# Patient Record
Sex: Female | Born: 1955 | Hispanic: Yes | Marital: Married | Smoking: Never smoker
Health system: Southern US, Community
[De-identification: ages and names within clinical notes are randomized; demographics above are authoritative.]

## PROBLEM LIST (undated history)

## (undated) DIAGNOSIS — I10 Essential (primary) hypertension: Secondary | ICD-10-CM

## (undated) DIAGNOSIS — E119 Type 2 diabetes mellitus without complications: Secondary | ICD-10-CM

## (undated) DIAGNOSIS — M1990 Unspecified osteoarthritis, unspecified site: Secondary | ICD-10-CM

## (undated) DIAGNOSIS — J45909 Unspecified asthma, uncomplicated: Secondary | ICD-10-CM

---

## 2014-11-21 ENCOUNTER — Ambulatory Visit: Payer: Self-pay | Admitting: Family Medicine

## 2014-11-23 ENCOUNTER — Encounter: Payer: Self-pay | Admitting: Family Medicine

## 2014-11-23 ENCOUNTER — Ambulatory Visit (INDEPENDENT_AMBULATORY_CARE_PROVIDER_SITE_OTHER): Payer: BLUE CROSS/BLUE SHIELD | Admitting: Family Medicine

## 2014-11-23 VITALS — BP 124/70 | HR 72 | Ht 61.0 in | Wt 174.6 lb

## 2014-11-23 DIAGNOSIS — R1031 Right lower quadrant pain: Secondary | ICD-10-CM

## 2014-11-23 DIAGNOSIS — J4530 Mild persistent asthma, uncomplicated: Secondary | ICD-10-CM

## 2014-11-23 DIAGNOSIS — E1129 Type 2 diabetes mellitus with other diabetic kidney complication: Secondary | ICD-10-CM | POA: Insufficient documentation

## 2014-11-23 DIAGNOSIS — F32A Depression, unspecified: Secondary | ICD-10-CM | POA: Insufficient documentation

## 2014-11-23 DIAGNOSIS — E785 Hyperlipidemia, unspecified: Secondary | ICD-10-CM | POA: Diagnosis not present

## 2014-11-23 DIAGNOSIS — K219 Gastro-esophageal reflux disease without esophagitis: Secondary | ICD-10-CM | POA: Diagnosis not present

## 2014-11-23 DIAGNOSIS — E119 Type 2 diabetes mellitus without complications: Secondary | ICD-10-CM | POA: Diagnosis not present

## 2014-11-23 DIAGNOSIS — R05 Cough: Secondary | ICD-10-CM

## 2014-11-23 DIAGNOSIS — F329 Major depressive disorder, single episode, unspecified: Secondary | ICD-10-CM

## 2014-11-23 DIAGNOSIS — E669 Obesity, unspecified: Secondary | ICD-10-CM

## 2014-11-23 DIAGNOSIS — R809 Proteinuria, unspecified: Secondary | ICD-10-CM

## 2014-11-23 DIAGNOSIS — K5900 Constipation, unspecified: Secondary | ICD-10-CM | POA: Diagnosis not present

## 2014-11-23 DIAGNOSIS — I10 Essential (primary) hypertension: Secondary | ICD-10-CM | POA: Diagnosis not present

## 2014-11-23 DIAGNOSIS — R053 Chronic cough: Secondary | ICD-10-CM

## 2014-11-23 MED ORDER — ESCITALOPRAM OXALATE 10 MG PO TABS: 10.0000 mg | ORAL_TABLET | Freq: Every day | ORAL | Status: DC

## 2014-11-23 MED ORDER — ALBUTEROL SULFATE HFA 108 (90 BASE) MCG/ACT IN AERS: 2.0000 | INHALATION_SPRAY | RESPIRATORY_TRACT | Status: DC | PRN

## 2014-11-23 MED ORDER — FLUTICASONE-SALMETEROL 250-50 MCG/DOSE IN AEPB: 1.0000 | INHALATION_SPRAY | Freq: Two times a day (BID) | RESPIRATORY_TRACT | Status: DC

## 2014-11-23 NOTE — Patient Instructions (Signed)
Take Prevacid daily. Continue fiber supplement. Labs today.

## 2014-11-23 NOTE — Progress Notes (Signed)
Date:  11/23/2014   Name:  Olivia Ramos   DOB:  Mar 20, 1956   MRN:  [id number]  PCP:  Schuyler Amor, MD    Chief Complaint: Establish Care   History of Present Illness:  This is a 59 y.o. female recently moved here from [place] with asthma well controlled on Advair and prn albuterol (needs refill on both), DM with marginal control, depression improved on Lexapro, and GERD on prn Prevacid. Today c/o RLQ pain for past week, started after lifted box at work, bothers with lifting but also with walking up and down stairs, not progressing. Also has constipation, started a fiber laxative with glass of water a few days ago but not helping yet. Has chronic NP cough past 2 years (on ACEI since 2001) associated with tickle in throat, no fever or hemoptysis.  Review of Systems:  Review of Systems  Constitutional: Negative for unexpected weight change.  Respiratory: Negative for shortness of breath.   Cardiovascular: Negative for chest pain and leg swelling.    Patient Active Problem List   Diagnosis Date Noted  . Type 2 diabetes mellitus without complication 11/23/2014  . Essential hypertension 11/23/2014  . Depression 11/23/2014  . Obesity (BMI 30.0-34.9) 11/23/2014    Prior to Admission medications   Medication Sig Start Date End Date Taking? Authorizing Provider  albuterol (PROVENTIL HFA;VENTOLIN HFA) 108 (90 BASE) MCG/ACT inhaler Inhale 2 puffs into the lungs every 4 (four) hours as needed for wheezing or shortness of breath. 11/23/14  Yes Schuyler Amor, MD  escitalopram (LEXAPRO) 10 MG tablet Take 1 tablet (10 mg total) by mouth daily. 11/23/14  Yes Schuyler Amor, MD  Fluticasone-Salmeterol (ADVAIR) 250-50 MCG/DOSE AEPB Inhale 1 puff into the lungs 2 (two) times daily. 11/23/14  Yes Schuyler Amor, MD  glipiZIDE (GLUCOTROL) 10 MG tablet Take 10 mg by mouth 2 (two) times daily.   Yes Historical Provider, MD  lansoprazole (PREVACID) 30 MG capsule Take 30 mg by mouth daily at 12 noon.   Yes  Historical Provider, MD  lisinopril (PRINIVIL,ZESTRIL) 10 MG tablet Take 10 mg by mouth daily.   Yes Historical Provider, MD  metFORMIN (GLUCOPHAGE) 1000 MG tablet Take 1,000 mg by mouth 2 (two) times daily with a meal.   Yes Historical Provider, MD    No Known Allergies  No past surgical history on file.  Social History  Substance Use Topics  . Smoking status: Never Smoker   . Smokeless tobacco: Not on file  . Alcohol Use: No    No family history on file.  Medication list has been reviewed and updated.  Physical Examination: BP 124/70 mmHg  Pulse 72  Ht 5\' 1"  (1.549 m)  Wt 174 lb 9.6 oz (79.198 kg)  BMI 33.01 kg/m2  Physical Exam  Constitutional: She is oriented to person, place, and time. She appears well-developed and well-nourished.  Cardiovascular: Regular rhythm and normal heart sounds.   Pulmonary/Chest: Effort normal.  Scattered B wheezes  Abdominal: Soft. She exhibits no distension and no mass. There is no tenderness.  Mild tenderness and diffuse bulging with cough over R inguinal ligament  Musculoskeletal: She exhibits no edema.  Neurological: She is alert and oriented to person, place, and time. Coordination normal.  Skin: Skin is warm and dry.  Psychiatric: She has a normal mood and affect. Her behavior is normal.    Assessment and Plan:  1. Type 2 diabetes mellitus without complication Unclear control, continue current regimen - HgB A1c - Urine Microalbumin w/creat. ratio -  Lipid Profile  2. Essential hypertension Well controlled, continue current regimen - Comprehensive metabolic panel - CBC  3. Depression Well controlled, refill Lexapro - TSH - escitalopram (LEXAPRO) 10 MG tablet; Take 1 tablet (10 mg total) by mouth daily.  Dispense: 90 tablet; Refill: 3  4. Obesity (BMI 30.0-34.9) - Vitamin D (25 hydroxy)  5. Gastroesophageal reflux disease, esophagitis presence not specifie  6. Constipation, unspecified constipation type Continue fiber  supplement with glass of water daily  7. Asthma, mild persistent, uncomplicated Marginal control off Advair, refill Advair and albuterol - albuterol (PROVENTIL HFA;VENTOLIN HFA) 108 (90 BASE) MCG/ACT inhaler; Inhale 2 puffs into the lungs every 4 (four) hours as needed for wheezing or shortness of breath.  Dispense: 18 g; Refill: 3 - Fluticasone-Salmeterol (ADVAIR) 250-50 MCG/DOSE AEPB; Inhale 1 puff into the lungs 2 (two) times daily.  Dispense: 60 each; Refill: 5  8. Chronic cough Unclear etiology, may be due to GERD, increase Prevacid to daily, monitor  9. RLQ pain Suspect abdominal strain, doubt hernia, monitor, call if sxs worsen   Return in about 4 weeks (around 12/21/2014).  Dionne Ano. Kingsley Spittle MD [facility]  11/23/2014

## 2014-11-24 LAB — CBC
Hematocrit: 41.9 % (ref 34.0–46.6)
Hemoglobin: 14.5 g/dL (ref 11.1–15.9)
MCH: 30.7 pg (ref 26.6–33.0)
MCHC: 34.6 g/dL (ref 31.5–35.7)
MCV: 89 fL (ref 79–97)
Platelets: 374 10*3/uL (ref 150–379)
RBC: 4.73 x10E6/uL (ref 3.77–5.28)
RDW: 13.2 % (ref 12.3–15.4)
WBC: 9.3 10*3/uL (ref 3.4–10.8)

## 2014-11-24 LAB — HEMOGLOBIN A1C
Est. average glucose Bld gHb Est-mCnc: 235 mg/dL
Hgb A1c MFr Bld: 9.8 % — ABNORMAL HIGH (ref 4.8–5.6)

## 2014-11-24 LAB — COMPREHENSIVE METABOLIC PANEL
ALT: 16 IU/L (ref 0–32)
AST: 16 IU/L (ref 0–40)
Albumin/Globulin Ratio: 1.4 (ref 1.1–2.5)
Albumin: 4.6 g/dL (ref 3.5–5.5)
Alkaline Phosphatase: 94 IU/L (ref 39–117)
BUN/Creatinine Ratio: 29 — ABNORMAL HIGH (ref 9–23)
BUN: 25 mg/dL — ABNORMAL HIGH (ref 6–24)
Bilirubin Total: 0.3 mg/dL (ref 0.0–1.2)
CO2: 21 mmol/L (ref 18–29)
Calcium: 10.1 mg/dL (ref 8.7–10.2)
Chloride: 96 mmol/L — ABNORMAL LOW (ref 97–108)
Creatinine, Ser: 0.87 mg/dL (ref 0.57–1.00)
GFR calc Af Amer: 84 mL/min/{1.73_m2} (ref >59)
GFR calc non Af Amer: 73 mL/min/{1.73_m2} (ref >59)
Globulin, Total: 3.2 g/dL (ref 1.5–4.5)
Glucose: 152 mg/dL — ABNORMAL HIGH (ref 65–99)
Potassium: 4.7 mmol/L (ref 3.5–5.2)
Sodium: 139 mmol/L (ref 134–144)
Total Protein: 7.8 g/dL (ref 6.0–8.5)

## 2014-11-24 LAB — LIPID PANEL
Chol/HDL Ratio: 5.5 ratio units — ABNORMAL HIGH (ref 0.0–4.4)
Cholesterol, Total: 277 mg/dL — ABNORMAL HIGH (ref 100–199)
HDL: 50 mg/dL (ref >39)
Triglycerides: 439 mg/dL — ABNORMAL HIGH (ref 0–149)

## 2014-11-24 LAB — MICROALBUMIN / CREATININE URINE RATIO
Creatinine, Urine: 85.9 mg/dL
MICROALB/CREAT RATIO: 186.4 mg/g creat — ABNORMAL HIGH (ref 0.0–30.0)
Microalbumin, Urine: 160.1 ug/mL

## 2014-11-24 LAB — VITAMIN D 25 HYDROXY (VIT D DEFICIENCY, FRACTURES): Vit D, 25-Hydroxy: 35.4 ng/mL (ref 30.0–100.0)

## 2014-11-24 LAB — TSH: TSH: 2.18 u[IU]/mL (ref 0.450–4.500)

## 2014-11-26 DIAGNOSIS — E785 Hyperlipidemia, unspecified: Secondary | ICD-10-CM | POA: Insufficient documentation

## 2014-11-26 MED ORDER — ATORVASTATIN CALCIUM 20 MG PO TABS: 20.0000 mg | ORAL_TABLET | Freq: Every day | ORAL | Status: DC

## 2014-11-26 NOTE — Addendum Note (Signed)
Addended by: Schuyler Amor on: 11/26/2014 12:23 PM   Modules accepted: Orders

## 2014-12-21 ENCOUNTER — Ambulatory Visit: Payer: BLUE CROSS/BLUE SHIELD | Admitting: Family Medicine

## 2014-12-28 ENCOUNTER — Ambulatory Visit: Payer: BLUE CROSS/BLUE SHIELD | Admitting: Family Medicine

## 2015-01-14 ENCOUNTER — Other Ambulatory Visit: Payer: Self-pay

## 2015-01-14 ENCOUNTER — Telehealth: Payer: Self-pay

## 2015-01-14 MED ORDER — LISINOPRIL 10 MG PO TABS: 10.0000 mg | ORAL_TABLET | Freq: Every day | ORAL | Status: DC

## 2015-01-14 NOTE — Addendum Note (Signed)
Addended by: Schuyler Amor on: 01/14/2015 11:57 AM   Modules accepted: Orders

## 2015-01-14 NOTE — Telephone Encounter (Addendum)
Sent [facility] a message  Lisinopril refilled for one month only, needs office visit

## 2015-01-22 ENCOUNTER — Other Ambulatory Visit: Payer: Self-pay

## 2015-01-22 MED ORDER — LISINOPRIL 10 MG PO TABS: 10.0000 mg | ORAL_TABLET | Freq: Every day | ORAL | Status: DC

## 2015-01-30 ENCOUNTER — Other Ambulatory Visit: Payer: Self-pay | Admitting: Family Medicine

## 2015-01-30 MED ORDER — LANSOPRAZOLE 30 MG PO CPDR: 30.0000 mg | DELAYED_RELEASE_CAPSULE | Freq: Every day | ORAL | Status: DC

## 2015-02-04 ENCOUNTER — Other Ambulatory Visit: Payer: Self-pay

## 2015-02-04 MED ORDER — GLIPIZIDE 10 MG PO TABS: 10.0000 mg | ORAL_TABLET | Freq: Two times a day (BID) | ORAL | Status: DC

## 2015-02-18 ENCOUNTER — Ambulatory Visit (INDEPENDENT_AMBULATORY_CARE_PROVIDER_SITE_OTHER): Payer: BLUE CROSS/BLUE SHIELD | Admitting: Family Medicine

## 2015-02-18 ENCOUNTER — Encounter: Payer: Self-pay | Admitting: Family Medicine

## 2015-02-18 VITALS — BP 128/78 | HR 92 | Ht 61.0 in | Wt 170.8 lb

## 2015-02-18 DIAGNOSIS — R809 Proteinuria, unspecified: Secondary | ICD-10-CM

## 2015-02-18 DIAGNOSIS — F329 Major depressive disorder, single episode, unspecified: Secondary | ICD-10-CM | POA: Diagnosis not present

## 2015-02-18 DIAGNOSIS — Z23 Encounter for immunization: Secondary | ICD-10-CM | POA: Diagnosis not present

## 2015-02-18 DIAGNOSIS — Z79899 Other long term (current) drug therapy: Secondary | ICD-10-CM | POA: Insufficient documentation

## 2015-02-18 DIAGNOSIS — M19071 Primary osteoarthritis, right ankle and foot: Secondary | ICD-10-CM

## 2015-02-18 DIAGNOSIS — Z1239 Encounter for other screening for malignant neoplasm of breast: Secondary | ICD-10-CM

## 2015-02-18 DIAGNOSIS — E669 Obesity, unspecified: Secondary | ICD-10-CM | POA: Diagnosis not present

## 2015-02-18 DIAGNOSIS — K219 Gastro-esophageal reflux disease without esophagitis: Secondary | ICD-10-CM | POA: Diagnosis not present

## 2015-02-18 DIAGNOSIS — E1129 Type 2 diabetes mellitus with other diabetic kidney complication: Secondary | ICD-10-CM | POA: Diagnosis not present

## 2015-02-18 DIAGNOSIS — Z1211 Encounter for screening for malignant neoplasm of colon: Secondary | ICD-10-CM | POA: Diagnosis not present

## 2015-02-18 DIAGNOSIS — E785 Hyperlipidemia, unspecified: Secondary | ICD-10-CM | POA: Diagnosis not present

## 2015-02-18 DIAGNOSIS — I10 Essential (primary) hypertension: Secondary | ICD-10-CM

## 2015-02-18 DIAGNOSIS — Z86010 Personal history of colonic polyps: Secondary | ICD-10-CM | POA: Insufficient documentation

## 2015-02-18 DIAGNOSIS — F32A Depression, unspecified: Secondary | ICD-10-CM

## 2015-02-18 DIAGNOSIS — M19072 Primary osteoarthritis, left ankle and foot: Secondary | ICD-10-CM

## 2015-02-18 MED ORDER — LISINOPRIL 20 MG PO TABS: 20.0000 mg | ORAL_TABLET | Freq: Every day | ORAL | Status: DC

## 2015-02-18 NOTE — Patient Instructions (Signed)
Tylenol extra strength (500 mg) two tablets twice daily as needed for arthritis pain

## 2015-02-18 NOTE — Progress Notes (Signed)
Date:  02/18/2015   Name:  Olivia Ramos   DOB:  06-11-1955   MRN:  [id number]  PCP:  Schuyler AmorWilliam Taffany Heiser, MD    Chief Complaint: Hyperlipidemia; Hypertension; and Diabetes   History of Present Illness:  This is a 59 y.o. female for f/u T2DM, HTN, HLD, and depression. Needs refill lisinopril (dose increased after last visit due to elevated MCR). On long term PPI therapy due to GERD, recurs if stops, no hx EGD. Wants referral for mammogram, colonoscopy (hx colon polyps 10 yrs ago). No recent eye exam. Slight pedal edema with prolonged standing. B 4th/5th toes hurt with prolonged standing. Stop Lexapro but got emotional so restarted. Unsure she wants to take Lipitor due to Alz dementia risk.  Review of Systems:  Review of Systems  Constitutional: Negative for fever and unexpected weight change.  Respiratory: Negative for shortness of breath.   Cardiovascular: Negative for chest pain.  Gastrointestinal: Negative for abdominal pain.  Endocrine: Negative for polyuria.  Genitourinary: Negative for difficulty urinating.  Neurological: Negative for syncope and light-headedness.  Psychiatric/Behavioral: Negative for dysphoric mood.    Patient Active Problem List   Diagnosis Date Noted  . Current use of proton pump inhibitor 02/18/2015  . GERD (gastroesophageal reflux disease) 02/18/2015  . History of colon polyps 02/18/2015  . Osteoarthritis of both feet 02/18/2015  . Hyperlipidemia 11/26/2014  . Diabetes mellitus with microalbuminuria (HCC) 11/23/2014  . Essential hypertension 11/23/2014  . Depression 11/23/2014  . Obesity (BMI 30.0-34.9) 11/23/2014    Prior to Admission medications   Medication Sig Start Date End Date Taking? Authorizing Provider  albuterol (PROVENTIL HFA;VENTOLIN HFA) 108 (90 BASE) MCG/ACT inhaler Inhale 2 puffs into the lungs every 4 (four) hours as needed for wheezing or shortness of breath. 11/23/14  Yes Schuyler AmorWilliam Akil Hoos, MD  atorvastatin (LIPITOR) 20 MG tablet Take 1  tablet (20 mg total) by mouth daily. 11/26/14  Yes Schuyler AmorWilliam Edna Grover, MD  escitalopram (LEXAPRO) 10 MG tablet Take 1 tablet (10 mg total) by mouth daily. 11/23/14  Yes Schuyler AmorWilliam Javaris Wigington, MD  Fluticasone-Salmeterol (ADVAIR) 250-50 MCG/DOSE AEPB Inhale 1 puff into the lungs 2 (two) times daily. 11/23/14  Yes Schuyler AmorWilliam Uva Runkel, MD  glipiZIDE (GLUCOTROL) 10 MG tablet Take 1 tablet (10 mg total) by mouth 2 (two) times daily. 02/04/15  Yes Schuyler AmorWilliam Alhassan Everingham, MD  lansoprazole (PREVACID) 30 MG capsule Take 1 capsule (30 mg total) by mouth daily at 12 noon. 01/30/15  Yes Schuyler AmorWilliam Tywanda Rice, MD  lisinopril (PRINIVIL,ZESTRIL) 20 MG tablet Take 1 tablet (20 mg total) by mouth daily. 02/18/15  Yes Schuyler AmorWilliam Rubylee Zamarripa, MD  metFORMIN (GLUCOPHAGE) 1000 MG tablet Take 1,000 mg by mouth 2 (two) times daily with a meal.   Yes Historical Provider, MD    No Known Allergies  No past surgical history on file.  Social History  Substance Use Topics  . Smoking status: Never Smoker   . Smokeless tobacco: None  . Alcohol Use: No    No family history on file.  Medication list has been reviewed and updated.  Physical Examination: BP 128/78 mmHg  Pulse 92  Ht 5\' 1"  (1.549 m)  Wt 170 lb 12.8 oz (77.474 kg)  BMI 32.29 kg/m2  Physical Exam  Constitutional: She is oriented to person, place, and time. She appears well-developed and well-nourished.  Cardiovascular: Normal rate, regular rhythm and normal heart sounds.   Pulmonary/Chest: Effort normal and breath sounds normal.  Musculoskeletal:  Trace B pedal edema OA changes B feet  Neurological: She is alert and  oriented to person, place, and time.  Skin: Skin is warm and dry.  Psychiatric: She has a normal mood and affect. Her behavior is normal.  Nursing note and vitals reviewed.   Assessment and Plan:  1. Type 2 diabetes mellitus with microalbuminuria, without long-term current use of insulin (HCC) Unclear control, continue current regimen, refill increased dose lisinopril - HgB  A1c - Ambulatory referral to Ophthalmology  2. Essential hypertension Well controlled, refill lisinopril  3. Depression Well controlled, cont Lexapro  4. Obesity (BMI 30.0-34.9) Discussed exercise/weight loss  5. Hyperlipidemia Discussed risks/benefits of Lipitor, she will start  6. Gastroesophageal reflux disease, esophagitis presence not specified On long term PPI therapy, consider EGD - Ambulatory referral to Gastroenterology  7. Osteoarthritis of both feet Trial Tylenol 1000 mg bid prn pain  8. Current use of proton pump inhibitor - B12  9. Flu vaccine need - Flu Vaccine QUAD 36+ mos PF IM (Fluarix & Fluzone Quad PF)  10. Colon cancer screening - Ambulatory referral to Gastroenterology  11. Breast cancer screening - MM Digital Screening; Future  Return in about 3 months (around 05/21/2015).  Dionne Ano. Kingsley Spittle MD [facility]  02/18/2015

## 2015-02-19 LAB — HEMOGLOBIN A1C
Est. average glucose Bld gHb Est-mCnc: 252 mg/dL
Hgb A1c MFr Bld: 10.4 % — ABNORMAL HIGH (ref 4.8–5.6)

## 2015-02-19 LAB — VITAMIN B12: Vitamin B-12: 1050 pg/mL — ABNORMAL HIGH (ref 211–946)

## 2015-02-22 ENCOUNTER — Telehealth: Payer: Self-pay

## 2015-02-22 ENCOUNTER — Other Ambulatory Visit: Payer: Self-pay

## 2015-02-22 MED ORDER — INSULIN GLARGINE 100 UNIT/ML SOLOSTAR PEN: 20.0000 [IU] | PEN_INJECTOR | Freq: Every day | SUBCUTANEOUS | Status: DC

## 2015-02-22 NOTE — Telephone Encounter (Signed)
Sent message to [facility] 

## 2015-02-22 NOTE — Addendum Note (Signed)
Addended by: Schuyler AmorPLONK, Artemis Loyal on: 02/22/2015 04:24 PM   Modules accepted: Orders

## 2015-02-22 NOTE — Telephone Encounter (Signed)
Lantus pen rx sent but unsure which needles she needs, can we call pharmacy?

## 2015-03-18 ENCOUNTER — Other Ambulatory Visit: Payer: Self-pay | Admitting: Family Medicine

## 2015-03-18 MED ORDER — METFORMIN HCL 1000 MG PO TABS: 1000.0000 mg | ORAL_TABLET | Freq: Two times a day (BID) | ORAL | Status: DC

## 2015-05-27 ENCOUNTER — Other Ambulatory Visit: Payer: Self-pay | Admitting: Family Medicine

## 2015-05-27 MED ORDER — LANSOPRAZOLE 30 MG PO CPDR: 30.0000 mg | DELAYED_RELEASE_CAPSULE | Freq: Every day | ORAL | Status: DC

## 2015-06-10 ENCOUNTER — Other Ambulatory Visit: Payer: Self-pay | Admitting: Family Medicine

## 2015-06-10 MED ORDER — ATORVASTATIN CALCIUM 20 MG PO TABS: 20.0000 mg | ORAL_TABLET | Freq: Every day | ORAL | Status: DC

## 2015-07-22 ENCOUNTER — Other Ambulatory Visit: Payer: Self-pay | Admitting: Family Medicine

## 2015-07-22 DIAGNOSIS — J4530 Mild persistent asthma, uncomplicated: Secondary | ICD-10-CM

## 2015-07-22 MED ORDER — ALBUTEROL SULFATE HFA 108 (90 BASE) MCG/ACT IN AERS: 2.0000 | INHALATION_SPRAY | RESPIRATORY_TRACT | Status: DC | PRN

## 2015-08-20 ENCOUNTER — Other Ambulatory Visit: Payer: Self-pay

## 2015-08-20 DIAGNOSIS — J4530 Mild persistent asthma, uncomplicated: Secondary | ICD-10-CM

## 2015-08-20 MED ORDER — ALBUTEROL SULFATE HFA 108 (90 BASE) MCG/ACT IN AERS: 2.0000 | INHALATION_SPRAY | RESPIRATORY_TRACT | Status: DC | PRN

## 2015-08-20 NOTE — Telephone Encounter (Signed)
Daughter called patient in [place] need inhaler

## 2015-09-04 ENCOUNTER — Other Ambulatory Visit: Payer: Self-pay | Admitting: Family Medicine

## 2015-09-04 MED ORDER — LANSOPRAZOLE 30 MG PO CPDR: 30.0000 mg | DELAYED_RELEASE_CAPSULE | Freq: Every day | ORAL | Status: DC

## 2015-09-27 ENCOUNTER — Other Ambulatory Visit: Payer: Self-pay | Admitting: Family Medicine

## 2015-09-27 DIAGNOSIS — J4530 Mild persistent asthma, uncomplicated: Secondary | ICD-10-CM

## 2015-09-27 MED ORDER — FLUTICASONE-SALMETEROL 250-50 MCG/DOSE IN AEPB: 1.0000 | INHALATION_SPRAY | Freq: Two times a day (BID) | RESPIRATORY_TRACT | Status: DC

## 2015-10-09 ENCOUNTER — Other Ambulatory Visit: Payer: Self-pay

## 2015-10-11 ENCOUNTER — Other Ambulatory Visit: Payer: Self-pay | Admitting: Internal Medicine

## 2015-10-11 DIAGNOSIS — J4530 Mild persistent asthma, uncomplicated: Secondary | ICD-10-CM

## 2015-10-11 MED ORDER — ALBUTEROL SULFATE HFA 108 (90 BASE) MCG/ACT IN AERS: 2.0000 | INHALATION_SPRAY | RESPIRATORY_TRACT | Status: DC | PRN

## 2015-10-16 ENCOUNTER — Other Ambulatory Visit: Payer: Self-pay | Admitting: Internal Medicine

## 2015-10-16 MED ORDER — GLIPIZIDE 10 MG PO TABS: 10.0000 mg | ORAL_TABLET | Freq: Two times a day (BID) | ORAL | Status: DC

## 2015-11-30 ENCOUNTER — Other Ambulatory Visit: Payer: Self-pay | Admitting: Internal Medicine

## 2016-01-25 ENCOUNTER — Other Ambulatory Visit: Payer: Self-pay | Admitting: Internal Medicine

## 2016-01-25 DIAGNOSIS — J4530 Mild persistent asthma, uncomplicated: Secondary | ICD-10-CM

## 2016-02-22 ENCOUNTER — Other Ambulatory Visit: Payer: Self-pay | Admitting: Internal Medicine

## 2016-02-22 DIAGNOSIS — J4530 Mild persistent asthma, uncomplicated: Secondary | ICD-10-CM

## 2016-03-23 ENCOUNTER — Other Ambulatory Visit: Payer: Self-pay | Admitting: Family Medicine

## 2016-03-23 MED ORDER — LISINOPRIL 20 MG PO TABS: 20.0000 mg | ORAL_TABLET | Freq: Every day | ORAL | 3 refills | Status: DC

## 2016-03-23 MED ORDER — INSULIN GLARGINE 100 UNIT/ML SOLOSTAR PEN: 20.0000 [IU] | PEN_INJECTOR | Freq: Every day | SUBCUTANEOUS | 0 refills | Status: DC

## 2016-04-19 ENCOUNTER — Other Ambulatory Visit: Payer: Self-pay | Admitting: Family Medicine

## 2016-05-30 ENCOUNTER — Encounter: Payer: Self-pay | Admitting: *Deleted

## 2016-05-30 ENCOUNTER — Ambulatory Visit
Admission: EM | Admit: 2016-05-30 | Discharge: 2016-05-30 | Disposition: A | Discharge status: Home / Self Care | Payer: BLUE CROSS/BLUE SHIELD | Attending: Emergency Medicine | Admitting: Emergency Medicine

## 2016-05-30 ENCOUNTER — Ambulatory Visit (INDEPENDENT_AMBULATORY_CARE_PROVIDER_SITE_OTHER): Payer: BLUE CROSS/BLUE SHIELD

## 2016-05-30 DIAGNOSIS — B9789 Other viral agents as the cause of diseases classified elsewhere: Secondary | ICD-10-CM | POA: Diagnosis not present

## 2016-05-30 DIAGNOSIS — J4530 Mild persistent asthma, uncomplicated: Secondary | ICD-10-CM

## 2016-05-30 DIAGNOSIS — J45901 Unspecified asthma with (acute) exacerbation: Secondary | ICD-10-CM

## 2016-05-30 DIAGNOSIS — J069 Acute upper respiratory infection, unspecified: Secondary | ICD-10-CM

## 2016-05-30 DIAGNOSIS — E1165 Type 2 diabetes mellitus with hyperglycemia: Secondary | ICD-10-CM

## 2016-05-30 DIAGNOSIS — IMO0001 Reserved for inherently not codable concepts without codable children: Secondary | ICD-10-CM

## 2016-05-30 DIAGNOSIS — Z794 Long term (current) use of insulin: Secondary | ICD-10-CM

## 2016-05-30 HISTORY — DX: Essential (primary) hypertension: I10

## 2016-05-30 HISTORY — DX: Type 2 diabetes mellitus without complications: E11.9

## 2016-05-30 HISTORY — DX: Unspecified osteoarthritis, unspecified site: M19.90

## 2016-05-30 LAB — GLUCOSE, CAPILLARY: Glucose-Capillary: 334 mg/dL — ABNORMAL HIGH (ref 65–99)

## 2016-05-30 MED ORDER — FLUTICASONE-SALMETEROL 250-50 MCG/DOSE IN AEPB: 1.0000 | INHALATION_SPRAY | Freq: Two times a day (BID) | RESPIRATORY_TRACT | 0 refills | Status: DC

## 2016-05-30 MED ORDER — ALBUTEROL SULFATE (2.5 MG/3ML) 0.083% IN NEBU
2.5000 mg | INHALATION_SOLUTION | RESPIRATORY_TRACT | 0 refills | Status: DC | PRN
Start: 2016-05-30 — End: 2016-07-02

## 2016-05-30 MED ORDER — ALBUTEROL SULFATE HFA 108 (90 BASE) MCG/ACT IN AERS: 2.0000 | INHALATION_SPRAY | RESPIRATORY_TRACT | 0 refills | Status: DC | PRN

## 2016-05-30 MED ORDER — PREDNISONE 20 MG PO TABS: 40.0000 mg | ORAL_TABLET | Freq: Every day | ORAL | 0 refills | Status: AC

## 2016-05-30 MED ORDER — FREESTYLE SYSTEM KIT: 1.0000 | PACK | Freq: Every day | 0 refills | Status: DC

## 2016-05-30 MED ORDER — AEROCHAMBER PLUS MISC: 2 refills | Status: DC

## 2016-05-30 MED ORDER — GLUCOSE BLOOD VI STRP: ORAL_STRIP | 0 refills | Status: DC

## 2016-05-30 MED ORDER — IPRATROPIUM-ALBUTEROL 0.5-2.5 (3) MG/3ML IN SOLN
3.0000 mL | Freq: Once | RESPIRATORY_TRACT | Status: AC
  Administered 2016-05-30: 3 mL via RESPIRATORY_TRACT

## 2016-05-30 MED ORDER — AZITHROMYCIN 250 MG PO TABS: 250.0000 mg | ORAL_TABLET | Freq: Every day | ORAL | 0 refills | Status: DC

## 2016-05-30 NOTE — Discharge Instructions (Signed)
Keep a log of your blood sugars over the next several days. Go to the ER if your sugars get above 500. Call your doctor on Monday. You may need to have your medications adjusted. 2 puffs from your albuterol inhaler or nebulizer treatment every 4-6 hours as needed for coughing, wheezing, shortness of breath. Finish the steroids and the antibiotics unless your doctor tells you to stop. Go to the ER for the signs and symptoms we discussed.

## 2016-05-30 NOTE — ED Triage Notes (Signed)
Productive cough- green, fever, head and chest congestion, chills, body aches, sore throat, right ear pain, x 1 week.

## 2016-05-30 NOTE — ED Provider Notes (Signed)
HPI  SUBJECTIVE:  Olivia Ramos is a 61 y.o. female who presents with greenish, clear nasal congestion, rhinorrhea, postnasal drip, sinus pressure for the past 8 days. She reports right ear pain that is pressure-like, sore, intermittent lasting minutes with no aggravating or alleviating factors. She reports a cough productive of greenish clear phlegm sinus and nasal congestion. She reports chest tightness with deep inspiration, shortness of breath, wheezing, shortness of breath with exertion and chest soreness secondary to the cough. States that she is unable to sleep secondary to the cough. She denies dental pain, fevers. No ear popping, fullness, change in hearing, otorrhea. No recent swimming, foreign body insertion. No posttussive emesis. She tried Mucinex D, Advair, and her rescue albuterol inhaler. States that she is needing her pro-air 4 times a day normally uses it as needed. She states these have been helping. There are no aggravating factors. no antibiotic in the past month. No antipyretic in the past 6-8 hours. No double sickening, but she states that she is not getting any better. She also reports some polyuria and polydipsia but no unintentional weight loss. States that she hasn't checked her sugars and 6 months due to a nonfunctioning glucometer. She has a past medical history of asthma and has tolerated steroids before. Also history of hypertension. No history of emphysema, COPD, smoking. PMD: Dr. Vicente Masson    Past Medical History:  Diagnosis Date  . Arthritis   . Diabetes mellitus without complication ([place])   . Hypertension     History reviewed. No pertinent surgical history.  History reviewed. No pertinent family history.  Social History  Substance Use Topics  . Smoking status: Never Smoker  . Smokeless tobacco: Never Used  . Alcohol use No    No current facility-administered medications for this encounter.   Current Outpatient Prescriptions:  .  Insulin Glargine (LANTUS)  100 UNIT/ML Solostar Pen, Inject 20 Units into the skin daily at 10 pm., Disp: 15 mL, Rfl: 0 .  lisinopril (PRINIVIL,ZESTRIL) 20 MG tablet, Take 1 tablet (20 mg total) by mouth daily., Disp: 90 tablet, Rfl: 3 .  metFORMIN (GLUCOPHAGE) 1000 MG tablet, TAKE 1 TABLET (1,000 MG TOTAL) BY MOUTH 2 (TWO) TIMES DAILY WITH A MEAL., Disp: 180 tablet, Rfl: 0 .  albuterol (PROVENTIL HFA;VENTOLIN HFA) 108 (90 Base) MCG/ACT inhaler, Inhale 2 puffs into the lungs every 4 (four) hours as needed for wheezing or shortness of breath., Disp: 18 g, Rfl: 0 .  albuterol (PROVENTIL) (2.5 MG/3ML) 0.083% nebulizer solution, Take 3 mLs (2.5 mg total) by nebulization every 4 (four) hours as needed for wheezing or shortness of breath., Disp: 75 mL, Rfl: 0 .  atorvastatin (LIPITOR) 20 MG tablet, Take 1 tablet (20 mg total) by mouth daily., Disp: 90 tablet, Rfl: 3 .  azithromycin (ZITHROMAX) 250 MG tablet, Take 1 tablet (250 mg total) by mouth daily. 2 tabs po on day 1, 1 tab po on days 2-5, Disp: 6 tablet, Rfl: 0 .  escitalopram (LEXAPRO) 10 MG tablet, Take 1 tablet (10 mg total) by mouth daily., Disp: 90 tablet, Rfl: 3 .  Fluticasone-Salmeterol (ADVAIR) 250-50 MCG/DOSE AEPB, Inhale 1 puff into the lungs 2 (two) times daily., Disp: 60 each, Rfl: 0 .  glipiZIDE (GLUCOTROL) 10 MG tablet, Take 1 tablet (10 mg total) by mouth 2 (two) times daily., Disp: 60 tablet, Rfl: 0 .  glucose blood test strip, Check your sugar in the morning before you eat breakfast, and one hour after a meal., Disp: 100 each, Rfl:  0 .  glucose monitoring kit (FREESTYLE) monitoring kit, 1 each by Does not apply route daily. Check glucose once in the morning before breakfast and 1 hour after a meal, Disp: 1 each, Rfl: 0 .  lansoprazole (PREVACID) 30 MG capsule, Take 1 capsule (30 mg total) by mouth daily at 12 noon., Disp: 30 capsule, Rfl: 2 .  predniSONE (DELTASONE) 20 MG tablet, Take 2 tablets (40 mg total) by mouth daily with breakfast., Disp: 10 tablet, Rfl:  0 .  Spacer/Aero-Holding Chambers (AEROCHAMBER PLUS) inhaler, Use as instructed, Disp: 1 each, Rfl: 2  No Known Allergies   ROS  As noted in HPI.   Physical Exam  BP 127/76 (BP Location: Left Arm)   Pulse 94   Temp 98.3 F (36.8 C) (Oral)   Resp 16   Ht 5' (1.524 m)   Wt 177 lb (80.3 kg)   SpO2 96%   BMI 34.57 kg/m   Constitutional: Well developed, well nourished, no acute distress Eyes:  EOMI, conjunctiva normal bilaterally HENT: Normocephalic, atraumatic,mucus membranes moist TMs normal bilaterally.. Clear rhinorrhea. Normal turbinates. No sinus tenderness. Normal oropharynx. Positive postnasal drip. Respiratory: Normal inspiratory effort. diffuse expiratory wheezing throughout, loudest in the left lower lobe. No rales or rhonchi. Cardiovascular: Normal rate regular rhythm no murmurs rubs gallops GI: nondistended skin: No rash, skin intact Musculoskeletal: no deformities Neurologic: Alert & oriented x 3, no focal neuro deficits Psychiatric: Speech and behavior appropriate   ED Course   Medications  ipratropium-albuterol (DUONEB) 0.5-2.5 (3) MG/3ML nebulizer solution 3 mL (3 mLs Nebulization Given 05/30/16 1459)    Orders Placed This Encounter  Procedures  . DG Chest 2 View    Standing Status:   Standing    Number of Occurrences:   1    Order Specific Question:   Reason for Exam (SYMPTOM  OR DIAGNOSIS REQUIRED)    Answer:   cough x 8 days r/o PNA  . Glucose, capillary    Standing Status:   Standing    Number of Occurrences:   1  . CBG monitoring, ED    Standing Status:   Standing    Number of Occurrences:   1    Results for orders placed or performed during the hospital encounter of 05/30/16 (from the past 24 hour(s))  Glucose, capillary     Status: Abnormal   Collection Time: 05/30/16  2:57 PM  Result Value Ref Range   Glucose-Capillary 334 (H) 65 - 99 mg/dL   Dg Chest 2 View  Result Date: 05/30/2016 CLINICAL DATA:  Chest congestion and cough for 8  days. EXAM: CHEST  2 VIEW COMPARISON:  None. FINDINGS: The cardiomediastinal silhouette is unremarkable. There is no evidence of focal airspace disease, pulmonary edema, suspicious pulmonary nodule/mass, pleural effusion, or pneumothorax. No acute bony abnormalities are identified. IMPRESSION: No active cardiopulmonary disease. Electronically Signed   By: Margarette Canada M.D.   On: 05/30/2016 16:03    ED Clinical Impression  Viral upper respiratory tract infection  Mild asthma with exacerbation, unspecified whether persistent  Uncontrolled type 2 diabetes mellitus without complication, with long-term current use of insulin (HCC)  Mild persistent asthma without complication - Plan: albuterol (PROVENTIL HFA;VENTOLIN HFA) 108 (90 Base) MCG/ACT inhaler, Fluticasone-Salmeterol (ADVAIR) 250-50 MCG/DOSE AEPB  ED Assessment/Plan  Giving DuoNeb. Checking chest x-ray given that she has some focal lung findings. Also checking glucose she states that she has not done this in over 6 months. Discussed with her the importance of getting a working  glucometer and checking her sugar on a regular basis particularly since she takes insulin. Plan to send home with pro-air with a spacer, refill her Advair and her albuterol solution. Also sending home with azithromycin, prednisone 40 mg for 5 days because  I think that this is an asthma exacerbation as well, and advised her that this will increase her sugars, She will keep a log of her blood sugar, she needs to follow-up with Dr. Vicente Masson on Monday for medication adjustment., she will go to the ER she gets worse.  glucose noted. This is most likely chronic. Doubt DKA. Previous records reviewed.  Most recent glucose was done in August 2016. Patient has a history of poorly controlled diabetes her last A1c was 10.4 in November 2016.   On repeat Evaluation, patient states that she feels significantly better. She has persistent wheezing throughout all lung fields, but improved air  movement. Still no rales or rhonchi. Plan as above.   Discussed labs, imaging, MDM, plan and followup with patient. Discussed sn/sx that should prompt return to the ED. Patient agrees with plan.   Meds ordered this encounter  Medications  . ipratropium-albuterol (DUONEB) 0.5-2.5 (3) MG/3ML nebulizer solution 3 mL  . albuterol (PROVENTIL HFA;VENTOLIN HFA) 108 (90 Base) MCG/ACT inhaler    Sig: Inhale 2 puffs into the lungs every 4 (four) hours as needed for wheezing or shortness of breath.    Dispense:  18 g    Refill:  0    Needs office visit before further refills.  . Fluticasone-Salmeterol (ADVAIR) 250-50 MCG/DOSE AEPB    Sig: Inhale 1 puff into the lungs 2 (two) times daily.    Dispense:  60 each    Refill:  0  . azithromycin (ZITHROMAX) 250 MG tablet    Sig: Take 1 tablet (250 mg total) by mouth daily. 2 tabs po on day 1, 1 tab po on days 2-5    Dispense:  6 tablet    Refill:  0  . Spacer/Aero-Holding Chambers (AEROCHAMBER PLUS) inhaler    Sig: Use as instructed    Dispense:  1 each    Refill:  2  . albuterol (PROVENTIL) (2.5 MG/3ML) 0.083% nebulizer solution    Sig: Take 3 mLs (2.5 mg total) by nebulization every 4 (four) hours as needed for wheezing or shortness of breath.    Dispense:  75 mL    Refill:  0  . predniSONE (DELTASONE) 20 MG tablet    Sig: Take 2 tablets (40 mg total) by mouth daily with breakfast.    Dispense:  10 tablet    Refill:  0  . glucose blood test strip    Sig: Check your sugar in the morning before you eat breakfast, and one hour after a meal.    Dispense:  100 each    Refill:  0  . glucose monitoring kit (FREESTYLE) monitoring kit    Sig: 1 each by Does not apply route daily. Check glucose once in the morning before breakfast and 1 hour after a meal    Dispense:  1 each    Refill:  0    *This clinic note was created using [employer]. Therefore, there may be occasional mistakes despite careful proofreading.  ?   Melynda Ripple, MD 05/30/16 [id number]

## 2016-06-03 ENCOUNTER — Other Ambulatory Visit: Payer: Self-pay | Admitting: Family Medicine

## 2016-07-02 ENCOUNTER — Encounter: Payer: Self-pay | Admitting: Family Medicine

## 2016-07-02 ENCOUNTER — Ambulatory Visit (INDEPENDENT_AMBULATORY_CARE_PROVIDER_SITE_OTHER): Payer: BLUE CROSS/BLUE SHIELD | Admitting: Family Medicine

## 2016-07-02 VITALS — BP 162/98 | HR 82 | Resp 16 | Ht 60.0 in | Wt 176.0 lb

## 2016-07-02 DIAGNOSIS — E669 Obesity, unspecified: Secondary | ICD-10-CM

## 2016-07-02 DIAGNOSIS — E785 Hyperlipidemia, unspecified: Secondary | ICD-10-CM | POA: Diagnosis not present

## 2016-07-02 DIAGNOSIS — E1165 Type 2 diabetes mellitus with hyperglycemia: Secondary | ICD-10-CM

## 2016-07-02 DIAGNOSIS — Z86010 Personal history of colonic polyps: Secondary | ICD-10-CM

## 2016-07-02 DIAGNOSIS — E0842 Diabetes mellitus due to underlying condition with diabetic polyneuropathy: Secondary | ICD-10-CM

## 2016-07-02 DIAGNOSIS — IMO0001 Reserved for inherently not codable concepts without codable children: Secondary | ICD-10-CM

## 2016-07-02 DIAGNOSIS — I10 Essential (primary) hypertension: Secondary | ICD-10-CM | POA: Diagnosis not present

## 2016-07-02 DIAGNOSIS — J4540 Moderate persistent asthma, uncomplicated: Secondary | ICD-10-CM | POA: Diagnosis not present

## 2016-07-02 DIAGNOSIS — J45909 Unspecified asthma, uncomplicated: Secondary | ICD-10-CM | POA: Insufficient documentation

## 2016-07-02 DIAGNOSIS — K5900 Constipation, unspecified: Secondary | ICD-10-CM | POA: Diagnosis not present

## 2016-07-02 LAB — GLUCOSE, POCT (MANUAL RESULT ENTRY): POC Glucose: 156 mg/dl — AB (ref 70–99)

## 2016-07-02 MED ORDER — GABAPENTIN 100 MG PO CAPS: 100.0000 mg | ORAL_CAPSULE | Freq: Every day | ORAL | 2 refills | Status: DC

## 2016-07-02 MED ORDER — SENNOSIDES-DOCUSATE SODIUM 8.6-50 MG PO TABS: 1.0000 | ORAL_TABLET | Freq: Two times a day (BID) | ORAL | Status: DC | PRN

## 2016-07-02 NOTE — Patient Instructions (Signed)
Take gabapentin for your foot pain and OTC Senokot-S for your constipation.

## 2016-07-02 NOTE — Progress Notes (Signed)
Date:  07/02/2016   Name:  Olivia Ramos   DOB:  15-Nov-1955   MRN:  [id number]  PCP:  Adline Potter, MD    Chief Complaint: Diabetes (Increased thirst. Sugar elevated went to [facility] BS read HIGH and now stays in 200. Wants to change meds. ); Hypertension (refills); and Asthma (refills)   History of Present Illness:  This is a 61 y.o. female seen in 36 month f/u. T2DM started back on Lantus after last visit, still on metformin, BG's remaining over 200. C/o numbness both feet, pain with prolonged standing. Seen MUC last month with asthma excerbation, placed on azith and prednisone, residual cough but otherwise much better, still using Advair bid and Proventil MDI prn. HTN on lisinopril, off Lipitor, Lexapro, glipizide, and Prevacid as rxs ran out, uses OTC Pepcid prn indigestion. C/o constipation, prune juice no help, never saw GI. Also c/o decreased VA, last saw optho 1 year ago.   Review of Systems:  Review of Systems  Constitutional: Negative for chills and fever.  Respiratory: Negative for shortness of breath.   Cardiovascular: Negative for chest pain, palpitations and leg swelling.  Gastrointestinal: Negative for abdominal pain and blood in stool.  Endocrine: Negative for polydipsia and polyuria.  Genitourinary: Negative for dysuria.  Neurological: Negative for tremors, syncope and light-headedness.    Patient Active Problem List   Diagnosis Date Noted  . Constipation 07/02/2016  . Current use of proton pump inhibitor 02/18/2015  . GERD (gastroesophageal reflux disease) 02/18/2015  . History of colon polyps 02/18/2015  . Osteoarthritis of both feet 02/18/2015  . Hyperlipidemia 11/26/2014  . Diabetes mellitus with microalbuminuria ([place]) 11/23/2014  . Essential hypertension 11/23/2014  . Depression 11/23/2014  . Obesity (BMI 30.0-34.9) 11/23/2014    Prior to Admission medications   Medication Sig Start Date End Date Taking? Authorizing Provider  albuterol (PROVENTIL  HFA;VENTOLIN HFA) 108 (90 Base) MCG/ACT inhaler Inhale 2 puffs into the lungs every 4 (four) hours as needed for wheezing or shortness of breath. 05/30/16  Yes Melynda Ripple, MD  famotidine-calcium carbonate-magnesium hydroxide (PEPCID COMPLETE) 10-800-165 MG chewable tablet Chew 1 tablet by mouth daily as needed.   Yes Historical Provider, MD  Fluticasone-Salmeterol (ADVAIR) 250-50 MCG/DOSE AEPB Inhale 1 puff into the lungs 2 (two) times daily. 05/30/16  Yes Melynda Ripple, MD  glucose blood test strip Check your sugar in the morning before you eat breakfast, and one hour after a meal. 05/30/16  Yes Melynda Ripple, MD  glucose monitoring kit (FREESTYLE) monitoring kit 1 each by Does not apply route daily. Check glucose once in the morning before breakfast and 1 hour after a meal 05/30/16  Yes Melynda Ripple, MD  LANTUS SOLOSTAR 100 UNIT/ML Solostar Pen INJECT 20 UNITS INTO THE SKIN DAILY AT 10 PM. 06/03/16  Yes Adline Potter, MD  lisinopril (PRINIVIL,ZESTRIL) 20 MG tablet Take 1 tablet (20 mg total) by mouth daily. 03/23/16  Yes Adline Potter, MD  metFORMIN (GLUCOPHAGE) 1000 MG tablet TAKE 1 TABLET (1,000 MG TOTAL) BY MOUTH 2 (TWO) TIMES DAILY WITH A MEAL. 04/20/16  Yes Adline Potter, MD  Spacer/Aero-Holding Chambers (AEROCHAMBER PLUS) inhaler Use as instructed 05/30/16  Yes Melynda Ripple, MD  gabapentin (NEURONTIN) 100 MG capsule Take 1 capsule (100 mg total) by mouth at bedtime. 07/02/16   Adline Potter, MD  senna-docusate (SENOKOT S) 8.6-50 MG tablet Take 1 tablet by mouth 2 (two) times daily as needed for mild constipation. 07/02/16   Adline Potter, MD    No Known Allergies  History reviewed. No pertinent surgical history.  Social History  Substance Use Topics  . Smoking status: Never Smoker  . Smokeless tobacco: Never Used  . Alcohol use No    History reviewed. No pertinent family history.  Medication list has been reviewed and updated.  Physical Examination: BP (!) 162/98    Pulse 82   Resp 16   Ht 5' (1.524 m)   Wt 176 lb (79.8 kg)   SpO2 96%   BMI 34.37 kg/m   Physical Exam  Constitutional: She is oriented to person, place, and time. She appears well-developed and well-nourished.  Cardiovascular: Normal rate, regular rhythm, normal heart sounds and intact distal pulses.   Pulmonary/Chest: Effort normal and breath sounds normal.  Abdominal: Soft. She exhibits no distension and no mass. There is no tenderness.  Musculoskeletal: She exhibits no edema.  Neurological: She is alert and oriented to person, place, and time.  Skin: Skin is warm and dry.  Psychiatric: She has a normal mood and affect. Her behavior is normal.  Nursing note and vitals reviewed.   Assessment and Plan:  1. Uncontrolled diabetes mellitus type 2 without complications, unspecified long term insulin use status (HCC) Unclear control (FSBG 156 today) - POCT Glucose (CBG) - HgB A1c - Urine Microalbumin w/creat. ratio - Ambulatory referral to Ophthalmology  2. Diabetic polyneuropathy associated with diabetes mellitus due to underlying condition ([place]) New onset, trial gabapentin 100 mg qhs  3. Essential hypertension Poor control today, consider increased lisinopril dose or add HCTZ/amlodipine next visit - Comprehensive Metabolic Panel (CMET) - CBC  4. Hyperlipidemia, unspecified hyperlipidemia type Off Lipitor - Lipid Profile  5. Constipation, unspecified constipation type Trial OTC Senokot, needs colonoscopy - Ambulatory referral to Gastroenterology  6. Obesity (BMI 30.0-34.9) Exercise/weight loss discussed  7. History of colon polyps  8. Asthma Improved s/p recent exacerbation, cont Advair/albuterol prn  9. Med review Stay off Lipitor/Lexapro/glipizide/Prevacid for now  Return in about 1 week (around 07/09/2016).  Satira Anis. [facility]  07/02/2016

## 2016-07-03 LAB — MICROALBUMIN / CREATININE URINE RATIO
Creatinine, Urine: 74.6 mg/dL
Microalb/Creat Ratio: 131.9 mg/g creat — ABNORMAL HIGH (ref 0.0–30.0)
Microalbumin, Urine: 98.4 ug/mL

## 2016-07-03 LAB — COMPREHENSIVE METABOLIC PANEL
ALT: 13 IU/L (ref 0–32)
AST: 16 IU/L (ref 0–40)
Albumin/Globulin Ratio: 1.4 (ref 1.2–2.2)
Albumin: 4.2 g/dL (ref 3.6–4.8)
Alkaline Phosphatase: 81 IU/L (ref 39–117)
BUN/Creatinine Ratio: 24 (ref 12–28)
BUN: 20 mg/dL (ref 8–27)
Bilirubin Total: 0.3 mg/dL (ref 0.0–1.2)
CO2: 23 mmol/L (ref 18–29)
Calcium: 9.7 mg/dL (ref 8.7–10.3)
Chloride: 98 mmol/L (ref 96–106)
Creatinine, Ser: 0.84 mg/dL (ref 0.57–1.00)
GFR calc Af Amer: 87 mL/min/{1.73_m2} (ref >59)
GFR calc non Af Amer: 75 mL/min/{1.73_m2} (ref >59)
Globulin, Total: 3 g/dL (ref 1.5–4.5)
Glucose: 185 mg/dL — ABNORMAL HIGH (ref 65–99)
Potassium: 4.7 mmol/L (ref 3.5–5.2)
Sodium: 138 mmol/L (ref 134–144)
Total Protein: 7.2 g/dL (ref 6.0–8.5)

## 2016-07-03 LAB — LIPID PANEL
Chol/HDL Ratio: 4.3 ratio units (ref 0.0–4.4)
Cholesterol, Total: 218 mg/dL — ABNORMAL HIGH (ref 100–199)
HDL: 51 mg/dL (ref >39)
LDL Calculated: 113 mg/dL — ABNORMAL HIGH (ref 0–99)
Triglycerides: 269 mg/dL — ABNORMAL HIGH (ref 0–149)
VLDL Cholesterol Cal: 54 mg/dL — ABNORMAL HIGH (ref 5–40)

## 2016-07-03 LAB — CBC
Hematocrit: 40.5 % (ref 34.0–46.6)
Hemoglobin: 13.9 g/dL (ref 11.1–15.9)
MCH: 30.2 pg (ref 26.6–33.0)
MCHC: 34.3 g/dL (ref 31.5–35.7)
MCV: 88 fL (ref 79–97)
Platelets: 347 10*3/uL (ref 150–379)
RBC: 4.6 x10E6/uL (ref 3.77–5.28)
RDW: 13.4 % (ref 12.3–15.4)
WBC: 6.6 10*3/uL (ref 3.4–10.8)

## 2016-07-03 LAB — HEMOGLOBIN A1C
Est. average glucose Bld gHb Est-mCnc: 246 mg/dL
Hgb A1c MFr Bld: 10.2 % — ABNORMAL HIGH (ref 4.8–5.6)

## 2016-07-06 ENCOUNTER — Other Ambulatory Visit: Payer: Self-pay | Admitting: Family Medicine

## 2016-07-06 MED ORDER — LISINOPRIL 40 MG PO TABS: 40.0000 mg | ORAL_TABLET | Freq: Every day | ORAL | 3 refills | Status: DC

## 2016-07-06 MED ORDER — INSULIN GLARGINE 100 UNIT/ML SOLOSTAR PEN: 30.0000 [IU] | PEN_INJECTOR | Freq: Every day | SUBCUTANEOUS | 3 refills | Status: DC

## 2016-07-09 ENCOUNTER — Ambulatory Visit: Payer: BLUE CROSS/BLUE SHIELD | Admitting: Family Medicine

## 2016-07-16 ENCOUNTER — Ambulatory Visit (INDEPENDENT_AMBULATORY_CARE_PROVIDER_SITE_OTHER): Payer: BLUE CROSS/BLUE SHIELD | Admitting: Family Medicine

## 2016-07-16 ENCOUNTER — Encounter: Payer: Self-pay | Admitting: Family Medicine

## 2016-07-16 VITALS — BP 128/86 | HR 81 | Resp 16 | Ht 61.0 in | Wt 174.6 lb

## 2016-07-16 DIAGNOSIS — K5901 Slow transit constipation: Secondary | ICD-10-CM

## 2016-07-16 DIAGNOSIS — I10 Essential (primary) hypertension: Secondary | ICD-10-CM

## 2016-07-16 DIAGNOSIS — K219 Gastro-esophageal reflux disease without esophagitis: Secondary | ICD-10-CM | POA: Diagnosis not present

## 2016-07-16 DIAGNOSIS — F40243 Fear of flying: Secondary | ICD-10-CM

## 2016-07-16 DIAGNOSIS — R809 Proteinuria, unspecified: Secondary | ICD-10-CM

## 2016-07-16 DIAGNOSIS — E1129 Type 2 diabetes mellitus with other diabetic kidney complication: Secondary | ICD-10-CM

## 2016-07-16 DIAGNOSIS — M7750 Other enthesopathy of unspecified foot: Secondary | ICD-10-CM

## 2016-07-16 MED ORDER — LORAZEPAM 0.5 MG PO TABS: 0.5000 mg | ORAL_TABLET | Freq: Three times a day (TID) | ORAL | 0 refills | Status: DC | PRN

## 2016-07-16 MED ORDER — NAPROXEN 500 MG PO TABS: 500.0000 mg | ORAL_TABLET | Freq: Two times a day (BID) | ORAL | 0 refills | Status: DC

## 2016-07-16 NOTE — Patient Instructions (Signed)
Use OTC Senokot as needed for constipation.   Constipacin en los adultos (Constipation, Adult) Constipacin significa que una persona defeca en una semana menos que lo normal, hay dificultad para defecar, o las heces son secas, duras, o ms grandes que lo normal. La causa puede ser una afeccin subyacente. Puede empeorar con la edad si una persona toma ciertos medicamentos y no toma suficiente lquido. INSTRUCCIONES PARA EL CUIDADO EN EL HOGAR Comida y bebida  Consuma alimentos con alto contenido de [place], como frutas y verduras frescas, cereales integrales y frijoles.  Limite los alimentos ricos en grasas y con bajo contenido de [place], o muy procesados, como las papas fritas, [place], [place], dulces y refrescos.  Beba suficiente lquido para Photographer orina clara o de color amarillo plido. Instrucciones generales  Haga actividad fsica habitualmente o como se lo haya indicado el mdico.  Vaya al bao cuando sienta la necesidad de ir. No se aguante las ganas.  Tome los medicamentos de venta libre y los recetados solamente como se lo haya indicado el mdico. Estos incluyen los suplementos de [place].  Practique ejercicios de rehabilitacin del suelo plvico, como la respiracin profunda mientras relaja la parte inferior del abdomen y relajacin del suelo plvico mientras defeca.  Controle su afeccin para ver si hay cambios.  Concurra a todas las visitas de control como se lo haya indicado el mdico. Esto es importante. SOLICITE ATENCIN MDICA SI:  Siente un dolor que empeora.  Tiene fiebre.  No defeca despus de 4das.  Vomita.  No tiene hambre.  Pierde peso.  Tiene una hemorragia en el ano.  Las heces son delgadas como un lpiz. SOLICITE ATENCIN MDICA DE INMEDIATO SI:  Tiene fiebre y los sntomas empeoran repentinamente.  Observa que se filtran heces o hay sangre en las heces.  Tiene el abdomen hinchado.  Siente un dolor intenso en el abdomen.  Se  siente mareado o se desmaya. Esta informacin no tiene Theme park manager el consejo del mdico. Asegrese de hacerle al mdico cualquier pregunta que tenga. Document Released: 04/12/2007 Document Revised: 04/13/2014 Document Reviewed: 09/11/2015 Elsevier Interactive Patient Education  2017 [employer].

## 2016-07-16 NOTE — Progress Notes (Signed)
Date:  07/16/2016   Name:  Olivia Ramos   DOB:  11-08-1955   MRN:  [id number]  PCP:  Adline Potter, MD    Chief Complaint: Diabetes (BS 136 avg at home )   History of Present Illness:  This is a 61 y.o. female seen in two week f/u. Lantus and lisinopril doses increased after last visit due to elevated a1c and MCR. Pt states she feels better, BG 136 this AM. Still c/o constipation, has not tried Senokot. C/o R lateral heel pain past 4 months since using shoes that were too loose, not improving, asa helps. Also flying to [place] tomorrow, requests anxiety med for flight.    Review of Systems:  Review of Systems  Constitutional: Negative for chills and fever.  Respiratory: Negative for cough and shortness of breath.   Cardiovascular: Negative for chest pain and leg swelling.  Endocrine: Negative for polydipsia and polyuria.  Genitourinary: Negative for dysuria.  Neurological: Negative for syncope and light-headedness.    Patient Active Problem List   Diagnosis Date Noted  . Constipation 07/02/2016  . Asthma 07/02/2016  . GERD (gastroesophageal reflux disease) 02/18/2015  . History of colon polyps 02/18/2015  . Osteoarthritis of both feet 02/18/2015  . Hyperlipidemia 11/26/2014  . 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 Essential hypertension 11/23/2014  . Depression 11/23/2014  . Obesity (BMI 30.0-34.9) 11/23/2014    Prior to Admission medications   Medication Sig Start Date End Date Taking? Authorizing Provider  albuterol (PROVENTIL HFA;VENTOLIN HFA) 108 (90 Base) MCG/ACT inhaler Inhale 2 puffs into the lungs every 4 (four) hours as needed for wheezing or shortness of breath. 05/30/16  Yes Melynda Ripple, MD  famotidine-calcium carbonate-magnesium hydroxide (PEPCID COMPLETE) 10-800-165 MG chewable tablet Chew 1 tablet by mouth daily as needed.   Yes Historical Provider, MD  Fluticasone-Salmeterol (ADVAIR) 250-50 MCG/DOSE AEPB Inhale 1 puff into the lungs 2  (two) times daily. 05/30/16  Yes Melynda Ripple, MD  gabapentin (NEURONTIN) 100 MG capsule Take 1 capsule (100 mg total) by mouth at bedtime. 07/02/16  Yes Adline Potter, MD  glucose blood test strip Check your sugar in the morning before you eat breakfast, and one hour after a meal. 05/30/16  Yes Melynda Ripple, MD  glucose monitoring kit (FREESTYLE) monitoring kit 1 each by Does not apply route daily. Check glucose once in the morning before breakfast and 1 hour after a meal 05/30/16  Yes Melynda Ripple, MD  Insulin Glargine (LANTUS SOLOSTAR) 100 UNIT/ML Solostar Pen Inject 30 Units into the skin daily at 10 pm. 07/06/16  Yes Adline Potter, MD  lisinopril (PRINIVIL,ZESTRIL) 40 MG tablet Take 1 tablet (40 mg total) by mouth daily. 07/06/16  Yes Adline Potter, MD  metFORMIN (GLUCOPHAGE) 1000 MG tablet TAKE 1 TABLET (1,000 MG TOTAL) BY MOUTH 2 (TWO) TIMES DAILY WITH A MEAL. 04/20/16  Yes Adline Potter, MD  senna-docusate (SENOKOT S) 8.6-50 MG tablet Take 1 tablet by mouth 2 (two) times daily as needed for mild constipation. 07/02/16  Yes Adline Potter, MD  Spacer/Aero-Holding Chambers (AEROCHAMBER PLUS) inhaler Use as instructed 05/30/16  Yes Melynda Ripple, MD  LORazepam (ATIVAN) 0.5 MG tablet Take 1 tablet (0.5 mg total) by mouth every 8 (eight) hours as needed for anxiety (airplane flight). 07/16/16   Adline Potter, MD  naproxen (NAPROSYN) 500 MG tablet Take 1 tablet (500 mg total) by mouth 2 (two) times daily with a meal. 07/16/16   Adline Potter, MD    No Known Allergies  History  reviewed. No pertinent surgical history.  Social History  Substance Use Topics  . Smoking status: Never Smoker  . Smokeless tobacco: Never Used  . Alcohol use No    History reviewed. No pertinent family history.  Medication list has been reviewed and updated.  Physical Examination: BP 128/86   Pulse 81   Resp 16   Ht _0  (1.549 m)   Wt 174 lb 9.6 oz (79.2 kg)   SpO2 95%   BMI 32.99 kg/m   Physical Exam   Constitutional: She appears well-developed and well-nourished.  Cardiovascular: Normal rate, regular rhythm and normal heart sounds.   Pulmonary/Chest: Effort normal and breath sounds normal.  Musculoskeletal: She exhibits no edema.  Mildly tender over R distal fibula, no deformity  Neurological: She is alert.  Skin: Skin is warm and dry.  Psychiatric: She has a normal mood and affect. Her behavior is normal.  Nursing note and vitals reviewed.   Assessment and Plan:  1. Diabetes mellitus with microalbuminuria (HCC) BG's improved on increased Lantus  2. Essential hypertension Improved with increased lisinopril  3. Slow transit constipation Use OTC Senokot prn  4. Gastroesophageal reflux disease, esophagitis presence not specified Cont Pepcid Complete prn  5. Tendonitis of ankle or foot Naprosyn 500 mg bid x 2 wks only  6. Flying phobia Ativan prn flight only  Return in about 3 months (around 10/15/2016).  Satira Anis. [facility]  07/16/2016

## 2016-07-24 ENCOUNTER — Other Ambulatory Visit: Payer: Self-pay | Admitting: Family Medicine

## 2016-07-24 MED ORDER — METFORMIN HCL 1000 MG PO TABS: 1000.0000 mg | ORAL_TABLET | Freq: Two times a day (BID) | ORAL | 3 refills | Status: DC

## 2016-07-31 ENCOUNTER — Other Ambulatory Visit: Payer: Self-pay | Admitting: Family Medicine

## 2016-07-31 MED ORDER — GLUCOSE BLOOD VI STRP: ORAL_STRIP | 12 refills | Status: DC

## 2016-07-31 NOTE — Progress Notes (Signed)
Test strips refilled

## 2016-08-03 ENCOUNTER — Other Ambulatory Visit: Payer: Self-pay

## 2016-08-03 ENCOUNTER — Ambulatory Visit: Payer: BLUE CROSS/BLUE SHIELD | Admitting: Gastroenterology

## 2016-08-21 ENCOUNTER — Ambulatory Visit (INDEPENDENT_AMBULATORY_CARE_PROVIDER_SITE_OTHER): Payer: BLUE CROSS/BLUE SHIELD | Admitting: Family Medicine

## 2016-08-21 ENCOUNTER — Encounter: Payer: Self-pay | Admitting: Family Medicine

## 2016-08-21 VITALS — BP 124/84 | HR 90 | Resp 16 | Ht 61.0 in | Wt 178.0 lb

## 2016-08-21 DIAGNOSIS — R300 Dysuria: Secondary | ICD-10-CM | POA: Diagnosis not present

## 2016-08-21 DIAGNOSIS — E1129 Type 2 diabetes mellitus with other diabetic kidney complication: Secondary | ICD-10-CM

## 2016-08-21 DIAGNOSIS — E1142 Type 2 diabetes mellitus with diabetic polyneuropathy: Secondary | ICD-10-CM

## 2016-08-21 DIAGNOSIS — R809 Proteinuria, unspecified: Secondary | ICD-10-CM

## 2016-08-21 DIAGNOSIS — I10 Essential (primary) hypertension: Secondary | ICD-10-CM | POA: Diagnosis not present

## 2016-08-21 DIAGNOSIS — E1140 Type 2 diabetes mellitus with diabetic neuropathy, unspecified: Secondary | ICD-10-CM | POA: Insufficient documentation

## 2016-08-21 LAB — POCT URINALYSIS DIPSTICK
Bilirubin, UA: NEGATIVE
Blood, UA: NEGATIVE
Glucose, UA: NEGATIVE
Ketones, UA: NEGATIVE
Leukocytes, UA: NEGATIVE
Nitrite, UA: NEGATIVE
Protein, UA: 100
Spec Grav, UA: 1.01 (ref 1.010–1.025)
Urobilinogen, UA: 0.2 E.U./dL
pH, UA: 7.5 (ref 5.0–8.0)

## 2016-08-21 NOTE — Progress Notes (Signed)
Date:  08/21/2016   Name:  Olivia Ramos   DOB:  07-Jul-1955   MRN:  [id number]  PCP:  Adline Potter, MD    Chief Complaint: Urinary Urgency (started a week ago. Feels she has to go and then can not produce urine. ); Dysuria (Feeling like it may be from Gabapentin so stopped the med. ); and Flank Pain (pain in lower right side and feels like bruise when touching. )   History of Present Illness:  This is a 61 y.o. female seen for dysuria and urgency for several weeks, associates with starting gabapentin for presumed neuropathy, sxs improved since stopped two days ago. Also c/o B flank and RLQ pain, only hurts when she pushes. Has increased Lantus to 30 units daily and home BGs improved. BLE pain was improved on gabapentin/Naprosyn, off both now.   Review of Systems:  Review of Systems  Constitutional: Negative for chills and fever.  Respiratory: Negative for shortness of breath.   Cardiovascular: Negative for chest pain and leg swelling.  Genitourinary: Negative for hematuria.  Neurological: Negative for syncope, weakness and light-headedness.    Patient Active Problem List   Diagnosis Date Noted  . Diabetic neuropathy ([place]) 08/21/2016  . Constipation 07/02/2016  . Asthma 07/02/2016  . GERD (gastroesophageal reflux disease) 02/18/2015  . History of colon polyps 02/18/2015  . Osteoarthritis of both feet 02/18/2015  . Hyperlipidemia 11/26/2014  . 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 Essential hypertension 11/23/2014  . Depression 11/23/2014  . Obesity (BMI 30.0-34.9) 11/23/2014    Prior to Admission medications   Medication Sig Start Date End Date Taking? Authorizing Provider  albuterol (PROVENTIL HFA;VENTOLIN HFA) 108 (90 Base) MCG/ACT inhaler Inhale 2 puffs into the lungs every 4 (four) hours as needed for wheezing or shortness of breath. 05/30/16  Yes Melynda Ripple, MD  famotidine-calcium carbonate-magnesium hydroxide (PEPCID COMPLETE) 10-800-165 MG  chewable tablet Chew 1 tablet by mouth daily as needed.   Yes [provider]  Fluticasone-Salmeterol (ADVAIR) 250-50 MCG/DOSE AEPB Inhale 1 puff into the lungs 2 (two) times daily. 05/30/16  Yes Melynda Ripple, MD  glucose blood (FREESTYLE LITE) test strip Use as instructed 07/31/16  Yes Rue Tinnel, Gwyndolyn Saxon, MD  glucose monitoring kit (FREESTYLE) monitoring kit 1 each by Does not apply route daily. Check glucose once in the morning before breakfast and 1 hour after a meal 05/30/16  Yes Melynda Ripple, MD  Insulin Glargine (LANTUS SOLOSTAR) 100 UNIT/ML Solostar Pen Inject 30 Units into the skin daily at 10 pm. 07/06/16  Yes Carlisa Eble, Gwyndolyn Saxon, MD  lisinopril (PRINIVIL,ZESTRIL) 40 MG tablet Take 1 tablet (40 mg total) by mouth daily. 07/06/16  Yes Remas Sobel, Gwyndolyn Saxon, MD  LORazepam (ATIVAN) 0.5 MG tablet Take 1 tablet (0.5 mg total) by mouth every 8 (eight) hours as needed for anxiety (airplane flight). 07/16/16  Yes Montgomery Favor, Gwyndolyn Saxon, MD  metFORMIN (GLUCOPHAGE) 1000 MG tablet Take 1 tablet (1,000 mg total) by mouth 2 (two) times daily with a meal. 07/24/16  Yes Nick Armel, Gwyndolyn Saxon, MD  senna-docusate (SENOKOT S) 8.6-50 MG tablet Take 1 tablet by mouth 2 (two) times daily as needed for mild constipation. 07/02/16  Yes Gracen Southwell, Gwyndolyn Saxon, MD  Spacer/Aero-Holding Chambers (AEROCHAMBER PLUS) inhaler Use as instructed 05/30/16  Yes Melynda Ripple, MD    No Known Allergies  History reviewed. No pertinent surgical history.  Social History  Substance Use Topics  . Smoking status: Never Smoker  . Smokeless tobacco: Never Used  . Alcohol use No    History reviewed.  No pertinent family history.  Medication list has been reviewed and updated.  Physical Examination: BP 124/84   Pulse 90   Resp 16   Ht 5' 1"  (1.549 m)   Wt 178 lb (80.7 kg)   SpO2 97%   BMI 33.63 kg/m   Physical Exam  Constitutional: She appears well-developed and well-nourished.  Cardiovascular: Normal rate, regular rhythm and normal heart  sounds.   Pulmonary/Chest: Effort normal and breath sounds normal.  Abdominal: Soft. She exhibits no distension. There is no tenderness.  Genitourinary:  Genitourinary Comments: Mild R CVA tenderness  Musculoskeletal: She exhibits no edema.  Neurological: She is alert.  Skin: Skin is warm and dry.  Psychiatric: She has a normal mood and affect. Her behavior is normal.  Nursing note and vitals reviewed.   Assessment and Plan:  1. Dysuria UA neg except 2+ protein, sxs improved off gabapentin, recommend stay off, call Monday if sxs unimproved, consider ditropan then - POCT Urinalysis Dipstick  2. Diabetes mellitus with microalbuminuria (HCC) Improved control per pt on increased Lantus, consider asa/statin next visit  3. Essential hypertension Well controlled on increased lisinopril  4. Diabetic polyneuropathy associated with type 2 diabetes mellitus (HCC) Improved on gabapentin, consider Lyrica or Cymbalta if sxs recur  Return if symptoms worsen or fail to improve.  Satira Anis. [facility]  08/21/2016

## 2016-08-24 ENCOUNTER — Other Ambulatory Visit: Payer: Self-pay | Admitting: Family Medicine

## 2016-08-24 ENCOUNTER — Telehealth: Payer: Self-pay

## 2016-08-24 MED ORDER — SULFAMETHOXAZOLE-TRIMETHOPRIM 800-160 MG PO TABS: 1.0000 | ORAL_TABLET | Freq: Two times a day (BID) | ORAL | 0 refills | Status: DC

## 2016-08-24 NOTE — Telephone Encounter (Signed)
Son calling states Dr.Plonk said he may be able to call in Abx if no better today. Patient still in pain and having to run to bathroom but not able to urinate. Uses [facility] if we can call in Abx.

## 2016-08-24 NOTE — Telephone Encounter (Signed)
Bactrim rx sent, call if sxs worsen/persist

## 2016-08-24 NOTE — Telephone Encounter (Signed)
Left message to advise on both phones.

## 2016-09-03 ENCOUNTER — Ambulatory Visit: Payer: BLUE CROSS/BLUE SHIELD | Admitting: Gastroenterology

## 2016-09-03 ENCOUNTER — Encounter: Payer: Self-pay | Admitting: Gastroenterology

## 2016-10-15 ENCOUNTER — Ambulatory Visit: Payer: BLUE CROSS/BLUE SHIELD | Admitting: Family Medicine

## 2016-10-30 ENCOUNTER — Other Ambulatory Visit: Payer: Self-pay | Admitting: Family Medicine

## 2016-10-30 DIAGNOSIS — J4530 Mild persistent asthma, uncomplicated: Secondary | ICD-10-CM

## 2017-01-11 ENCOUNTER — Other Ambulatory Visit: Payer: Self-pay | Admitting: Family Medicine

## 2017-01-11 DIAGNOSIS — J4530 Mild persistent asthma, uncomplicated: Secondary | ICD-10-CM

## 2017-01-11 MED ORDER — FLUTICASONE-SALMETEROL 250-50 MCG/DOSE IN AEPB: 1.0000 | INHALATION_SPRAY | Freq: Two times a day (BID) | RESPIRATORY_TRACT | 5 refills | Status: DC

## 2017-01-21 ENCOUNTER — Encounter: Payer: Self-pay | Admitting: Family Medicine

## 2017-01-21 ENCOUNTER — Ambulatory Visit (INDEPENDENT_AMBULATORY_CARE_PROVIDER_SITE_OTHER): Payer: BLUE CROSS/BLUE SHIELD | Admitting: Family Medicine

## 2017-01-21 ENCOUNTER — Ambulatory Visit
Admission: RE | Admit: 2017-01-21 | Discharge: 2017-01-21 | Disposition: A | Discharge status: Home / Self Care | Payer: BLUE CROSS/BLUE SHIELD | Source: Ambulatory Visit | Attending: Family Medicine | Admitting: Family Medicine

## 2017-01-21 VITALS — BP 124/84 | HR 89 | Resp 16 | Ht 61.0 in | Wt 176.0 lb

## 2017-01-21 DIAGNOSIS — K219 Gastro-esophageal reflux disease without esophagitis: Secondary | ICD-10-CM

## 2017-01-21 DIAGNOSIS — J4540 Moderate persistent asthma, uncomplicated: Secondary | ICD-10-CM

## 2017-01-21 DIAGNOSIS — M79645 Pain in left finger(s): Secondary | ICD-10-CM | POA: Diagnosis not present

## 2017-01-21 DIAGNOSIS — Z0000 Encounter for general adult medical examination without abnormal findings: Secondary | ICD-10-CM

## 2017-01-21 DIAGNOSIS — M19042 Primary osteoarthritis, left hand: Secondary | ICD-10-CM | POA: Insufficient documentation

## 2017-01-21 DIAGNOSIS — R809 Proteinuria, unspecified: Secondary | ICD-10-CM

## 2017-01-21 DIAGNOSIS — E1142 Type 2 diabetes mellitus with diabetic polyneuropathy: Secondary | ICD-10-CM

## 2017-01-21 DIAGNOSIS — K5901 Slow transit constipation: Secondary | ICD-10-CM

## 2017-01-21 DIAGNOSIS — I10 Essential (primary) hypertension: Secondary | ICD-10-CM | POA: Diagnosis not present

## 2017-01-21 DIAGNOSIS — E1129 Type 2 diabetes mellitus with other diabetic kidney complication: Secondary | ICD-10-CM

## 2017-01-21 DIAGNOSIS — E669 Obesity, unspecified: Secondary | ICD-10-CM

## 2017-01-21 DIAGNOSIS — Z23 Encounter for immunization: Secondary | ICD-10-CM | POA: Diagnosis not present

## 2017-01-21 DIAGNOSIS — E785 Hyperlipidemia, unspecified: Secondary | ICD-10-CM | POA: Diagnosis not present

## 2017-01-21 MED ORDER — PREGABALIN 75 MG PO CAPS: 75.0000 mg | ORAL_CAPSULE | Freq: Two times a day (BID) | ORAL | 2 refills | Status: DC

## 2017-01-21 MED ORDER — NAPROXEN 500 MG PO TABS: 500.0000 mg | ORAL_TABLET | Freq: Two times a day (BID) | ORAL | 0 refills | Status: DC

## 2017-01-22 ENCOUNTER — Other Ambulatory Visit: Payer: Self-pay | Admitting: Family Medicine

## 2017-01-22 LAB — MICROALBUMIN / CREATININE URINE RATIO
Creatinine, Urine: 46.4 mg/dL
Microalb/Creat Ratio: 378.9 mg/g creat — ABNORMAL HIGH (ref 0.0–30.0)
Microalbumin, Urine: 175.8 ug/mL

## 2017-01-22 LAB — HIV ANTIBODY (ROUTINE TESTING W REFLEX): HIV Screen 4th Generation wRfx: NONREACTIVE

## 2017-01-22 LAB — LIPID PANEL
Chol/HDL Ratio: 5.2 ratio — ABNORMAL HIGH (ref 0.0–4.4)
Cholesterol, Total: 243 mg/dL — ABNORMAL HIGH (ref 100–199)
HDL: 47 mg/dL (ref >39)
LDL Calculated: 123 mg/dL — ABNORMAL HIGH (ref 0–99)
Triglycerides: 366 mg/dL — ABNORMAL HIGH (ref 0–149)
VLDL Cholesterol Cal: 73 mg/dL — ABNORMAL HIGH (ref 5–40)

## 2017-01-22 LAB — HEMOGLOBIN A1C
Est. average glucose Bld gHb Est-mCnc: 209 mg/dL
Hgb A1c MFr Bld: 8.9 % — ABNORMAL HIGH (ref 4.8–5.6)

## 2017-01-22 LAB — VITAMIN B12: Vitamin B-12: 1410 pg/mL — ABNORMAL HIGH (ref 232–1245)

## 2017-01-22 LAB — HEPATITIS C ANTIBODY: Hep C Virus Ab: <0.1 s/co ratio (ref 0.0–0.9)

## 2017-01-22 MED ORDER — ATORVASTATIN CALCIUM 40 MG PO TABS: 40.0000 mg | ORAL_TABLET | Freq: Every day | ORAL | 3 refills | Status: DC

## 2017-01-22 MED ORDER — ASPIRIN 81 MG PO TABS: 81.0000 mg | ORAL_TABLET | Freq: Every day | ORAL | Status: DC

## 2017-01-22 NOTE — Progress Notes (Signed)
Date:  01/21/2017   Name:  Olivia Ramos   DOB:  May 20, 1955   MRN:  [id number]  PCP:  Adline Potter, MD    Chief Complaint: Annual Exam and Hand Pain (Left hand 3rd digit pain x 1 month)   History of Present Illness:  This is a 61 y.o. female seen for five month f/u. T2DM on Lantus/metformin, home BGs under 250. HTN on increased lisinopril for elevated MCR. C/o BLE burning, intolerant gabapentin, caused dysuria/urgency. GERD ok on prn Pepcid. Asthma stable on Advair/albuterol prn. Constipation ok on prn Senokot. C/o L middle finger DIP pain x 3 months, Tylenol helps some.  Review of Systems:  Review of Systems  Constitutional: Negative for chills and fever.  Respiratory: Negative for cough and shortness of breath.   Cardiovascular: Negative for chest pain.  Gastrointestinal: Negative for abdominal pain.  Endocrine: Negative for polydipsia and polyuria.  Genitourinary: Negative for difficulty urinating and dysuria.  Neurological: Negative for syncope and light-headedness.    Patient Active Problem List   Diagnosis Date Noted  . Diabetic neuropathy ([place]) 08/21/2016  . Constipation 07/02/2016  . Asthma 07/02/2016  . GERD (gastroesophageal reflux disease) 02/18/2015  . History of colon polyps 02/18/2015  . Osteoarthritis of both feet 02/18/2015  . Hyperlipidemia 11/26/2014  . Diabetes mellitus with microalbuminuria ([place]) 11/23/2014  . Essential hypertension 11/23/2014  . Depression 11/23/2014  . Obesity (BMI 30.0-34.9) 11/23/2014    Prior to Admission medications   Medication Sig Start Date End Date Taking? Authorizing Provider  famotidine-calcium carbonate-magnesium hydroxide (PEPCID COMPLETE) 10-800-165 MG chewable tablet Chew 1 tablet by mouth daily as needed.   Yes [provider]  Fluticasone-Salmeterol (ADVAIR) 250-50 MCG/DOSE AEPB Inhale 1 puff into the lungs 2 (two) times daily. 01/11/17  Yes Kylia Grajales, Gwyndolyn Saxon, MD  glucose blood (FREESTYLE LITE) test strip Use  as instructed 07/31/16  Yes Jonnae Fonseca, Gwyndolyn Saxon, MD  glucose monitoring kit (FREESTYLE) monitoring kit 1 each by Does not apply route daily. Check glucose once in the morning before breakfast and 1 hour after a meal 05/30/16  Yes Melynda Ripple, MD  Insulin Glargine (LANTUS SOLOSTAR) 100 UNIT/ML Solostar Pen Inject 30 Units into the skin daily at 10 pm. 07/06/16  Yes Henley Blyth, Gwyndolyn Saxon, MD  lisinopril (PRINIVIL,ZESTRIL) 40 MG tablet Take 1 tablet (40 mg total) by mouth daily. 07/06/16  Yes Rmani Kapusta, Gwyndolyn Saxon, MD  metFORMIN (GLUCOPHAGE) 1000 MG tablet Take 1 tablet (1,000 mg total) by mouth 2 (two) times daily with a meal. 07/24/16  Yes Alnisa Hasley, Gwyndolyn Saxon, MD  senna-docusate (SENOKOT S) 8.6-50 MG tablet Take 1 tablet by mouth 2 (two) times daily as needed for mild constipation. 07/02/16  Yes Harvy Riera, Gwyndolyn Saxon, MD  Spacer/Aero-Holding Chambers (AEROCHAMBER PLUS) inhaler Use as instructed 05/30/16  Yes Melynda Ripple, MD  VENTOLIN HFA 108 (90 Base) MCG/ACT inhaler INHALE 2 PUFFS INTO THE LUNGS EVERY 4 (FOUR) HOURS AS NEEDED FOR WHEEZING OR SHORTNESS OF BREATH. 10/30/16  Yes Daveda Larock, Gwyndolyn Saxon, MD  naproxen (NAPROSYN) 500 MG tablet Take 1 tablet (500 mg total) by mouth 2 (two) times daily with a meal. 01/21/17   Giannina Bartolome, Gwyndolyn Saxon, MD  pregabalin (LYRICA) 75 MG capsule Take 1 capsule (75 mg total) by mouth 2 (two) times daily. 01/21/17   Adline Potter, MD    No Known Allergies  History reviewed. No pertinent surgical history.  Social History  Substance Use Topics  . Smoking status: Never Smoker  . Smokeless tobacco: Never Used  . Alcohol use No    History reviewed. No pertinent  family history.  Medication list has been reviewed and updated.  Physical Examination: BP 124/84   Pulse 89   Resp 16   Ht _0  (1.549 m)   Wt 176 lb (79.8 kg)   SpO2 98%   BMI 33.25 kg/m   Physical Exam  Constitutional: She appears well-developed and well-nourished.  Cardiovascular: Normal rate, regular rhythm and normal heart sounds.    Pulmonary/Chest: Effort normal and breath sounds normal.  Musculoskeletal: She exhibits no edema.  Mod hypertrophy L middle DIP, mildly tender, no erythema  Neurological: She is alert.  Skin: Skin is warm and dry.  Psychiatric: She has a normal mood and affect. Her behavior is normal.  Nursing note and vitals reviewed.   Assessment and Plan:  1. Diabetes mellitus with microalbuminuria ([place]) Marginal control in March, on metformin, increased Lantus - HgB A1c - Urine Microalbumin w/creat. ratio  2. Diabetic polyneuropathy associated with type 2 diabetes mellitus (HCC) Intolerant gabapentin, trial Lyrica - B12  3. Essential hypertension Well controlled on increased lisinopril  4. Moderate persistent asthma, unspecified whether complicated Stable on Advair/albuterol  5. Gastroesophageal reflux disease, esophagitis presence not specified Stable on prn Pepcid  6. Obesity (BMI 30.0-34.9) Weight down 2#, reports unable to exercise  7. Hyperlipidemia, unspecified hyperlipidemia type LDL 113 in March, consider asa/statin - Lipid Profile  8. Slow transit constipation OTC fiber supplements discussed  9. Pain of left middle finger Doubt gout, check XR, consider short course NSAID - DG Hand Complete Left; Future  10. Need for pneumococcal vaccination - Pneumococcal polysaccharide vaccine 23-valent greater than or equal to 2yo subcutaneous/IM  11. Need for influenza vaccination - Flu Vaccine QUAD 6+ mos PF IM (Fluarix Quad PF)  12. Healthcare maintenance - Hepatitis C Antibody - HIV antibody (with reflex)  Return in about 3 months (around 04/23/2017).  Satira Anis. [facility]  01/22/2017

## 2017-02-10 ENCOUNTER — Other Ambulatory Visit: Payer: Self-pay | Admitting: Family Medicine

## 2017-02-10 MED ORDER — GLUCOSE BLOOD VI STRP: ORAL_STRIP | 3 refills | Status: DC

## 2017-02-15 ENCOUNTER — Other Ambulatory Visit: Payer: Self-pay | Admitting: Family Medicine

## 2017-02-15 MED ORDER — PEN NEEDLES 31G X 8 MM MISC: 5 refills | Status: DC

## 2017-03-18 ENCOUNTER — Other Ambulatory Visit: Payer: Self-pay | Admitting: Family Medicine

## 2017-03-18 MED ORDER — LISINOPRIL 40 MG PO TABS: 40.0000 mg | ORAL_TABLET | Freq: Every day | ORAL | 3 refills | Status: DC

## 2017-04-14 ENCOUNTER — Other Ambulatory Visit: Payer: Self-pay | Admitting: Family Medicine

## 2017-04-14 DIAGNOSIS — J4530 Mild persistent asthma, uncomplicated: Secondary | ICD-10-CM

## 2017-04-14 MED ORDER — ALBUTEROL SULFATE HFA 108 (90 BASE) MCG/ACT IN AERS: 2.0000 | INHALATION_SPRAY | RESPIRATORY_TRACT | 5 refills | Status: DC | PRN

## 2017-04-16 ENCOUNTER — Ambulatory Visit: Payer: BLUE CROSS/BLUE SHIELD | Admitting: Family Medicine

## 2017-04-16 ENCOUNTER — Encounter: Payer: Self-pay | Admitting: Family Medicine

## 2017-04-16 VITALS — BP 120/80 | HR 90 | Ht 61.0 in | Wt 176.0 lb

## 2017-04-16 DIAGNOSIS — E1129 Type 2 diabetes mellitus with other diabetic kidney complication: Secondary | ICD-10-CM | POA: Diagnosis not present

## 2017-04-16 DIAGNOSIS — M19042 Primary osteoarthritis, left hand: Secondary | ICD-10-CM

## 2017-04-16 DIAGNOSIS — E785 Hyperlipidemia, unspecified: Secondary | ICD-10-CM | POA: Diagnosis not present

## 2017-04-16 DIAGNOSIS — E1142 Type 2 diabetes mellitus with diabetic polyneuropathy: Secondary | ICD-10-CM | POA: Diagnosis not present

## 2017-04-16 DIAGNOSIS — R809 Proteinuria, unspecified: Secondary | ICD-10-CM

## 2017-04-16 DIAGNOSIS — J4540 Moderate persistent asthma, uncomplicated: Secondary | ICD-10-CM | POA: Diagnosis not present

## 2017-04-16 DIAGNOSIS — Z1211 Encounter for screening for malignant neoplasm of colon: Secondary | ICD-10-CM | POA: Diagnosis not present

## 2017-04-16 DIAGNOSIS — I10 Essential (primary) hypertension: Secondary | ICD-10-CM

## 2017-04-16 DIAGNOSIS — M19049 Primary osteoarthritis, unspecified hand: Secondary | ICD-10-CM | POA: Insufficient documentation

## 2017-04-16 DIAGNOSIS — E669 Obesity, unspecified: Secondary | ICD-10-CM

## 2017-04-16 DIAGNOSIS — Z1231 Encounter for screening mammogram for malignant neoplasm of breast: Secondary | ICD-10-CM | POA: Diagnosis not present

## 2017-04-16 DIAGNOSIS — Z1239 Encounter for other screening for malignant neoplasm of breast: Secondary | ICD-10-CM

## 2017-04-16 MED ORDER — NAPROXEN 500 MG PO TABS: 500.0000 mg | ORAL_TABLET | Freq: Two times a day (BID) | ORAL | 0 refills | Status: DC

## 2017-04-16 MED ORDER — LISINOPRIL 40 MG PO TABS: 40.0000 mg | ORAL_TABLET | Freq: Every day | ORAL | 3 refills | Status: DC

## 2017-04-16 MED ORDER — PEN NEEDLES 31G X 8 MM MISC: 5 refills | Status: DC

## 2017-04-16 MED ORDER — INSULIN GLARGINE 100 UNIT/ML SOLOSTAR PEN: 30.0000 [IU] | PEN_INJECTOR | Freq: Every day | SUBCUTANEOUS | 3 refills | Status: DC

## 2017-04-16 NOTE — Progress Notes (Signed)
Date:  04/16/2017   Name:  Olivia Ramos   DOB:  [birth year]   MRN:  [id number]  PCP:  Adline Potter, MD    Chief Complaint: Hand Pain (Left middle finger hurting for 8 month now. Getting worse. ); Hypertension (Needs refill on Lisinopril.); and Diabetes (Needs refill on Lantus. Needs pen needles to go with it as well. )   History of Present Illness:  This is a 62 y.o. female seen for three month f/u. C/o L hand DIP middle finger pain, XR last visit showed degenerative changes only. T2DM on metformin/Lantus, home BGs ok. DPN improved on Lyrica. Using Advair bid, started on Lipitor for HLD last visit. Needs optho eval, mammo, colonoscopy.  Review of Systems:  Review of Systems  Constitutional: Negative for chills and fever.  Respiratory: Negative for cough and shortness of breath.   Cardiovascular: Negative for chest pain and leg swelling.  Gastrointestinal: Negative for abdominal pain.  Genitourinary: Negative for difficulty urinating.  Neurological: Negative for syncope and light-headedness.    Patient Active Problem List   Diagnosis Date Noted  . 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 Diabetic neuropathy ([place]) 08/21/2016  . Constipation 07/02/2016  . Asthma 07/02/2016  . GERD (gastroesophageal reflux disease) 02/18/2015  . History of colon polyps 02/18/2015  . Osteoarthritis of both feet 02/18/2015  . Hyperlipidemia 11/26/2014  . Diabetes mellitus with microalbuminuria ([place]) 11/23/2014  . Essential hypertension 11/23/2014  . Depression 11/23/2014  . Obesity (BMI 30.0-34.9) 11/23/2014    Prior to Admission medications   Medication Sig Start Date End Date Taking? Authorizing Provider  albuterol (VENTOLIN HFA) 108 (90 Base) MCG/ACT inhaler Inhale 2 puffs into the lungs every 4 (four) hours as needed for wheezing or shortness of breath. 04/14/17  Yes Mechille Varghese, Gwyndolyn Saxon, MD  aspirin 81 MG tablet Take 1 tablet (81 mg total) by mouth daily. 01/22/17  Yes Edem Tiegs, Gwyndolyn Saxon, MD  atorvastatin  (LIPITOR) 40 MG tablet Take 1 tablet (40 mg total) by mouth daily. 01/22/17  Yes Rayvon Brandvold, Gwyndolyn Saxon, MD  famotidine-calcium carbonate-magnesium hydroxide (PEPCID COMPLETE) 10-800-165 MG chewable tablet Chew 1 tablet by mouth daily as needed.   Yes [provider]  Fluticasone-Salmeterol (ADVAIR) 250-50 MCG/DOSE AEPB Inhale 1 puff into the lungs 2 (two) times daily. 01/11/17  Yes Tiegan Jambor, Gwyndolyn Saxon, MD  glucose blood (FREESTYLE LITE) test strip Use as instructed 02/10/17  Yes Tyshaun Vinzant, Gwyndolyn Saxon, MD  glucose monitoring kit (FREESTYLE) monitoring kit 1 each by Does not apply route daily. Check glucose once in the morning before breakfast and 1 hour after a meal 05/30/16  Yes Melynda Ripple, MD  Insulin Glargine (LANTUS SOLOSTAR) 100 UNIT/ML Solostar Pen Inject 30 Units into the skin daily at 10 pm. 04/16/17  Yes Rabab Currington, Gwyndolyn Saxon, MD  Insulin Pen Needle (PEN NEEDLES) 31G X 8 MM MISC Use as directed 04/16/17  Yes Dionisia Pacholski, Gwyndolyn Saxon, MD  lisinopril (PRINIVIL,ZESTRIL) 40 MG tablet Take 1 tablet (40 mg total) by mouth daily. 04/16/17  Yes Aneliz Carbary, Gwyndolyn Saxon, MD  metFORMIN (GLUCOPHAGE) 1000 MG tablet Take 1 tablet (1,000 mg total) by mouth 2 (two) times daily with a meal. 07/24/16  Yes Camile Esters, Gwyndolyn Saxon, MD  pregabalin (LYRICA) 75 MG capsule Take 1 capsule (75 mg total) by mouth 2 (two) times daily. 01/21/17  Yes Jerrol Helmers, Gwyndolyn Saxon, MD  senna-docusate (SENOKOT S) 8.6-50 MG tablet Take 1 tablet by mouth 2 (two) times daily as needed for mild constipation. 07/02/16  Yes Gedalya Jim, Gwyndolyn Saxon, MD  Spacer/Aero-Holding Chambers (AEROCHAMBER PLUS) inhaler Use as instructed 05/30/16  Yes Mortenson,  Caryl Pina, MD  naproxen (NAPROSYN) 500 MG tablet Take 1 tablet (500 mg total) by mouth 2 (two) times daily with a meal. 04/16/17   Persais Ethridge, Gwyndolyn Saxon, MD    No Known Allergies  No past surgical history on file.  Social History   Tobacco Use  . Smoking status: Never Smoker  . Smokeless tobacco: Never Used  Substance Use Topics  . Alcohol use: No     Alcohol/week: 0.0 oz  . Drug use: No    No family history on file.  Medication list has been reviewed and updated.  Physical Examination: BP 120/80   Pulse 90   Ht 5' 1"  (1.549 m)   Wt 176 lb (79.8 kg)   SpO2 98%   BMI 33.25 kg/m   Physical Exam  Constitutional: She appears well-developed and well-nourished.  Cardiovascular: Normal rate, regular rhythm and normal heart sounds.  Pulmonary/Chest: Effort normal.  Exp wheeze at bases  Musculoskeletal: She exhibits no edema.  Edema/tenderness L middle DIP jt  Neurological: She is alert.  Skin: Skin is warm and dry.  Psychiatric: She has a normal mood and affect. Her behavior is normal.  Nursing note and vitals reviewed.   Assessment and Plan:  1. Diabetes mellitus with microalbuminuria (HCC) Cont metformin, refill Lantus/needles - HgB A1c - Urine Microalbumin w/creat. ratio - Ambulatory referral to Ophthalmology  2. Diabetic polyneuropathy associated with type 2 diabetes mellitus (HCC) Improved on Lyrica  3. Primary osteoarthritis of left hand Doubt gout, Naprosyn x 2 weeks only  4. Essential hypertension Well controlled on lisinopril, refill - Comprehensive Metabolic Panel (CMET)  5. Moderate persistent asthma, unspecified whether complicated Cont Advair/albuterol prn  6. Hyperlipidemia, unspecified hyperlipidemia type On Lipitor - Lipid Profile  7. Obesity (BMI 30.0-34.9) Weight stable  8. Breast cancer screening - MM Digital Screening; Future  9. Colon cancer screening - Ambulatory referral to Gastroenterology  Return in about 3 months (around 07/15/2017).  Satira Anis. [facility]  04/16/2017

## 2017-04-17 LAB — COMPREHENSIVE METABOLIC PANEL
ALT: 13 IU/L (ref 0–32)
AST: 16 IU/L (ref 0–40)
Albumin/Globulin Ratio: 1.4 (ref 1.2–2.2)
Albumin: 4.5 g/dL (ref 3.6–4.8)
Alkaline Phosphatase: 77 IU/L (ref 39–117)
BUN/Creatinine Ratio: 28 (ref 12–28)
BUN: 24 mg/dL (ref 8–27)
Bilirubin Total: <0.2 mg/dL (ref 0.0–1.2)
CO2: 21 mmol/L (ref 20–29)
Calcium: 9.8 mg/dL (ref 8.7–10.3)
Chloride: 102 mmol/L (ref 96–106)
Creatinine, Ser: 0.87 mg/dL (ref 0.57–1.00)
GFR calc Af Amer: 83 mL/min/{1.73_m2} (ref >59)
GFR calc non Af Amer: 72 mL/min/{1.73_m2} (ref >59)
Globulin, Total: 3.2 g/dL (ref 1.5–4.5)
Glucose: 123 mg/dL — ABNORMAL HIGH (ref 65–99)
Potassium: 4.5 mmol/L (ref 3.5–5.2)
Sodium: 139 mmol/L (ref 134–144)
Total Protein: 7.7 g/dL (ref 6.0–8.5)

## 2017-04-17 LAB — LIPID PANEL
Chol/HDL Ratio: 4.8 ratio — ABNORMAL HIGH (ref 0.0–4.4)
Cholesterol, Total: 222 mg/dL — ABNORMAL HIGH (ref 100–199)
HDL: 46 mg/dL (ref >39)
LDL Calculated: 117 mg/dL — ABNORMAL HIGH (ref 0–99)
Triglycerides: 296 mg/dL — ABNORMAL HIGH (ref 0–149)
VLDL Cholesterol Cal: 59 mg/dL — ABNORMAL HIGH (ref 5–40)

## 2017-04-17 LAB — MICROALBUMIN / CREATININE URINE RATIO
Creatinine, Urine: 73.7 mg/dL
Microalb/Creat Ratio: 445.6 mg/g creat — ABNORMAL HIGH (ref 0.0–30.0)
Microalbumin, Urine: 328.4 ug/mL

## 2017-04-17 LAB — HEMOGLOBIN A1C
Est. average glucose Bld gHb Est-mCnc: 189 mg/dL
Hgb A1c MFr Bld: 8.2 % — ABNORMAL HIGH (ref 4.8–5.6)

## 2017-04-19 ENCOUNTER — Other Ambulatory Visit: Payer: Self-pay | Admitting: Family Medicine

## 2017-04-19 ENCOUNTER — Ambulatory Visit: Payer: BLUE CROSS/BLUE SHIELD | Admitting: Family Medicine

## 2017-04-19 MED ORDER — IRBESARTAN 300 MG PO TABS: 300.0000 mg | ORAL_TABLET | Freq: Every day | ORAL | 3 refills | Status: DC

## 2017-04-19 MED ORDER — ATORVASTATIN CALCIUM 80 MG PO TABS: 80.0000 mg | ORAL_TABLET | Freq: Every day | ORAL | 3 refills | Status: DC

## 2017-04-27 ENCOUNTER — Ambulatory Visit: Payer: BLUE CROSS/BLUE SHIELD | Admitting: Family Medicine

## 2017-05-17 ENCOUNTER — Ambulatory Visit: Payer: BLUE CROSS/BLUE SHIELD | Admitting: Family Medicine

## 2017-05-17 ENCOUNTER — Encounter: Payer: Self-pay | Admitting: Family Medicine

## 2017-05-17 VITALS — BP 122/84 | HR 84 | Resp 16 | Ht 63.0 in | Wt 177.4 lb

## 2017-05-17 DIAGNOSIS — B351 Tinea unguium: Secondary | ICD-10-CM

## 2017-05-17 DIAGNOSIS — I10 Essential (primary) hypertension: Secondary | ICD-10-CM | POA: Diagnosis not present

## 2017-05-17 DIAGNOSIS — J4540 Moderate persistent asthma, uncomplicated: Secondary | ICD-10-CM | POA: Diagnosis not present

## 2017-05-17 DIAGNOSIS — R809 Proteinuria, unspecified: Secondary | ICD-10-CM

## 2017-05-17 DIAGNOSIS — F329 Major depressive disorder, single episode, unspecified: Secondary | ICD-10-CM

## 2017-05-17 DIAGNOSIS — K219 Gastro-esophageal reflux disease without esophagitis: Secondary | ICD-10-CM

## 2017-05-17 DIAGNOSIS — K5901 Slow transit constipation: Secondary | ICD-10-CM

## 2017-05-17 DIAGNOSIS — E785 Hyperlipidemia, unspecified: Secondary | ICD-10-CM | POA: Diagnosis not present

## 2017-05-17 DIAGNOSIS — E1129 Type 2 diabetes mellitus with other diabetic kidney complication: Secondary | ICD-10-CM

## 2017-05-17 DIAGNOSIS — M19042 Primary osteoarthritis, left hand: Secondary | ICD-10-CM | POA: Diagnosis not present

## 2017-05-17 DIAGNOSIS — E1142 Type 2 diabetes mellitus with diabetic polyneuropathy: Secondary | ICD-10-CM

## 2017-05-17 DIAGNOSIS — E669 Obesity, unspecified: Secondary | ICD-10-CM | POA: Diagnosis not present

## 2017-05-17 DIAGNOSIS — F32A Depression, unspecified: Secondary | ICD-10-CM

## 2017-05-17 MED ORDER — CITALOPRAM HYDROBROMIDE 20 MG PO TABS: 20.0000 mg | ORAL_TABLET | Freq: Every day | ORAL | 2 refills | Status: DC

## 2017-05-17 NOTE — Patient Instructions (Signed)
Try OTC Metamucil or Citrucel for constipation.

## 2017-05-17 NOTE — Progress Notes (Signed)
Date:  05/17/2017   Name:  Olivia Ramos   DOB:  May 14, 1955   MRN:  [id number]  PCP:  Adline Potter, MD    Chief Complaint: Hypertension and Hand Pain (Still having pain in Left hand 3rd digit.)   History of Present Illness:  This is a 62 y.o. female seen for one month f/u. L middle finder pain improved on Naprosyn but recurred though not as bad as before, had cortisone injection R hand in past with good response, XR showed OA only. T2Dm on metformin and Lantus, last a1c 8.2%. Lisinopril changed to Avapro for elevated MCR. Reports hand and foot pain improved on Lyrica. Asthma stable on Advair/albuterol, GERD stable on Pepcid most nights. HLD on increased Lipitor. Still constipated, never started fiber. Off asa. Son states depression worse, crying frequently.  Review of Systems:  Review of Systems  Constitutional: Negative for chills and fever.  Respiratory: Negative for cough and shortness of breath.   Cardiovascular: Negative for chest pain and leg swelling.  Gastrointestinal: Negative for abdominal pain.  Endocrine: Negative for polydipsia and polyuria.  Genitourinary: Negative for difficulty urinating.  Neurological: Negative for syncope and light-headedness.    Patient Active Problem List   Diagnosis Date Noted  . 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 Diabetic neuropathy ([place]) 08/21/2016  . Constipation 07/02/2016  . Asthma 07/02/2016  . GERD (gastroesophageal reflux disease) 02/18/2015  . History of colon polyps 02/18/2015  . Osteoarthritis of both feet 02/18/2015  . Hyperlipidemia 11/26/2014  . 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 Essential hypertension 11/23/2014  . Depression 11/23/2014  . Obesity (BMI 30.0-34.9) 11/23/2014    Prior to Admission medications   Medication Sig Start Date End Date Taking? Authorizing Provider  albuterol (VENTOLIN HFA) 108 (90 Base) MCG/ACT inhaler Inhale 2 puffs into the lungs every 4 (four) hours as needed for  wheezing or shortness of breath. 04/14/17  Yes Alekxander Isola, Gwyndolyn Saxon, MD  atorvastatin (LIPITOR) 80 MG tablet Take 1 tablet (80 mg total) by mouth daily. 04/19/17  Yes Cianna Kasparian, Gwyndolyn Saxon, MD  famotidine-calcium carbonate-magnesium hydroxide (PEPCID COMPLETE) 10-800-165 MG chewable tablet Chew 1 tablet by mouth daily as needed.   Yes [provider]  Fluticasone-Salmeterol (ADVAIR) 250-50 MCG/DOSE AEPB Inhale 1 puff into the lungs 2 (two) times daily. 01/11/17  Yes Rosselyn Martha, Gwyndolyn Saxon, MD  glucose blood (FREESTYLE LITE) test strip Use as instructed 02/10/17  Yes Sheridyn Canino, Gwyndolyn Saxon, MD  glucose monitoring kit (FREESTYLE) monitoring kit 1 each by Does not apply route daily. Check glucose once in the morning before breakfast and 1 hour after a meal 05/30/16  Yes Melynda Ripple, MD  Insulin Glargine (LANTUS SOLOSTAR) 100 UNIT/ML Solostar Pen Inject 30 Units into the skin daily at 10 pm. 04/16/17  Yes Seren Chaloux, Gwyndolyn Saxon, MD  Insulin Pen Needle (PEN NEEDLES) 31G X 8 MM MISC Use as directed 04/16/17  Yes Laresa Oshiro, Gwyndolyn Saxon, MD  irbesartan (AVAPRO) 300 MG tablet Take 1 tablet (300 mg total) by mouth daily. 04/19/17  Yes Delano Frate, Gwyndolyn Saxon, MD  metFORMIN (GLUCOPHAGE) 1000 MG tablet Take 1 tablet (1,000 mg total) by mouth 2 (two) times daily with a meal. 07/24/16  Yes Bradlee Heitman, Gwyndolyn Saxon, MD  pregabalin (LYRICA) 75 MG capsule Take 1 capsule (75 mg total) by mouth 2 (two) times daily. 01/21/17  Yes Albana Saperstein, Gwyndolyn Saxon, MD  senna-docusate (SENOKOT S) 8.6-50 MG tablet Take 1 tablet by mouth 2 (two) times daily as needed for mild constipation. 07/02/16  Yes Keeven Matty, Gwyndolyn Saxon, MD  Spacer/Aero-Holding Chambers (AEROCHAMBER PLUS) inhaler Use as  instructed 05/30/16  Yes Melynda Ripple, MD  citalopram (CELEXA) 20 MG tablet Take 1 tablet (20 mg total) by mouth daily. 05/17/17   Adline Potter, MD    No Known Allergies  History reviewed. No pertinent surgical history.  Social History   Tobacco Use  . Smoking status: Never Smoker  . Smokeless tobacco: Never  Used  Substance Use Topics  . Alcohol use: No    Alcohol/week: 0.0 oz  . Drug use: No    Family History  Problem Relation Age of Onset  . Heart attack Mother   . Diabetes Mother   . Hypertension Father   . Cancer Sister        breast  . Heart attack Brother   . Stroke Sister     Medication list has been reviewed and updated.  Physical Examination: BP 122/84   Pulse 84   Resp 16   Ht _0  (1.6 m)   Wt 177 lb 6.4 oz (80.5 kg)   SpO2 97%   BMI 31.42 kg/m   Physical Exam  Constitutional: She appears well-developed and well-nourished.  Cardiovascular: Normal rate, regular rhythm, normal heart sounds and intact distal pulses.  Pulmonary/Chest: Effort normal and breath sounds normal.  Musculoskeletal: She exhibits no edema.  Neurological: She is alert.  Skin: Skin is warm and dry.  Onychomycosis multiple L toenails  Psychiatric: She has a normal mood and affect. Her behavior is normal.  Nursing note and vitals reviewed.   Assessment and Plan:  1. Diabetes mellitus with microalbuminuria (HCC) Control improving on metformin/Lantus, consider nephro referral if MCR unimproved on Avapro - HgB A1c - Urine Microalbumin w/creat. ratio  2. Diabetic polyneuropathy associated with type 2 diabetes mellitus (HCC) Improved on Lyrica  3. Essential hypertension Well controlled on Avapro  4. Moderate persistent asthma, unspecified whether complicated Well controlled on Advair/albuterol  5. Gastroesophageal reflux disease, esophagitis presence not specified Well controlled on Pepcid prn  6. Primary osteoarthritis of left hand Improved transiently on Naprosyn, responsive to cortisone injection in past - Ambulatory referral to Orthopedic Surgery - Uric acid  7. Depression, unspecified depression type Begin Celexa 20 mg daily - TSH  8. Hyperlipidemia, unspecified hyperlipidemia type On increased Lipitor - Lipid Profile  9. Slow transit constipation Begin OTC fiber  supplements, Senokot prn  10. Onychomycosis Reassurance, consider podiatry referral  11. Obesity (BMI 30.0-34.9) Stable, exercise/weight loss discussed  12. HM Consider optho referral, mammogram, colonoscopy next visit  Return in about 4 weeks (around 06/14/2017).  Satira Anis. [facility]  05/17/2017

## 2017-05-18 LAB — LIPID PANEL
Chol/HDL Ratio: 2.7 ratio (ref 0.0–4.4)
Cholesterol, Total: 126 mg/dL (ref 100–199)
HDL: 47 mg/dL (ref >39)
LDL Calculated: 48 mg/dL (ref 0–99)
Triglycerides: 155 mg/dL — ABNORMAL HIGH (ref 0–149)
VLDL Cholesterol Cal: 31 mg/dL (ref 5–40)

## 2017-05-18 LAB — HEMOGLOBIN A1C
Est. average glucose Bld gHb Est-mCnc: 194 mg/dL
Hgb A1c MFr Bld: 8.4 % — ABNORMAL HIGH (ref 4.8–5.6)

## 2017-05-18 LAB — URIC ACID: Uric Acid: 6.2 mg/dL (ref 2.5–7.1)

## 2017-05-18 LAB — TSH: TSH: 2.63 u[IU]/mL (ref 0.450–4.500)

## 2017-05-18 LAB — MICROALBUMIN / CREATININE URINE RATIO
Creatinine, Urine: 83 mg/dL
Microalb/Creat Ratio: 110.8 mg/g creat — ABNORMAL HIGH (ref 0.0–30.0)
Microalbumin, Urine: 92 ug/mL

## 2017-05-19 ENCOUNTER — Other Ambulatory Visit: Payer: Self-pay | Admitting: Family Medicine

## 2017-05-19 MED ORDER — INSULIN GLARGINE 100 UNIT/ML SOLOSTAR PEN: 35.0000 [IU] | PEN_INJECTOR | Freq: Every day | SUBCUTANEOUS | 3 refills | Status: DC

## 2017-05-20 ENCOUNTER — Other Ambulatory Visit: Payer: Self-pay

## 2017-05-31 DIAGNOSIS — G5602 Carpal tunnel syndrome, left upper limb: Secondary | ICD-10-CM | POA: Insufficient documentation

## 2017-06-05 ENCOUNTER — Ambulatory Visit (INDEPENDENT_AMBULATORY_CARE_PROVIDER_SITE_OTHER): Payer: BLUE CROSS/BLUE SHIELD

## 2017-06-05 ENCOUNTER — Other Ambulatory Visit: Payer: Self-pay

## 2017-06-05 ENCOUNTER — Encounter: Payer: Self-pay | Admitting: *Deleted

## 2017-06-05 ENCOUNTER — Ambulatory Visit
Admission: EM | Admit: 2017-06-05 | Discharge: 2017-06-05 | Disposition: A | Discharge status: Home / Self Care | Payer: BLUE CROSS/BLUE SHIELD | Attending: Family Medicine | Admitting: Family Medicine

## 2017-06-05 DIAGNOSIS — J45901 Unspecified asthma with (acute) exacerbation: Secondary | ICD-10-CM

## 2017-06-05 DIAGNOSIS — J069 Acute upper respiratory infection, unspecified: Secondary | ICD-10-CM

## 2017-06-05 DIAGNOSIS — R0602 Shortness of breath: Secondary | ICD-10-CM

## 2017-06-05 DIAGNOSIS — R05 Cough: Secondary | ICD-10-CM | POA: Diagnosis not present

## 2017-06-05 MED ORDER — IPRATROPIUM-ALBUTEROL 0.5-2.5 (3) MG/3ML IN SOLN
3.0000 mL | Freq: Once | RESPIRATORY_TRACT | Status: AC
  Administered 2017-06-05: 3 mL via RESPIRATORY_TRACT

## 2017-06-05 MED ORDER — ALBUTEROL SULFATE (2.5 MG/3ML) 0.083% IN NEBU: 2.5000 mg | INHALATION_SOLUTION | Freq: Four times a day (QID) | RESPIRATORY_TRACT | 0 refills | Status: DC | PRN

## 2017-06-05 MED ORDER — PREDNISONE 10 MG PO TABS: ORAL_TABLET | ORAL | 0 refills | Status: DC

## 2017-06-05 MED ORDER — BENZONATATE 100 MG PO CAPS: 100.0000 mg | ORAL_CAPSULE | Freq: Three times a day (TID) | ORAL | 0 refills | Status: DC | PRN

## 2017-06-05 MED ORDER — DOXYCYCLINE HYCLATE 100 MG PO CAPS: 100.0000 mg | ORAL_CAPSULE | Freq: Two times a day (BID) | ORAL | 0 refills | Status: DC

## 2017-06-05 NOTE — ED Triage Notes (Signed)
Patient started having symptoms of cough and SOB 1 week ago.

## 2017-06-05 NOTE — ED Provider Notes (Signed)
[facility] ____________________________________________  Time seen: Approximately 10:26 AM  I have reviewed the triage vital signs and the nursing notes.   HISTORY  Chief Complaint Shortness of Breath and Cough   HPI Olivia Ramos is a 62 y.o. female past medical history of asthma, hypertension, diabetes presenting for evaluation of cough and congestion symptoms.  Patient reports she has had cough and congestion for approximately 1 week, however over the last few days she has had wheezing and productive cough.  States cough is productive of greenish phlegm.  States wheezing and coughing occasionally, shortness of breath, denies current shortness of breath.  Denies chest pain or hemoptysis.  Has continue to remain active.  Denies known fevers.  Some sinus congestion as well.  No home sick contacts.  Reports continues to eat and drink well.  Unresolved with over-the-counter decongestant medicine.  Denies other aggravating or alleviating factors.  States did use home albuterol nebulizer solution last approximately 2 hours prior to arrival, and requests solution, Refill as that was her last.states that she does still have plenty of her albuterol inhaler. Denies abdominal pain, dysuria, extremity pain, extremity swelling or rash. Denies recent sickness. Denies recent antibiotic use.   Adline Potter, MD: PCP   Past Medical History:  Diagnosis Date  . Arthritis   . Diabetes mellitus without complication ([place])   . Hypertension     Patient Active Problem List   Diagnosis Date Noted  . Onychomycosis 05/17/2017  . 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 Diabetic neuropathy ([place]) 08/21/2016  . Constipation 07/02/2016  . Asthma 07/02/2016  . GERD (gastroesophageal reflux disease) 02/18/2015  . History of colon polyps 02/18/2015  . Osteoarthritis of both feet 02/18/2015  . Hyperlipidemia 11/26/2014  . 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 Essential  hypertension 11/23/2014  . Depression 11/23/2014  . Obesity (BMI 30.0-34.9) 11/23/2014    History reviewed. No pertinent surgical history.   No current facility-administered medications for this encounter.   Current Outpatient Medications:  .  albuterol (VENTOLIN HFA) 108 (90 Base) MCG/ACT inhaler, Inhale 2 puffs into the lungs every 4 (four) hours as needed for wheezing or shortness of breath., Disp: 1 Inhaler, Rfl: 5 .  atorvastatin (LIPITOR) 80 MG tablet, Take 1 tablet (80 mg total) by mouth daily., Disp: 90 tablet, Rfl: 3 .  citalopram (CELEXA) 20 MG tablet, Take 1 tablet (20 mg total) by mouth daily., Disp: 30 tablet, Rfl: 2 .  famotidine-calcium carbonate-magnesium hydroxide (PEPCID COMPLETE) 10-800-165 MG chewable tablet, Chew 1 tablet by mouth daily as needed., Disp: , Rfl:  .  Fluticasone-Salmeterol (ADVAIR) 250-50 MCG/DOSE AEPB, Inhale 1 puff into the lungs 2 (two) times daily., Disp: 60 each, Rfl: 5 .  glucose blood (FREESTYLE LITE) test strip, Use as instructed, Disp: 100 each, Rfl: 3 .  glucose monitoring kit (FREESTYLE) monitoring kit, 1 each by Does not apply route daily. Check glucose once in the morning before breakfast and 1 hour after a meal, Disp: 1 each, Rfl: 0 .  Insulin Glargine (LANTUS SOLOSTAR) 100 UNIT/ML Solostar Pen, Inject 35 Units into the skin daily at 10 pm., Disp: 30 mL, Rfl: 3 .  Insulin Pen Needle (PEN NEEDLES) 31G X 8 MM MISC, Use as directed, Disp: 100 each, Rfl: 5 .  irbesartan (AVAPRO) 300 MG tablet, Take 1 tablet (300 mg total) by mouth daily., Disp: 90 tablet, Rfl: 3 .  metFORMIN (GLUCOPHAGE) 1000 MG tablet, Take 1 tablet (1,000 mg total) by mouth 2 (two) times daily with  a meal., Disp: 180 tablet, Rfl: 3 .  senna-docusate (SENOKOT S) 8.6-50 MG tablet, Take 1 tablet by mouth 2 (two) times daily as needed for mild constipation., Disp: , Rfl:  .  Spacer/Aero-Holding Chambers (AEROCHAMBER PLUS) inhaler, Use as instructed, Disp: 1 each, Rfl: 2 .  albuterol  (PROVENTIL) (2.5 MG/3ML) 0.083% nebulizer solution, Take 3 mLs (2.5 mg total) by nebulization every 6 (six) hours as needed for wheezing or shortness of breath., Disp: 75 mL, Rfl: 0 .  benzonatate (TESSALON PERLES) 100 MG capsule, Take 1 capsule (100 mg total) by mouth 3 (three) times daily as needed for cough., Disp: 15 capsule, Rfl: 0 .  doxycycline (VIBRAMYCIN) 100 MG capsule, Take 1 capsule (100 mg total) by mouth 2 (two) times daily., Disp: 20 capsule, Rfl: 0 .  predniSONE (DELTASONE) 10 MG tablet, Start 60 mg po day one, then 50 mg po day two, taper by 10 mg daily until complete., Disp: 21 tablet, Rfl: 0  Allergies Patient has no known allergies.  Family History  Problem Relation Age of Onset  . Heart attack Mother   . Diabetes Mother   . Hypertension Father   . Cancer Sister        breast  . Heart attack Brother   . Stroke Sister     Social History Social History   Tobacco Use  . Smoking status: Never Smoker  . Smokeless tobacco: Never Used  Substance Use Topics  . Alcohol use: No    Alcohol/week: 0.0 oz  . Drug use: No    Review of Systems Constitutional: No fever/chills ENT: No sore throat. Cardiovascular: Denies chest pain. Respiratory: Denies shortness of breath. Gastrointestinal: No abdominal pain.  Genitourinary: Negative for dysuria. Skin: Negative for rash.  ____________________________________________   PHYSICAL EXAM:  VITAL SIGNS: ED Triage Vitals  Enc Vitals Group     BP 06/05/17 0915 (!) 148/73     Pulse Rate 06/05/17 0915 93     Resp 06/05/17 0915 16     Temp 06/05/17 0915 98.1 F (36.7 C)     Temp Source 06/05/17 0915 Oral     SpO2 06/05/17 0915 96 %     Weight --      Height --      Head Circumference --      Peak Flow --      Pain Score 06/05/17 0917 5     Pain Loc --      Pain Edu? --      Excl. in [place]? --     Constitutional: Alert and oriented. Well appearing and in no acute distress. Eyes: Conjunctivae are normal.  Head:  Atraumatic. No sinus tenderness to palpation. No swelling. No erythema.  Ears: no erythema, normal TMs bilaterally.   Nose:Nasal congestion  Mouth/Throat: Mucous membranes are moist. No pharyngeal erythema. No tonsillar swelling or exudate.  Neck: No stridor.  No cervical spine tenderness to palpation. Hematological/Lymphatic/Immunilogical: No cervical lymphadenopathy. Cardiovascular: Normal rate, regular rhythm. Grossly normal heart sounds.  Good peripheral circulation. Respiratory: Normal respiratory effort.  No retractions.  Mild scattered inspiratory and expiratory wheezes.  Coarse scattered rhonchi.  Speaks in complete sentences.  Dry intermittent cough.  Good air movement.  Gastrointestinal: Soft and nontender.  Musculoskeletal: Ambulatory with steady gait. Neurologic:  Normal speech and language. No gait instability. Skin:  Skin appears warm, dry and intact. No rash noted. Psychiatric: Mood and affect are normal. Speech and behavior are normal.  ___________________________________________   LABS (all labs ordered  are listed, but only abnormal results are displayed)  Labs Reviewed - No data to display ____________________________________________  RADIOLOGY  Dg Chest 2 View  Result Date: 06/05/2017 CLINICAL DATA:  Cough, congestion, wheezing EXAM: CHEST  2 VIEW COMPARISON:  05/30/2016 FINDINGS: Lungs are clear.  No pleural effusion or pneumothorax. The heart is normal in size. Degenerative changes of the visualized thoracolumbar spine. IMPRESSION: Normal chest radiographs. Electronically Signed   By: Julian Hy M.D.   On: 06/05/2017 10:20   ____________________________________________   PROCEDURES Procedures    INITIAL IMPRESSION / ASSESSMENT AND PLAN / ED COURSE  Pertinent labs & imaging results that were available during my care of the patient were reviewed by me and considered in my medical decision making (see chart for details).  Well-appearing patient.  No  acute distress.  Daughter at bedside.  DuoNeb neb given in urgent care with improved wheezing post treatment.  Chest x-ray negative per radiologist reviewed by myself.  Suspect recent viral illness with asthma exacerbation.  Will treat with prednisone, refill albuterol solution, doxycycline and PRN Tessalon Perles.  Discussed monitoring blood sugar closely with prednisone.  Encourage rest, fluids, supportive care.Discussed indication, risks and benefits of medications with patient.  Discussed follow up with Primary care physician this week. Discussed follow up and return parameters including no resolution or any worsening concerns. Patient verbalized understanding and agreed to plan.   ____________________________________________   FINAL CLINICAL IMPRESSION(S) / ED DIAGNOSES  Final diagnoses:  Upper respiratory tract infection, unspecified type  Exacerbation of asthma, unspecified asthma severity, unspecified whether persistent     ED Discharge Orders        Ordered    predniSONE (DELTASONE) 10 MG tablet     06/05/17 1030    albuterol (PROVENTIL) (2.5 MG/3ML) 0.083% nebulizer solution  Every 6 hours PRN     06/05/17 1030    doxycycline (VIBRAMYCIN) 100 MG capsule  2 times daily     06/05/17 1030    benzonatate (TESSALON PERLES) 100 MG capsule  3 times daily PRN     06/05/17 1030       Note: This dictation was prepared with Dragon dictation along with smaller phrase technology. Any transcriptional errors that result from this process are unintentional.         Marylene Land, NP 06/05/17 1400

## 2017-06-05 NOTE — Discharge Instructions (Signed)
Take medication as prescribed. Rest. Drink plenty of fluids.  ° °Follow up with your primary care physician this week as needed. Return to Urgent care for new or worsening concerns.  ° °

## 2017-06-21 ENCOUNTER — Ambulatory Visit: Payer: BLUE CROSS/BLUE SHIELD | Admitting: Family Medicine

## 2017-07-01 ENCOUNTER — Encounter: Payer: Self-pay | Admitting: Family Medicine

## 2017-07-01 DIAGNOSIS — E113299 Type 2 diabetes mellitus with mild nonproliferative diabetic retinopathy without macular edema, unspecified eye: Secondary | ICD-10-CM | POA: Insufficient documentation

## 2017-07-01 LAB — HM DIABETES EYE EXAM

## 2017-07-12 ENCOUNTER — Ambulatory Visit: Payer: BLUE CROSS/BLUE SHIELD | Admitting: Family Medicine

## 2017-07-12 ENCOUNTER — Encounter: Payer: Self-pay | Admitting: Family Medicine

## 2017-07-12 VITALS — BP 132/82 | HR 87 | Resp 16 | Ht 63.0 in | Wt 177.4 lb

## 2017-07-12 DIAGNOSIS — K219 Gastro-esophageal reflux disease without esophagitis: Secondary | ICD-10-CM | POA: Diagnosis not present

## 2017-07-12 DIAGNOSIS — I10 Essential (primary) hypertension: Secondary | ICD-10-CM

## 2017-07-12 DIAGNOSIS — F329 Major depressive disorder, single episode, unspecified: Secondary | ICD-10-CM | POA: Diagnosis not present

## 2017-07-12 DIAGNOSIS — K5901 Slow transit constipation: Secondary | ICD-10-CM

## 2017-07-12 DIAGNOSIS — L600 Ingrowing nail: Secondary | ICD-10-CM

## 2017-07-12 DIAGNOSIS — J4540 Moderate persistent asthma, uncomplicated: Secondary | ICD-10-CM | POA: Diagnosis not present

## 2017-07-12 DIAGNOSIS — M19042 Primary osteoarthritis, left hand: Secondary | ICD-10-CM

## 2017-07-12 DIAGNOSIS — E1129 Type 2 diabetes mellitus with other diabetic kidney complication: Secondary | ICD-10-CM | POA: Diagnosis not present

## 2017-07-12 DIAGNOSIS — R809 Proteinuria, unspecified: Secondary | ICD-10-CM | POA: Diagnosis not present

## 2017-07-12 DIAGNOSIS — E785 Hyperlipidemia, unspecified: Secondary | ICD-10-CM

## 2017-07-12 DIAGNOSIS — F32A Depression, unspecified: Secondary | ICD-10-CM

## 2017-07-12 DIAGNOSIS — G5602 Carpal tunnel syndrome, left upper limb: Secondary | ICD-10-CM | POA: Diagnosis not present

## 2017-07-12 DIAGNOSIS — E1142 Type 2 diabetes mellitus with diabetic polyneuropathy: Secondary | ICD-10-CM

## 2017-07-12 MED ORDER — SULFAMETHOXAZOLE-TRIMETHOPRIM 800-160 MG PO TABS: 1.0000 | ORAL_TABLET | Freq: Two times a day (BID) | ORAL | 0 refills | Status: DC

## 2017-07-12 NOTE — Progress Notes (Signed)
Date:  07/12/2017   Name:  Olivia Ramos   DOB:  1956-03-21   MRN:  [id number]  PCP:  Adline Potter, MD    Chief Complaint: Foot Problem (pain and redness on left foot great toe. Hurts to touch started abnout 1.5 weeks ago. )   History of Present Illness:  This is a 62 y.o. female seen same day for 1.5 week hx pain/swelling L great toe. T2DM on metformin/Lantus, never increased Lantus dose after last visit. Saw ortho for L hand pain, felt CTS, placed on Mobic which helps and scheduled for NCS later this month. Mood improved on Celexa. Neuropathy adequately controlled on Lyrica, on Avapro for HTN, microalbuminuria improved last visit. Asthma stable on Advair/albuterol, constipation ok on fiber supp, prn Senekot. Saw ophtho last month, has orders for mammogram and colonoscopy from Jan but not yet scheduled.  Review of Systems:  Review of Systems  Constitutional: Negative for chills and fever.  Respiratory: Negative for cough and shortness of breath.   Cardiovascular: Negative for chest pain and leg swelling.  Genitourinary: Negative for difficulty urinating.  Neurological: Negative for syncope and light-headedness.    Patient Active Problem List   Diagnosis Date Noted  . Background diabetic retinopathy ([place]) 07/01/2017  . Carpal tunnel syndrome of left wrist 05/31/2017  . Onychomycosis 05/17/2017  . 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 Diabetic neuropathy ([place]) 08/21/2016  . Constipation 07/02/2016  . Asthma 07/02/2016  . GERD (gastroesophageal reflux disease) 02/18/2015  . History of colon polyps 02/18/2015  . Osteoarthritis of both feet 02/18/2015  . Hyperlipidemia 11/26/2014  . Diabetes mellitus with microalbuminuria ([place]) 11/23/2014  . Essential hypertension 11/23/2014  . Depression 11/23/2014  . Obesity (BMI 30.0-34.9) 11/23/2014    Prior to Admission medications   Medication Sig Start Date End Date Taking? Authorizing Provider  albuterol (PROVENTIL) (2.5 MG/3ML) 0.083%  nebulizer solution Take 3 mLs (2.5 mg total) by nebulization every 6 (six) hours as needed for wheezing or shortness of breath. 06/05/17  Yes Marylene Land, NP  albuterol (VENTOLIN HFA) 108 (90 Base) MCG/ACT inhaler Inhale 2 puffs into the lungs every 4 (four) hours as needed for wheezing or shortness of breath. 04/14/17  Yes Kadin Canipe, Gwyndolyn Saxon, MD  atorvastatin (LIPITOR) 80 MG tablet Take 1 tablet (80 mg total) by mouth daily. 04/19/17  Yes Tammera Engert, Gwyndolyn Saxon, MD  citalopram (CELEXA) 20 MG tablet Take 1 tablet (20 mg total) by mouth daily. 05/17/17  Yes Forest Pruden, Gwyndolyn Saxon, MD  famotidine-calcium carbonate-magnesium hydroxide (PEPCID COMPLETE) 10-800-165 MG chewable tablet Chew 1 tablet by mouth daily as needed.   Yes [provider]  Fluticasone-Salmeterol (ADVAIR) 250-50 MCG/DOSE AEPB Inhale 1 puff into the lungs 2 (two) times daily. 01/11/17  Yes Kaisei Gilbo, Gwyndolyn Saxon, MD  glucose blood (FREESTYLE LITE) test strip Use as instructed 02/10/17  Yes Ryelee Albee, Gwyndolyn Saxon, MD  glucose monitoring kit (FREESTYLE) monitoring kit 1 each by Does not apply route daily. Check glucose once in the morning before breakfast and 1 hour after a meal 05/30/16  Yes Melynda Ripple, MD  Insulin Glargine (LANTUS SOLOSTAR) 100 UNIT/ML Solostar Pen Inject 35 Units into the skin daily at 10 pm. 05/19/17  Yes Aeriana Speece, Gwyndolyn Saxon, MD  Insulin Pen Needle (PEN NEEDLES) 31G X 8 MM MISC Use as directed 04/16/17  Yes Kace Hartje, Gwyndolyn Saxon, MD  irbesartan (AVAPRO) 300 MG tablet Take 1 tablet (300 mg total) by mouth daily. 04/19/17  Yes Elener Custodio, Gwyndolyn Saxon, MD  metFORMIN (GLUCOPHAGE) 1000 MG tablet Take 1 tablet (1,000 mg total) by mouth 2 (two)  times daily with a meal. 07/24/16  Yes Jaysun Wessels, Gwyndolyn Saxon, MD  senna-docusate (SENOKOT S) 8.6-50 MG tablet Take 1 tablet by mouth 2 (two) times daily as needed for mild constipation. 07/02/16  Yes Cereniti Curb, Gwyndolyn Saxon, MD  Spacer/Aero-Holding Chambers (AEROCHAMBER PLUS) inhaler Use as instructed 05/30/16  Yes Melynda Ripple, MD   sulfamethoxazole-trimethoprim (BACTRIM DS) 800-160 MG tablet Take 1 tablet by mouth 2 (two) times daily. 07/12/17   Adline Potter, MD    No Known Allergies  History reviewed. No pertinent surgical history.  Social History   Tobacco Use  . Smoking status: Never Smoker  . Smokeless tobacco: Never Used  Substance Use Topics  . Alcohol use: No    Alcohol/week: 0.0 oz  . Drug use: No    Family History  Problem Relation Age of Onset  . Heart attack Mother   . Diabetes Mother   . Hypertension Father   . Cancer Sister        breast  . Heart attack Brother   . Stroke Sister     Medication list has been reviewed and updated.  Physical Examination: BP 132/82   Pulse 87   Resp 16   Ht 5' 3"  (1.6 m)   Wt 177 lb 6.4 oz (80.5 kg)   SpO2 97%   BMI 31.42 kg/m   Physical Exam  Constitutional: She appears well-developed and well-nourished.  Cardiovascular: Normal rate, regular rhythm and normal heart sounds.  Pulmonary/Chest: Effort normal and breath sounds normal.  Musculoskeletal: She exhibits no edema.  Neurological: She is alert.  Skin: Skin is warm and dry.  Erythema, edema, tenderness around ingrown L medial great tonail  Psychiatric: Her behavior is normal.  Affect improved  Nursing note and vitals reviewed.   Assessment and Plan:  1. Diabetes mellitus with microalbuminuria (HCC) Marginal control, continue metformin, increase Lantus to 35 units daily, recheck a1c/MCR next visit  2. Diabetic polyneuropathy associated with type 2 diabetes mellitus ([place]) Well controlled on Lyrica  3. Ingrown toenail of left foot with infection Bactrim DS bid x 7d, call if sxs worsen/persist - Ambulatory referral to Podiatry  4. Depression, unspecified depression type Improved on Celexa  5. Carpal tunnel syndrome of left wrist For NCS 4/30 with ortho f/u  6. Primary osteoarthritis of left hand Improved on Mobic per ortho, discussed risks of long-term use  7. Essential  hypertension Adequate control on Avapro  8. Hyperlipidemia, unspecified hyperlipidemia type Well controlled on Lipitor  9. Moderate persistent asthma, unspecified whether complicated Well controlled on Advair, prn albuterol  10. Gastroesophageal reflux disease, esophagitis presence not specified Well controlled on prn Pepcid  11. Slow transit constipation Well controlled on fiber supp, prn Senokot  12. HM Encouraged scheduling mammogram and colonoscopy ordered in Jan  Return in about 3 months (around 10/11/2017).  Satira Anis. [facility]  07/12/2017

## 2017-07-15 ENCOUNTER — Ambulatory Visit: Payer: BLUE CROSS/BLUE SHIELD | Admitting: Family Medicine

## 2017-07-28 ENCOUNTER — Ambulatory Visit
Admission: RE | Admit: 2017-07-28 | Discharge: 2017-07-28 | Disposition: A | Discharge status: Home / Self Care | Payer: BLUE CROSS/BLUE SHIELD | Source: Ambulatory Visit | Attending: Family Medicine | Admitting: Family Medicine

## 2017-07-28 DIAGNOSIS — Z1231 Encounter for screening mammogram for malignant neoplasm of breast: Secondary | ICD-10-CM | POA: Insufficient documentation

## 2017-07-28 DIAGNOSIS — Z1239 Encounter for other screening for malignant neoplasm of breast: Secondary | ICD-10-CM

## 2017-08-03 ENCOUNTER — Ambulatory Visit: Payer: BLUE CROSS/BLUE SHIELD | Admitting: Family Medicine

## 2017-08-03 ENCOUNTER — Encounter: Payer: Self-pay | Admitting: Family Medicine

## 2017-08-03 VITALS — BP 130/80 | HR 68 | Ht 63.0 in | Wt 176.0 lb

## 2017-08-03 DIAGNOSIS — R809 Proteinuria, unspecified: Secondary | ICD-10-CM

## 2017-08-03 DIAGNOSIS — E1129 Type 2 diabetes mellitus with other diabetic kidney complication: Secondary | ICD-10-CM

## 2017-08-03 DIAGNOSIS — E782 Mixed hyperlipidemia: Secondary | ICD-10-CM

## 2017-08-03 DIAGNOSIS — I10 Essential (primary) hypertension: Secondary | ICD-10-CM | POA: Diagnosis not present

## 2017-08-03 DIAGNOSIS — L600 Ingrowing nail: Secondary | ICD-10-CM | POA: Diagnosis not present

## 2017-08-03 NOTE — Progress Notes (Signed)
Name: Olivia Ramos   MRN: [id number]    DOB: 06-26-1955   Date:08/03/2017       Progress Note  Subjective  Chief Complaint  Chief Complaint  Patient presents with  . Toe Pain    Dr Vickki Muff removed both sides of nail on Great toe. Was put on Sulfa. Went to [place] and a dr looked at it- changed antibiotic to Keflex. Stopped taking med on 22    Toe Pain   The incident occurred more than 1 week ago. Injury mechanism: patient sustain injury while trimming of nail. Pain location: left great toe. The quality of the pain is described as aching. The pain is mild. The pain has been fluctuating since onset. Pertinent negatives include no inability to bear weight, loss of motion, loss of sensation, muscle weakness, numbness or tingling. The symptoms are aggravated by movement. Improvement on treatment: partial treat withcephalxin/took 5 days.  Diabetes  She presents for her follow-up diabetic visit. Pertinent negatives for hypoglycemia include no dizziness, headaches or nervousness/anxiousness. Pertinent negatives for diabetes include no blurred vision, no chest pain, no polydipsia, no polyphagia, no polyuria and no weight loss. There are no hypoglycemic complications. There are no diabetic complications.    No problem-specific Assessment & Plan notes found for this encounter.   Past Medical History:  Diagnosis Date  . Arthritis   . Diabetes mellitus without complication ([place])   . Hypertension     History reviewed. No pertinent surgical history.  Family History  Problem Relation Age of Onset  . Heart attack Mother   . Diabetes Mother   . Hypertension Father   . Cancer Sister        breast  . Heart attack Brother   . Stroke Sister   . Breast cancer Neg Hx     Social History   Socioeconomic History  . Marital status: Married    Spouse name: Not on file  . Number of children: Not on file  . Years of education: Not on file  . Highest education level: Not on file  Occupational History   . Not on file  Social Needs  . Financial resource strain: Not on file  . Food insecurity:    Worry: Not on file    Inability: Not on file  . Transportation needs:    Medical: Patient refused    Non-medical: Patient refused  Tobacco Use  . Smoking status: Never Smoker  . Smokeless tobacco: Never Used  Substance and Sexual Activity  . Alcohol use: No    Alcohol/week: 0.0 oz  . Drug use: No  . Sexual activity: Not on file  Lifestyle  . Physical activity:    Days per week: Not on file    Minutes per session: Not on file  . Stress: Not on file  Relationships  . Social connections:    Talks on phone: Not on file    Gets together: Not on file    Attends religious service: Not on file    Active member of club or organization: Not on file    Attends meetings of clubs or organizations: Not on file    Relationship status: Not on file  . Intimate partner violence:    Fear of current or ex partner: Not on file    Emotionally abused: Not on file    Physically abused: Not on file    Forced sexual activity: Not on file  Other Topics Concern  . Not on file  Social History Narrative  .  Not on file    No Known Allergies  Outpatient Medications Prior to Visit  Medication Sig Dispense Refill  . albuterol (PROVENTIL) (2.5 MG/3ML) 0.083% nebulizer solution Take 3 mLs (2.5 mg total) by nebulization every 6 (six) hours as needed for wheezing or shortness of breath. 75 mL 0  . albuterol (VENTOLIN HFA) 108 (90 Base) MCG/ACT inhaler Inhale 2 puffs into the lungs every 4 (four) hours as needed for wheezing or shortness of breath. 1 Inhaler 5  . atorvastatin (LIPITOR) 80 MG tablet Take 1 tablet (80 mg total) by mouth daily. 90 tablet 3  . cephALEXin (KEFLEX) 500 MG capsule     . citalopram (CELEXA) 20 MG tablet Take 1 tablet (20 mg total) by mouth daily. 30 tablet 2  . famotidine-calcium carbonate-magnesium hydroxide (PEPCID COMPLETE) 10-800-165 MG chewable tablet Chew 1 tablet by mouth daily  as needed.    . Fluticasone-Salmeterol (ADVAIR) 250-50 MCG/DOSE AEPB Inhale 1 puff into the lungs 2 (two) times daily. 60 each 5  . glucose blood (FREESTYLE LITE) test strip Use as instructed 100 each 3  . glucose monitoring kit (FREESTYLE) monitoring kit 1 each by Does not apply route daily. Check glucose once in the morning before breakfast and 1 hour after a meal 1 each 0  . Insulin Glargine (LANTUS SOLOSTAR) 100 UNIT/ML Solostar Pen Inject 35 Units into the skin daily at 10 pm. 30 mL 3  . Insulin Pen Needle (PEN NEEDLES) 31G X 8 MM MISC Use as directed 100 each 5  . irbesartan (AVAPRO) 300 MG tablet Take 1 tablet (300 mg total) by mouth daily. 90 tablet 3  . meloxicam (MOBIC) 15 MG tablet Take 15 mg by mouth daily.    . metFORMIN (GLUCOPHAGE) 1000 MG tablet Take 1 tablet (1,000 mg total) by mouth 2 (two) times daily with a meal. 180 tablet 3  . senna-docusate (SENOKOT S) 8.6-50 MG tablet Take 1 tablet by mouth 2 (two) times daily as needed for mild constipation.    Marland Kitchen Spacer/Aero-Holding Chambers (AEROCHAMBER PLUS) inhaler Use as instructed 1 each 2  . sulfamethoxazole-trimethoprim (BACTRIM DS) 800-160 MG tablet Take 1 tablet by mouth 2 (two) times daily. 14 tablet 0   No facility-administered medications prior to visit.     Review of Systems  Constitutional: Negative for chills, fever, malaise/fatigue and weight loss.  HENT: Negative for ear discharge, ear pain and sore throat.   Eyes: Negative for blurred vision.  Respiratory: Negative for cough, sputum production, shortness of breath and wheezing.   Cardiovascular: Negative for chest pain, palpitations and leg swelling.  Gastrointestinal: Negative for abdominal pain, blood in stool, constipation, diarrhea, heartburn, melena and nausea.  Genitourinary: Negative for dysuria, frequency, hematuria and urgency.  Musculoskeletal: Negative for back pain, joint pain, myalgias and neck pain.  Skin: Negative for rash.  Neurological: Negative  for dizziness, tingling, sensory change, focal weakness, numbness and headaches.  Endo/Heme/Allergies: Negative for environmental allergies, polydipsia and polyphagia. Does not bruise/bleed easily.  Psychiatric/Behavioral: Negative for depression and suicidal ideas. The patient is not nervous/anxious and does not have insomnia.      Objective  Vitals:   08/03/17 1554  BP: 130/80  Pulse: 68  Weight: 176 lb (79.8 kg)  Height: 5' 3"  (1.6 m)    Physical Exam  Constitutional: No distress.  HENT:  Head: Normocephalic and atraumatic.  Right Ear: External ear normal.  Left Ear: External ear normal.  Nose: Nose normal.  Mouth/Throat: Oropharynx is clear and moist.  Eyes:  Pupils are equal, round, and reactive to light. Conjunctivae and EOM are normal. Right eye exhibits no discharge. Left eye exhibits no discharge.  Neck: Normal range of motion. Neck supple. No JVD present. No thyromegaly present.  Cardiovascular: Normal rate, regular rhythm, normal heart sounds and intact distal pulses. Exam reveals no gallop and no friction rub.  No murmur heard. Pulmonary/Chest: Effort normal and breath sounds normal.  Abdominal: Soft. Bowel sounds are normal. She exhibits no mass. There is no tenderness. There is no guarding.  Musculoskeletal: Normal range of motion. She exhibits no edema.  Left great toe /no erythema/ mild pressure/ no purulence evident/ residual ecchymosis ? trauma  Lymphadenopathy:    She has no cervical adenopathy.  Neurological: She is alert. She has normal reflexes.  Skin: Skin is warm and dry. She is not diaphoretic.  Nursing note and vitals reviewed.     Assessment & Plan  Problem List Items Addressed This Visit      Cardiovascular and Mediastinum   Essential hypertension     Endocrine   Diabetes mellitus with microalbuminuria ([place])     Other   Hyperlipidemia    Other Visit Diagnoses    Ingrowing nail, left great toe    -  Primary   perhaps residual infection    Relevant Orders   Ambulatory referral to Podiatry      No orders of the defined types were placed in this encounter.     Dr. [facility]  08/03/17

## 2017-08-04 ENCOUNTER — Other Ambulatory Visit: Payer: Self-pay

## 2017-08-16 ENCOUNTER — Other Ambulatory Visit: Payer: Self-pay

## 2017-08-16 MED ORDER — METFORMIN HCL 1000 MG PO TABS: 1000.0000 mg | ORAL_TABLET | Freq: Two times a day (BID) | ORAL | 0 refills | Status: DC

## 2017-08-26 ENCOUNTER — Ambulatory Visit: Payer: BLUE CROSS/BLUE SHIELD | Admitting: Family Medicine

## 2017-09-11 ENCOUNTER — Other Ambulatory Visit: Payer: Self-pay | Admitting: Family Medicine

## 2017-10-08 ENCOUNTER — Other Ambulatory Visit: Payer: Self-pay | Admitting: Family Medicine

## 2017-10-17 ENCOUNTER — Other Ambulatory Visit: Payer: Self-pay | Admitting: Family Medicine

## 2017-10-29 ENCOUNTER — Ambulatory Visit: Payer: BLUE CROSS/BLUE SHIELD | Admitting: Family Medicine

## 2017-10-29 ENCOUNTER — Encounter: Payer: Self-pay | Admitting: Family Medicine

## 2017-10-29 VITALS — BP 123/83 | HR 73 | Resp 16 | Ht 63.0 in | Wt 173.0 lb

## 2017-10-29 DIAGNOSIS — F329 Major depressive disorder, single episode, unspecified: Secondary | ICD-10-CM

## 2017-10-29 DIAGNOSIS — J4530 Mild persistent asthma, uncomplicated: Secondary | ICD-10-CM | POA: Diagnosis not present

## 2017-10-29 DIAGNOSIS — I10 Essential (primary) hypertension: Secondary | ICD-10-CM | POA: Diagnosis not present

## 2017-10-29 DIAGNOSIS — E119 Type 2 diabetes mellitus without complications: Secondary | ICD-10-CM

## 2017-10-29 DIAGNOSIS — E782 Mixed hyperlipidemia: Secondary | ICD-10-CM

## 2017-10-29 DIAGNOSIS — Z794 Long term (current) use of insulin: Secondary | ICD-10-CM

## 2017-10-29 DIAGNOSIS — R809 Proteinuria, unspecified: Secondary | ICD-10-CM

## 2017-10-29 DIAGNOSIS — F32A Depression, unspecified: Secondary | ICD-10-CM

## 2017-10-29 MED ORDER — CITALOPRAM HYDROBROMIDE 20 MG PO TABS: 20.0000 mg | ORAL_TABLET | Freq: Every day | ORAL | 2 refills | Status: DC

## 2017-10-29 MED ORDER — FLUTICASONE-SALMETEROL 250-50 MCG/DOSE IN AEPB: 1.0000 | INHALATION_SPRAY | Freq: Two times a day (BID) | RESPIRATORY_TRACT | 5 refills | Status: DC

## 2017-10-29 MED ORDER — INSULIN GLARGINE 100 UNIT/ML SOLOSTAR PEN: 35.0000 [IU] | PEN_INJECTOR | Freq: Every day | SUBCUTANEOUS | 3 refills | Status: DC

## 2017-10-29 MED ORDER — PEN NEEDLES 31G X 8 MM MISC: 5 refills | Status: DC

## 2017-10-29 MED ORDER — ALBUTEROL SULFATE HFA 108 (90 BASE) MCG/ACT IN AERS: 2.0000 | INHALATION_SPRAY | RESPIRATORY_TRACT | 5 refills | Status: DC | PRN

## 2017-10-29 MED ORDER — LOSARTAN POTASSIUM 50 MG PO TABS: 50.0000 mg | ORAL_TABLET | Freq: Every day | ORAL | 1 refills | Status: DC

## 2017-10-29 MED ORDER — FREESTYLE LANCETS MISC: 12 refills | Status: DC

## 2017-10-29 MED ORDER — ATORVASTATIN CALCIUM 80 MG PO TABS: 80.0000 mg | ORAL_TABLET | Freq: Every day | ORAL | 3 refills | Status: DC

## 2017-10-29 MED ORDER — METFORMIN HCL 1000 MG PO TABS: 1000.0000 mg | ORAL_TABLET | Freq: Two times a day (BID) | ORAL | 0 refills | Status: DC

## 2017-10-29 MED ORDER — GLUCOSE BLOOD VI STRP: ORAL_STRIP | 3 refills | Status: DC

## 2017-10-29 MED ORDER — AEROCHAMBER PLUS MISC: 2 refills | Status: DC

## 2017-10-29 NOTE — Assessment & Plan Note (Signed)
Chronic stable Cont celexa 20 mg daily. Recheck 6 months

## 2017-10-29 NOTE — Progress Notes (Signed)
Name: Olivia Ramos   MRN: [id number]    DOB: [birth year]   Date:10/29/2017       Progress Note  Subjective  Chief Complaint  Chief Complaint  Patient presents with  . Diabetes    BS Ranges 85-150 usually....has been off Metformin x 2 weeks RAN OUT...BS was 300 during that time.   . Depression    needs refill on Celexa.   . Back Pain    toward tailbone but patient describes it as Colon Pain....needs colonoscopy.     Diabetes  She presents for her follow-up diabetic visit. She has type 2 diabetes mellitus. Her disease course has been stable. There are no hypoglycemic associated symptoms. Pertinent negatives for hypoglycemia include no dizziness, headaches, nervousness/anxiousness or sweats. There are no diabetic associated symptoms. Pertinent negatives for diabetes include no blurred vision, no chest pain, no fatigue, no foot paresthesias, no foot ulcerations, no polydipsia, no polyphagia, no polyuria, no visual change and no weight loss. There are no hypoglycemic complications. Symptoms are stable. There are no diabetic complications. Pertinent negatives for diabetic complications include no CVA, PVD or retinopathy. Risk factors for coronary artery disease include diabetes mellitus, hypertension, dyslipidemia and post-menopausal. Current diabetic treatment includes insulin injections and oral agent (dual therapy). She is compliant with treatment most of the time. Her weight is stable. She is following a generally healthy diet. Meal planning includes avoidance of concentrated sweets and carbohydrate counting. She participates in exercise intermittently. Her home blood glucose trend is fluctuating minimally. Her breakfast blood glucose is taken between 8-9 am. Her breakfast blood glucose range is generally 180-200 mg/dl. An ACE inhibitor/angiotensin II receptor blocker is being taken. She does not see a podiatrist.Eye exam is current.  Depression         This is a chronic problem.  The current  episode started more than 1 year ago.   The onset quality is gradual.   The problem occurs intermittently.  Associated symptoms include no decreased concentration, no fatigue, no helplessness, no hopelessness, does not have insomnia, not irritable, no restlessness, no decreased interest, no myalgias, no headaches, not sad and no suicidal ideas.     The symptoms are aggravated by family issues.  Past treatments include SSRIs - Selective serotonin reuptake inhibitors.  Compliance with treatment is good.  Previous treatment provided mild relief.   Pertinent negatives include no anxiety. Hypertension  This is a chronic problem. The current episode started more than 1 year ago. The problem has been gradually improving since onset. The problem is controlled. Pertinent negatives include no anxiety, blurred vision, chest pain, headaches, malaise/fatigue, neck pain, orthopnea, palpitations, peripheral edema, PND, shortness of breath or sweats. There are no associated agents to hypertension. There are no known risk factors for coronary artery disease. Past treatments include angiotensin blockers. The current treatment provides moderate improvement. There are no compliance problems.  There is no history of angina, kidney disease, CAD/MI, CVA, heart failure, left ventricular hypertrophy, PVD or retinopathy. There is no history of chronic renal disease, a hypertension causing med or renovascular disease.  Asthma  There is no chest tightness, cough, difficulty breathing, frequent throat clearing, hemoptysis, hoarse voice, shortness of breath, sputum production or wheezing. This is a new problem. The problem occurs daily. Pertinent negatives include no chest pain, dyspnea on exertion, ear pain, fever, headaches, heartburn, malaise/fatigue, myalgias, PND, sore throat, sweats or weight loss. Her symptoms are aggravated by nothing. Her symptoms are alleviated by beta-agonist. She reports minimal improvement on treatment. Her  symptoms are not alleviated by beta-agonist and steroid inhaler. Her past medical history is significant for asthma.  Hyperlipidemia  This is a chronic problem. The current episode started more than 1 year ago. The problem is controlled. Recent lipid tests were reviewed and are normal. She has no history of chronic renal disease. Pertinent negatives include no chest pain, focal weakness, myalgias or shortness of breath. Current antihyperlipidemic treatment includes statins. The current treatment provides no improvement of lipids. There are no compliance problems.     Essential hypertension Told need to change irbesartan. Will switch to losartan 100 mg daily. Check renal panel.  Asthma Patient out of albuterol and advair and does not have chamber. Refill Advair 250-50 MCG and ventolin.  Depression Chronic stable Cont celexa 20 mg daily. Recheck 6 months  Mixed hyperlipidemia Chronic Controlled Continue atorvastatin 80 mg. Check lipid panel.   Past Medical History:  Diagnosis Date  . Arthritis   . Diabetes mellitus without complication ([place])   . Hypertension     History reviewed. No pertinent surgical history.  Family History  Problem Relation Age of Onset  . Heart attack Mother   . Diabetes Mother   . Hypertension Father   . Cancer Sister        breast  . Heart attack Brother   . Stroke Sister   . Breast cancer Neg Hx     Social History   Socioeconomic History  . Marital status: Married    Spouse name: Not on file  . Number of children: Not on file  . Years of education: Not on file  . Highest education level: Not on file  Occupational History  . Not on file  Social Needs  . Financial resource strain: Not on file  . Food insecurity:    Worry: Not on file    Inability: Not on file  . Transportation needs:    Medical: Patient refused    Non-medical: Patient refused  Tobacco Use  . Smoking status: Never Smoker  . Smokeless tobacco: Never Used  Substance and  Sexual Activity  . Alcohol use: No    Alcohol/week: 0.0 oz  . Drug use: No  . Sexual activity: Not on file  Lifestyle  . Physical activity:    Days per week: Not on file    Minutes per session: Not on file  . Stress: Not on file  Relationships  . Social connections:    Talks on phone: Not on file    Gets together: Not on file    Attends religious service: Not on file    Active member of club or organization: Not on file    Attends meetings of clubs or organizations: Not on file    Relationship status: Not on file  . Intimate partner violence:    Fear of current or ex partner: Not on file    Emotionally abused: Not on file    Physically abused: Not on file    Forced sexual activity: Not on file  Other Topics Concern  . Not on file  Social History Narrative  . Not on file    Allergies  Allergen Reactions  . Soy Allergy Other (See Comments)    Outpatient Medications Prior to Visit  Medication Sig Dispense Refill  . albuterol (PROVENTIL) (2.5 MG/3ML) 0.083% nebulizer solution Take 3 mLs (2.5 mg total) by nebulization every 6 (six) hours as needed for wheezing or shortness of breath. 75 mL 0  . famotidine-calcium carbonate-magnesium hydroxide (PEPCID COMPLETE)  10-800-165 MG chewable tablet Chew 1 tablet by mouth daily as needed.    Marland Kitchen glucose monitoring kit (FREESTYLE) monitoring kit 1 each by Does not apply route daily. Check glucose once in the morning before breakfast and 1 hour after a meal 1 each 0  . meloxicam (MOBIC) 15 MG tablet Take 15 mg by mouth daily.    . metFORMIN (GLUCOPHAGE) 1000 MG tablet TAKE 1 TABLET (1,000 MG TOTAL) BY MOUTH 2 (TWO) TIMES DAILY WITH A MEAL. 60 tablet 0  . senna-docusate (SENOKOT S) 8.6-50 MG tablet Take 1 tablet by mouth 2 (two) times daily as needed for mild constipation.    Marland Kitchen albuterol (VENTOLIN HFA) 108 (90 Base) MCG/ACT inhaler Inhale 2 puffs into the lungs every 4 (four) hours as needed for wheezing or shortness of breath. 1 Inhaler 5  .  atorvastatin (LIPITOR) 80 MG tablet Take 1 tablet (80 mg total) by mouth daily. 90 tablet 3  . citalopram (CELEXA) 20 MG tablet Take 1 tablet (20 mg total) by mouth daily. 30 tablet 2  . Fluticasone-Salmeterol (ADVAIR) 250-50 MCG/DOSE AEPB Inhale 1 puff into the lungs 2 (two) times daily. 60 each 5  . glucose blood (FREESTYLE LITE) test strip Use as instructed 100 each 3  . Insulin Glargine (LANTUS SOLOSTAR) 100 UNIT/ML Solostar Pen Inject 35 Units into the skin daily at 10 pm. 30 mL 3  . Insulin Pen Needle (PEN NEEDLES) 31G X 8 MM MISC Use as directed 100 each 5  . irbesartan (AVAPRO) 300 MG tablet Take 1 tablet (300 mg total) by mouth daily. 90 tablet 3  . metFORMIN (GLUCOPHAGE) 1000 MG tablet TAKE 1 TABLET (1,000 MG TOTAL) BY MOUTH 2 (TWO) TIMES DAILY WITH A MEAL. 15 tablet 0  . Spacer/Aero-Holding Chambers (AEROCHAMBER PLUS) inhaler Use as instructed 1 each 2  . cephALEXin (KEFLEX) 500 MG capsule      No facility-administered medications prior to visit.     Review of Systems  Constitutional: Negative for chills, fatigue, fever, malaise/fatigue and weight loss.  HENT: Negative for ear discharge, ear pain, hoarse voice and sore throat.   Eyes: Negative for blurred vision.  Respiratory: Negative for cough, hemoptysis, sputum production, shortness of breath and wheezing.   Cardiovascular: Negative for chest pain, dyspnea on exertion, palpitations, orthopnea, leg swelling and PND.  Gastrointestinal: Negative for abdominal pain, blood in stool, constipation, diarrhea, heartburn, melena and nausea.  Genitourinary: Negative for dysuria, frequency, hematuria and urgency.  Musculoskeletal: Negative for back pain, joint pain, myalgias and neck pain.  Skin: Negative for rash.  Neurological: Negative for dizziness, tingling, sensory change, focal weakness and headaches.  Endo/Heme/Allergies: Negative for environmental allergies, polydipsia and polyphagia. Does not bruise/bleed easily.   Psychiatric/Behavioral: Positive for depression. Negative for decreased concentration and suicidal ideas. The patient is not nervous/anxious and does not have insomnia.      Objective  Vitals:   10/29/17 0910  BP: 123/83  Pulse: 73  Resp: 16  SpO2: 97%  Weight: 173 lb (78.5 kg)  Height: _0  (1.6 m)    Physical Exam  Constitutional: She is not irritable. No distress.  HENT:  Head: Normocephalic and atraumatic.  Right Ear: External ear normal.  Left Ear: External ear normal.  Nose: Nose normal.  Mouth/Throat: Oropharynx is clear and moist.  Eyes: Pupils are equal, round, and reactive to light. Conjunctivae and EOM are normal. Right eye exhibits no discharge. Left eye exhibits no discharge.  Neck: Normal range of motion. Neck supple. No  JVD present. No thyromegaly present.  Cardiovascular: Normal rate, regular rhythm, normal heart sounds and intact distal pulses. Exam reveals no gallop and no friction rub.  No murmur heard. Pulmonary/Chest: Effort normal and breath sounds normal. She has no wheezes. She has no rales.  Abdominal: Soft. Bowel sounds are normal. She exhibits no mass. There is no tenderness. There is no guarding.  Musculoskeletal: Normal range of motion. She exhibits no edema.  Lymphadenopathy:    She has no cervical adenopathy.  Neurological: She is alert. She has normal reflexes.  Skin: Skin is warm and dry. She is not diaphoretic.  Nursing note and vitals reviewed.     Assessment & Plan  Problem List Items Addressed This Visit      Cardiovascular and Mediastinum   Essential hypertension    Told need to change irbesartan. Will switch to losartan 100 mg daily. Check renal panel.      Relevant Medications   atorvastatin (LIPITOR) 80 MG tablet   losartan (COZAAR) 50 MG tablet   Other Relevant Orders   Renal Function Panel     Respiratory   Asthma    Patient out of albuterol and advair and does not have chamber. Refill Advair 250-50 MCG and  ventolin.      Relevant Medications   Fluticasone-Salmeterol (ADVAIR) 250-50 MCG/DOSE AEPB   albuterol (VENTOLIN HFA) 108 (90 Base) MCG/ACT inhaler     Other   Depression    Chronic stable Cont celexa 20 mg daily. Recheck 6 months      Relevant Medications   citalopram (CELEXA) 20 MG tablet   Mixed hyperlipidemia    Chronic Controlled Continue atorvastatin 80 mg. Check lipid panel.      Relevant Medications   atorvastatin (LIPITOR) 80 MG tablet   losartan (COZAAR) 50 MG tablet   Other Relevant Orders   Lipid panel    Other Visit Diagnoses    Type 2 diabetes mellitus without complication, with long-term current use of insulin ([place])    -  Primary   Patient has been out of metformen past 2 weeks. Blood glucose fluctuates 80 to 300 mg%. will cont [place] 35 units and resume metformen 500 bid/not skip meals.   Relevant Medications   metFORMIN (GLUCOPHAGE) 1000 MG tablet   Insulin Glargine (LANTUS SOLOSTAR) 100 UNIT/ML Solostar Pen   atorvastatin (LIPITOR) 80 MG tablet   losartan (COZAAR) 50 MG tablet   Lancets (FREESTYLE) lancets   Other Relevant Orders   Renal Function Panel   Lipid panel   Hemoglobin A1c   Microalbuminuria          Meds ordered this encounter  Medications  . Fluticasone-Salmeterol (ADVAIR) 250-50 MCG/DOSE AEPB    Sig: Inhale 1 puff into the lungs 2 (two) times daily.    Dispense:  60 each    Refill:  5  . albuterol (VENTOLIN HFA) 108 (90 Base) MCG/ACT inhaler    Sig: Inhale 2 puffs into the lungs every 4 (four) hours as needed for wheezing or shortness of breath.    Dispense:  1 Inhaler    Refill:  5  . Spacer/Aero-Holding Chambers (AEROCHAMBER PLUS) inhaler    Sig: Use as instructed    Dispense:  1 each    Refill:  2  . Insulin Pen Needle (PEN NEEDLES) 31G X 8 MM MISC    Sig: Use as directed    Dispense:  100 each    Refill:  5  . metFORMIN (GLUCOPHAGE) 1000 MG tablet  Sig: Take 1 tablet (1,000 mg total) by mouth 2 (two) times daily with a  meal.    Dispense:  15 tablet    Refill:  0  . glucose blood (FREESTYLE LITE) test strip    Sig: Use as instructed    Dispense:  100 each    Refill:  3  . citalopram (CELEXA) 20 MG tablet    Sig: Take 1 tablet (20 mg total) by mouth daily.    Dispense:  30 tablet    Refill:  2  . Insulin Glargine (LANTUS SOLOSTAR) 100 UNIT/ML Solostar Pen    Sig: Inject 35 Units into the skin daily at 10 pm.    Dispense:  30 mL    Refill:  3  . atorvastatin (LIPITOR) 80 MG tablet    Sig: Take 1 tablet (80 mg total) by mouth daily.    Dispense:  90 tablet    Refill:  3  . losartan (COZAAR) 50 MG tablet    Sig: Take 1 tablet (50 mg total) by mouth daily.    Dispense:  90 tablet    Refill:  1  . Lancets (FREESTYLE) lancets    Sig: Use as instructed    Dispense:  100 each    Refill:  12      Dr. Macon [facility]  10/29/17

## 2017-10-29 NOTE — Assessment & Plan Note (Addendum)
Told need to change irbesartan. Will switch to losartan 100 mg daily. Check renal panel.

## 2017-10-29 NOTE — Assessment & Plan Note (Signed)
Patient out of albuterol and advair and does not have chamber. Refill Advair 250-50 MCG and ventolin.

## 2017-10-29 NOTE — Assessment & Plan Note (Signed)
Chronic Controlled Continue atorvastatin 80 mg. Check lipid panel.

## 2017-10-30 LAB — RENAL FUNCTION PANEL
Albumin: 4.3 g/dL (ref 3.6–4.8)
BUN/Creatinine Ratio: 19 (ref 12–28)
BUN: 19 mg/dL (ref 8–27)
CO2: 22 mmol/L (ref 20–29)
Calcium: 9.6 mg/dL (ref 8.7–10.3)
Chloride: 104 mmol/L (ref 96–106)
Creatinine, Ser: 1 mg/dL (ref 0.57–1.00)
GFR calc Af Amer: 70 mL/min/{1.73_m2} (ref >59)
GFR calc non Af Amer: 61 mL/min/{1.73_m2} (ref >59)
Glucose: 175 mg/dL — ABNORMAL HIGH (ref 65–99)
Phosphorus: 3.8 mg/dL (ref 2.5–4.5)
Potassium: 4.7 mmol/L (ref 3.5–5.2)
Sodium: 142 mmol/L (ref 134–144)

## 2017-10-30 LAB — LIPID PANEL
Chol/HDL Ratio: 2.3 ratio (ref 0.0–4.4)
Cholesterol, Total: 107 mg/dL (ref 100–199)
HDL: 46 mg/dL (ref >39)
LDL Calculated: 44 mg/dL (ref 0–99)
Triglycerides: 85 mg/dL (ref 0–149)
VLDL Cholesterol Cal: 17 mg/dL (ref 5–40)

## 2017-10-30 LAB — HEMOGLOBIN A1C
Est. average glucose Bld gHb Est-mCnc: 206 mg/dL
Hgb A1c MFr Bld: 8.8 % — ABNORMAL HIGH (ref 4.8–5.6)

## 2017-11-20 ENCOUNTER — Other Ambulatory Visit: Payer: Self-pay | Admitting: Family Medicine

## 2017-12-14 ENCOUNTER — Other Ambulatory Visit: Payer: Self-pay | Admitting: Family Medicine

## 2018-01-08 ENCOUNTER — Other Ambulatory Visit: Payer: Self-pay | Admitting: Family Medicine

## 2018-01-14 ENCOUNTER — Other Ambulatory Visit: Payer: Self-pay | Admitting: Family Medicine

## 2018-01-21 ENCOUNTER — Ambulatory Visit
Admission: RE | Admit: 2018-01-21 | Discharge: 2018-01-21 | Disposition: A | Discharge status: Home / Self Care | Payer: BLUE CROSS/BLUE SHIELD | Source: Ambulatory Visit | Attending: Family Medicine | Admitting: Family Medicine

## 2018-01-21 ENCOUNTER — Ambulatory Visit: Payer: BLUE CROSS/BLUE SHIELD | Admitting: Family Medicine

## 2018-01-21 ENCOUNTER — Encounter: Payer: Self-pay | Admitting: Family Medicine

## 2018-01-21 VITALS — BP 124/79 | HR 96 | Temp 98.6°F | Resp 16 | Ht 63.0 in | Wt 177.9 lb

## 2018-01-21 DIAGNOSIS — J4521 Mild intermittent asthma with (acute) exacerbation: Secondary | ICD-10-CM | POA: Diagnosis not present

## 2018-01-21 MED ORDER — PREDNISONE 10 MG PO TABS: 10.0000 mg | ORAL_TABLET | Freq: Every day | ORAL | 0 refills | Status: DC

## 2018-01-21 MED ORDER — IPRATROPIUM-ALBUTEROL 0.5-2.5 (3) MG/3ML IN SOLN
3.0000 mL | Freq: Once | RESPIRATORY_TRACT | Status: AC
  Administered 2018-01-21: 3 mL via RESPIRATORY_TRACT

## 2018-01-21 MED ORDER — FLUTICASONE-SALMETEROL 250-50 MCG/DOSE IN AEPB: 1.0000 | INHALATION_SPRAY | Freq: Two times a day (BID) | RESPIRATORY_TRACT | 5 refills | Status: DC

## 2018-01-21 MED ORDER — AZITHROMYCIN 250 MG PO TABS: ORAL_TABLET | ORAL | 0 refills | Status: DC

## 2018-01-21 NOTE — Progress Notes (Signed)
Date:  01/21/2018   Name:  Olivia Ramos   DOB:  24-Sep-1955   MRN:  [id number]   Chief Complaint: Cough (1 week of cough ans asthma flare up. Wheezing and waking up all night to do treatments ) Cough  This is a new problem. The current episode started in the past 7 days (3 days ago). The problem has been gradually worsening. The cough is productive of purulent sputum (green). Associated symptoms include headaches, nasal congestion, postnasal drip, rhinorrhea and wheezing. Pertinent negatives include no chest pain, chills, ear congestion, ear pain, eye redness, fever, heartburn, hemoptysis, myalgias, rash, sore throat, shortness of breath, sweats or weight loss. Nothing aggravates the symptoms. She has tried a beta-agonist inhaler and steroid inhaler for the symptoms. The treatment provided mild relief. Her past medical history is significant for asthma. There is no history of environmental allergies.     Review of Systems  Constitutional: Negative.  Negative for chills, fatigue, fever, unexpected weight change and weight loss.  HENT: Positive for postnasal drip and rhinorrhea. Negative for congestion, ear discharge, ear pain, sinus pressure, sneezing and sore throat.   Eyes: Negative for photophobia, pain, discharge, redness and itching.  Respiratory: Positive for cough and wheezing. Negative for hemoptysis, shortness of breath and stridor.   Cardiovascular: Negative for chest pain.  Gastrointestinal: Negative for abdominal pain, blood in stool, constipation, diarrhea, heartburn, nausea and vomiting.  Endocrine: Negative for cold intolerance, heat intolerance, polydipsia, polyphagia and polyuria.  Genitourinary: Negative for dysuria, flank pain, frequency, hematuria, menstrual problem, pelvic pain, urgency, vaginal bleeding and vaginal discharge.  Musculoskeletal: Negative for arthralgias, back pain and myalgias.  Skin: Negative for rash.  Allergic/Immunologic: Negative for environmental  allergies and food allergies.  Neurological: Positive for headaches. Negative for dizziness, weakness, light-headedness and numbness.  Hematological: Negative for adenopathy. Does not bruise/bleed easily.  Psychiatric/Behavioral: Negative for dysphoric mood. The patient is not nervous/anxious.     Patient Active Problem List   Diagnosis Date Noted  . Mixed hyperlipidemia 08/03/2017  . Background diabetic retinopathy ([place]) 07/01/2017  . Carpal tunnel syndrome of left wrist 05/31/2017  . Onychomycosis 05/17/2017  . 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 Diabetic neuropathy ([place]) 08/21/2016  . Constipation 07/02/2016  . Asthma 07/02/2016  . GERD (gastroesophageal reflux disease) 02/18/2015  . History of colon polyps 02/18/2015  . Osteoarthritis of both feet 02/18/2015  . Hyperlipidemia 11/26/2014  . Diabetes mellitus with microalbuminuria ([place]) 11/23/2014  . Essential hypertension 11/23/2014  . Depression 11/23/2014  . Obesity (BMI 30.0-34.9) 11/23/2014    Allergies  Allergen Reactions  . Soy Allergy Other (See Comments)    History reviewed. No pertinent surgical history.  Social History   Tobacco Use  . Smoking status: Never Smoker  . Smokeless tobacco: Never Used  Substance Use Topics  . Alcohol use: No    Alcohol/week: 0.0 standard drinks  . Drug use: No     Medication list has been reviewed and updated.  Current Meds  Medication Sig  . albuterol (PROVENTIL) (2.5 MG/3ML) 0.083% nebulizer solution Take 3 mLs (2.5 mg total) by nebulization every 6 (six) hours as needed for wheezing or shortness of breath.  Marland Kitchen albuterol (VENTOLIN HFA) 108 (90 Base) MCG/ACT inhaler Inhale 2 puffs into the lungs every 4 (four) hours as needed for wheezing or shortness of breath.  Marland Kitchen atorvastatin (LIPITOR) 80 MG tablet Take 1 tablet (80 mg total) by mouth daily.  . famotidine-calcium carbonate-magnesium hydroxide (PEPCID COMPLETE) 10-800-165 MG chewable tablet Chew 1  tablet by mouth daily as  needed.  . Fluticasone-Salmeterol (ADVAIR) 250-50 MCG/DOSE AEPB Inhale 1 puff into the lungs 2 (two) times daily.  Marland Kitchen glucose blood (FREESTYLE LITE) test strip Use as instructed  . glucose monitoring kit (FREESTYLE) monitoring kit 1 each by Does not apply route daily. Check glucose once in the morning before breakfast and 1 hour after a meal  . Insulin Glargine (BASAGLAR KWIKPEN) 100 UNIT/ML SOPN INJECT 30 UNITS INTO THE SKIN DAILY AT 10 PM.  . Insulin Glargine (LANTUS SOLOSTAR) 100 UNIT/ML Solostar Pen Inject 35 Units into the skin daily at 10 pm.  . Insulin Pen Needle (PEN NEEDLES) 31G X 8 MM MISC Use as directed  . Lancets (FREESTYLE) lancets Use as instructed  . losartan (COZAAR) 50 MG tablet Take 1 tablet (50 mg total) by mouth daily.  . meloxicam (MOBIC) 15 MG tablet Take 15 mg by mouth daily.  . metFORMIN (GLUCOPHAGE) 1000 MG tablet Take 1 tablet (1,000 mg total) by mouth 2 (two) times daily with a meal.  . metFORMIN (GLUCOPHAGE) 1000 MG tablet TAKE 1 TABLET (1,000 MG TOTAL) BY MOUTH 2 (TWO) TIMES DAILY WITH A MEAL.  Marland Kitchen senna-docusate (SENOKOT S) 8.6-50 MG tablet Take 1 tablet by mouth 2 (two) times daily as needed for mild constipation.  Marland Kitchen Spacer/Aero-Holding Chambers (AEROCHAMBER PLUS) inhaler Use as instructed  . [DISCONTINUED] Fluticasone-Salmeterol (ADVAIR) 250-50 MCG/DOSE AEPB Inhale 1 puff into the lungs 2 (two) times daily.    PHQ 2/9 Scores 10/29/2017 07/12/2017 01/21/2017  PHQ - 2 Score 2 0 0  PHQ- 9 Score 9 0 -    Physical Exam  Constitutional: No distress.  HENT:  Head: Normocephalic and atraumatic.  Right Ear: External ear normal.  Left Ear: External ear normal.  Nose: Nose normal.  Mouth/Throat: Oropharynx is clear and moist.  Eyes: Pupils are equal, round, and reactive to light. Conjunctivae and EOM are normal. Right eye exhibits no discharge. Left eye exhibits no discharge.  Neck: Normal range of motion. Neck supple. No JVD present. No thyromegaly present.    Cardiovascular: Normal rate, regular rhythm, normal heart sounds and intact distal pulses. Exam reveals no gallop and no friction rub.  No murmur heard. Pulmonary/Chest: Effort normal. No apnea and no tachypnea. No respiratory distress. She has decreased breath sounds in the right lower field. She has wheezes. She has rhonchi. She has no rales.  Abdominal: Soft. Bowel sounds are normal. She exhibits no mass. There is no tenderness. There is no guarding.  Musculoskeletal: Normal range of motion. She exhibits no edema.  Lymphadenopathy:    She has no cervical adenopathy.  Neurological: She is alert. She has normal reflexes.  Skin: Skin is warm and dry. She is not diaphoretic.  Nursing note and vitals reviewed.   BP 124/79   Pulse 96   Temp 98.6 F (37 C)   Resp 16   Ht 5' 3"  (1.6 m)   Wt 177 lb 14.4 oz (80.7 kg)   SpO2 96%   BMI 31.51 kg/m   Assessment and Plan:  1. Mild intermittent asthmatic bronchitis with acute exacerbation Acute persistent. Duoneb nebulization with improvement. Initiated prednisone and azithromycin. Continue albuterol nebulization.Chest negative for pneumonia. - azithromycin (ZITHROMAX) 250 MG tablet; 2 today then 1 a day for 4 days  Dispense: 6 tablet; Refill: 0 - Fluticasone-Salmeterol (ADVAIR) 250-50 MCG/DOSE AEPB; Inhale 1 puff into the lungs 2 (two) times daily.  Dispense: 60 each; Refill: 5 - predniSONE (DELTASONE) 10 MG tablet; Take 1  tablet (10 mg total) by mouth daily with breakfast.  Dispense: 30 tablet; Refill: 0 - DG Chest 2 View; Future - ipratropium-albuterol (DUONEB) 0.5-2.5 (3) MG/3ML nebulizer solution 3 mL   Dr. [facility]  01/21/2018

## 2018-01-21 NOTE — Addendum Note (Signed)
Addended by: Elizabeth Sauer C on: 01/21/2018 12:13 PM   Modules accepted: Level of Service

## 2018-01-25 ENCOUNTER — Encounter: Payer: BLUE CROSS/BLUE SHIELD | Admitting: Family Medicine

## 2018-02-12 ENCOUNTER — Other Ambulatory Visit: Payer: Self-pay | Admitting: Family Medicine

## 2018-02-13 ENCOUNTER — Other Ambulatory Visit: Payer: Self-pay | Admitting: Family Medicine

## 2018-03-01 ENCOUNTER — Other Ambulatory Visit: Payer: Self-pay | Admitting: Family Medicine

## 2018-03-02 ENCOUNTER — Other Ambulatory Visit: Payer: Self-pay | Admitting: Family Medicine

## 2018-03-02 DIAGNOSIS — J4521 Mild intermittent asthma with (acute) exacerbation: Secondary | ICD-10-CM

## 2018-04-01 ENCOUNTER — Ambulatory Visit: Payer: BLUE CROSS/BLUE SHIELD | Admitting: Family Medicine

## 2018-04-04 ENCOUNTER — Ambulatory Visit: Payer: BLUE CROSS/BLUE SHIELD | Admitting: Family Medicine

## 2018-04-04 ENCOUNTER — Encounter: Payer: Self-pay | Admitting: Family Medicine

## 2018-04-04 VITALS — BP 112/70 | HR 88 | Ht 63.0 in | Wt 172.0 lb

## 2018-04-04 DIAGNOSIS — Z9119 Patient's noncompliance with other medical treatment and regimen: Secondary | ICD-10-CM

## 2018-04-04 DIAGNOSIS — E782 Mixed hyperlipidemia: Secondary | ICD-10-CM

## 2018-04-04 DIAGNOSIS — J4530 Mild persistent asthma, uncomplicated: Secondary | ICD-10-CM

## 2018-04-04 DIAGNOSIS — I10 Essential (primary) hypertension: Secondary | ICD-10-CM

## 2018-04-04 DIAGNOSIS — Z23 Encounter for immunization: Secondary | ICD-10-CM | POA: Diagnosis not present

## 2018-04-04 DIAGNOSIS — E119 Type 2 diabetes mellitus without complications: Secondary | ICD-10-CM

## 2018-04-04 DIAGNOSIS — Z91199 Patient's noncompliance with other medical treatment and regimen due to unspecified reason: Secondary | ICD-10-CM

## 2018-04-04 DIAGNOSIS — J4521 Mild intermittent asthma with (acute) exacerbation: Secondary | ICD-10-CM

## 2018-04-04 DIAGNOSIS — Z794 Long term (current) use of insulin: Secondary | ICD-10-CM

## 2018-04-04 MED ORDER — FLUTICASONE-SALMETEROL 250-50 MCG/DOSE IN AEPB: 1.0000 | INHALATION_SPRAY | Freq: Two times a day (BID) | RESPIRATORY_TRACT | 5 refills | Status: AC

## 2018-04-04 MED ORDER — ATORVASTATIN CALCIUM 80 MG PO TABS: 80.0000 mg | ORAL_TABLET | Freq: Every day | ORAL | 1 refills | Status: AC

## 2018-04-04 MED ORDER — GLUCOSE BLOOD VI STRP: ORAL_STRIP | 3 refills | Status: AC

## 2018-04-04 MED ORDER — METFORMIN HCL 1000 MG PO TABS: 1000.0000 mg | ORAL_TABLET | Freq: Two times a day (BID) | ORAL | 1 refills | Status: DC

## 2018-04-04 MED ORDER — INSULIN GLARGINE 100 UNIT/ML SOLOSTAR PEN: 35.0000 [IU] | PEN_INJECTOR | Freq: Every day | SUBCUTANEOUS | 3 refills | Status: DC

## 2018-04-04 MED ORDER — LOSARTAN POTASSIUM 50 MG PO TABS: 50.0000 mg | ORAL_TABLET | Freq: Every day | ORAL | 1 refills | Status: AC

## 2018-04-04 MED ORDER — FREESTYLE SYSTEM KIT: 1.0000 | PACK | Freq: Every day | 0 refills | Status: AC

## 2018-04-04 MED ORDER — FREESTYLE LANCETS MISC: 12 refills | Status: AC

## 2018-04-04 MED ORDER — ALBUTEROL SULFATE HFA 108 (90 BASE) MCG/ACT IN AERS: 2.0000 | INHALATION_SPRAY | RESPIRATORY_TRACT | 5 refills | Status: DC | PRN

## 2018-04-04 MED ORDER — PEN NEEDLES 31G X 8 MM MISC: 5 refills | Status: AC

## 2018-04-04 MED ORDER — ALBUTEROL SULFATE (2.5 MG/3ML) 0.083% IN NEBU: 2.5000 mg | INHALATION_SOLUTION | Freq: Four times a day (QID) | RESPIRATORY_TRACT | 3 refills | Status: DC | PRN

## 2018-04-04 NOTE — Progress Notes (Signed)
Date:  04/04/2018   Name:  Olivia Ramos   DOB:  1955/07/24   MRN:  [id number]   Chief Complaint: Diabetes (taking basaglar- has been off med x 3 weeks); Hypertension; Hyperlipidemia; COPD; and Flu Vaccine  Diabetes  She presents for her follow-up diabetic visit. She has type 2 diabetes mellitus. Her disease course has been stable. There are no hypoglycemic associated symptoms. Pertinent negatives for hypoglycemia include no dizziness, headaches, nervousness/anxiousness or sweats. Pertinent negatives for diabetes include no blurred vision, no chest pain, no fatigue, no foot paresthesias, no foot ulcerations, no polydipsia, no polyphagia, no polyuria, no visual change, no weakness and no weight loss. There are no hypoglycemic complications. Symptoms are stable. There are no diabetic complications. Pertinent negatives for diabetic complications include no CVA, PVD or retinopathy. Risk factors for coronary artery disease include diabetes mellitus, dyslipidemia, hypertension and obesity. Current diabetic treatment includes oral agent (monotherapy). She is following a generally healthy diet. Meal planning includes avoidance of concentrated sweets and carbohydrate counting. Her home blood glucose trend is decreasing steadily. An ACE inhibitor/angiotensin II receptor blocker is being taken. She does not see a podiatrist.Eye exam is not current.  Hypertension  This is a chronic problem. The current episode started yesterday. The problem has been gradually worsening since onset. The problem is controlled. Pertinent negatives include no anxiety, blurred vision, chest pain, headaches, malaise/fatigue, neck pain, orthopnea, palpitations, peripheral edema, PND, shortness of breath or sweats. There are no associated agents to hypertension. Risk factors for coronary artery disease include diabetes mellitus and post-menopausal state. Past treatments include angiotensin blockers. The current treatment provides  moderate improvement. Compliance problems include medication cost (patient not taking insulin).  There is no history of angina, kidney disease, CAD/MI, CVA, heart failure, left ventricular hypertrophy, PVD or retinopathy. There is no history of chronic renal disease, a hypertension causing med or renovascular disease.  Hyperlipidemia  This is a chronic problem. The current episode started more than 1 year ago. The problem is controlled. Recent lipid tests were reviewed and are normal. Exacerbating diseases include diabetes and obesity. She has no history of chronic renal disease. Pertinent negatives include no chest pain, focal sensory loss, focal weakness, leg pain, myalgias or shortness of breath. Current antihyperlipidemic treatment includes statins. The current treatment provides moderate improvement of lipids. There are no compliance problems.  Risk factors for coronary artery disease include diabetes mellitus, dyslipidemia and hypertension.  COPD  There is no chest tightness, cough, difficulty breathing, frequent throat clearing, hemoptysis, hoarse voice, shortness of breath, sputum production or wheezing. The current episode started in the past 7 days. The problem occurs daily. The problem has been gradually improving. Associated symptoms include ear congestion. Pertinent negatives include no appetite change, chest pain, dyspnea on exertion, ear pain, fever, headaches, malaise/fatigue, myalgias, nasal congestion, PND, rhinorrhea, sneezing, sore throat, sweats or weight loss. Her symptoms are alleviated by beta-agonist and steroid inhaler. She reports moderate improvement on treatment. Her past medical history is significant for COPD.    Review of Systems  Constitutional: Negative.  Negative for appetite change, chills, fatigue, fever, malaise/fatigue, unexpected weight change and weight loss.  HENT: Negative for congestion, ear discharge, ear pain, hoarse voice, rhinorrhea, sinus pressure, sneezing  and sore throat.   Eyes: Negative for blurred vision, photophobia, pain, discharge, redness and itching.  Respiratory: Negative for cough, hemoptysis, sputum production, shortness of breath, wheezing and stridor.   Cardiovascular: Negative for chest pain, dyspnea on exertion, palpitations, orthopnea and PND.  Gastrointestinal: Negative for abdominal pain, blood in stool, constipation, diarrhea, nausea and vomiting.  Endocrine: Negative for cold intolerance, heat intolerance, polydipsia, polyphagia and polyuria.  Genitourinary: Negative for dysuria, flank pain, frequency, hematuria, menstrual problem, pelvic pain, urgency, vaginal bleeding and vaginal discharge.  Musculoskeletal: Negative for arthralgias, back pain, myalgias and neck pain.  Skin: Negative for rash.  Allergic/Immunologic: Negative for environmental allergies and food allergies.  Neurological: Negative for dizziness, focal weakness, weakness, light-headedness, numbness and headaches.  Hematological: Negative for adenopathy. Does not bruise/bleed easily.  Psychiatric/Behavioral: Negative for dysphoric mood. The patient is not nervous/anxious.     Patient Active Problem List   Diagnosis Date Noted  . Mixed hyperlipidemia 08/03/2017  . Background diabetic retinopathy ([place]) 07/01/2017  . Carpal tunnel syndrome of left wrist 05/31/2017  . Onychomycosis 05/17/2017  . 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 Diabetic neuropathy ([place]) 08/21/2016  . Constipation 07/02/2016  . Asthma 07/02/2016  . GERD (gastroesophageal reflux disease) 02/18/2015  . History of colon polyps 02/18/2015  . Osteoarthritis of both feet 02/18/2015  . Hyperlipidemia 11/26/2014  . 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 Essential hypertension 11/23/2014  . Depression 11/23/2014  . Obesity (BMI 30.0-34.9) 11/23/2014    Allergies  Allergen Reactions  . Soy Allergy Other (See Comments)    No past surgical history on file.  Social  History   Tobacco Use  . Smoking status: Never Smoker  . Smokeless tobacco: Never Used  Substance Use Topics  . Alcohol use: No    Alcohol/week: 0.0 standard drinks  . Drug use: No     Medication list has been reviewed and updated.  Current Meds  Medication Sig  . albuterol (PROVENTIL) (2.5 MG/3ML) 0.083% nebulizer solution Take 3 mLs (2.5 mg total) by nebulization every 6 (six) hours as needed for wheezing or shortness of breath.  Marland Kitchen albuterol (VENTOLIN HFA) 108 (90 Base) MCG/ACT inhaler Inhale 2 puffs into the lungs every 4 (four) hours as needed for wheezing or shortness of breath.  Marland Kitchen atorvastatin (LIPITOR) 80 MG tablet Take 1 tablet (80 mg total) by mouth daily.  . famotidine-calcium carbonate-magnesium hydroxide (PEPCID COMPLETE) 10-800-165 MG chewable tablet Chew 1 tablet by mouth daily as needed.  . Fluticasone-Salmeterol (ADVAIR) 250-50 MCG/DOSE AEPB Inhale 1 puff into the lungs 2 (two) times daily.  . Insulin Glargine (BASAGLAR KWIKPEN) 100 UNIT/ML SOPN INJECT 30 UNITS INTO THE SKIN DAILY AT 10 PM.  . Insulin Pen Needle (PEN NEEDLES) 31G X 8 MM MISC Use as directed  . Lancets (FREESTYLE) lancets Use as instructed  . losartan (COZAAR) 50 MG tablet Take 1 tablet (50 mg total) by mouth daily.  . meloxicam (MOBIC) 15 MG tablet Take 15 mg by mouth daily.  . metFORMIN (GLUCOPHAGE) 1000 MG tablet Take 1 tablet (1,000 mg total) by mouth 2 (two) times daily with a meal.  . Spacer/Aero-Holding Chambers (AEROCHAMBER PLUS) inhaler Use as instructed  . [DISCONTINUED] citalopram (CELEXA) 20 MG tablet Take 1 tablet (20 mg total) by mouth daily.    PHQ 2/9 Scores 10/29/2017 07/12/2017 01/21/2017  PHQ - 2 Score 2 0 0  PHQ- 9 Score 9 0 -    Physical Exam Vitals signs and nursing note reviewed.  Constitutional:      General: She is not in acute distress.    Appearance: She is not diaphoretic.  HENT:     Head: Normocephalic and atraumatic.     Right Ear: External ear normal.     Left  Ear: External  ear normal.     Nose: Nose normal.  Eyes:     General:        Right eye: No discharge.        Left eye: No discharge.     Conjunctiva/sclera: Conjunctivae normal.     Pupils: Pupils are equal, round, and reactive to light.  Neck:     Musculoskeletal: Normal range of motion and neck supple.     Thyroid: No thyromegaly.     Vascular: No JVD.  Cardiovascular:     Rate and Rhythm: Normal rate and regular rhythm.     Pulses: No decreased pulses.     Heart sounds: Normal heart sounds, S1 normal and S2 normal. No murmur. No systolic murmur. No diastolic murmur. No friction rub. No gallop. No S3 or S4 sounds.   Pulmonary:     Effort: Pulmonary effort is normal.     Breath sounds: Normal breath sounds. No wheezing, rhonchi or rales.  Abdominal:     General: Bowel sounds are normal.     Palpations: Abdomen is soft. There is no mass.     Tenderness: There is no abdominal tenderness. There is no guarding.  Musculoskeletal: Normal range of motion.     Right lower leg: No edema.     Left lower leg: No edema.  Lymphadenopathy:     Cervical: No cervical adenopathy.  Skin:    General: Skin is warm and dry.  Neurological:     Mental Status: She is alert.     Deep Tendon Reflexes: Reflexes are normal and symmetric.     BP 112/70   Pulse 88   Ht _0  (1.6 m)   Wt 172 lb (78 kg)   BMI 30.47 kg/m   Assessment and Plan: 1. Essential hypertension Chronic.  Controlled.  Continue losartan 50 mg once a day.  Will check renal function panel. - losartan (COZAAR) 50 MG tablet; Take 1 tablet (50 mg total) by mouth daily.  Dispense: 90 tablet; Refill: 1 - Renal function panel  2. Mild intermittent asthmatic bronchitis with acute exacerbation Chronic.  Controlled.  Out intermittent.  Use Advair 2 50-50 on a daily basis with rescue albuterol - Fluticasone-Salmeterol (ADVAIR) 250-50 MCG/DOSE AEPB; Inhale 1 puff into the lungs 2 (two) times daily.  Dispense: 60 each; Refill: 5 - Lipid  panel  3. Mild persistent asthma without complication Mild intermittent controlled continue albuterol inhaler as needed seen. - albuterol (VENTOLIN HFA) 108 (90 Base) MCG/ACT inhaler; Inhale 2 puffs into the lungs every 4 (four) hours as needed for wheezing or shortness of breath.  Dispense: 1 Inhaler; Refill: 5 - albuterol (PROVENTIL) (2.5 MG/3ML) 0.083% nebulizer solution; Take 3 mLs (2.5 mg total) by nebulization every 6 (six) hours as needed for wheezing or shortness of breath.  Dispense: 75 mL; Refill: 3  4. Type 2 diabetes mellitus without complication, with long-term current use of insulin (HCC) Chronic uncontrolled patient has not been taking Lantus and has not been checking lately glucose.  Resume insulin glargine 5 units in the evening.  Continue metformin 1 g twice a day.  Patient has been over the importance of daily monitoring and absolute continuance of medication.  Will check renal function panel, A1c, and and microalbuminuria. - Lancets (FREESTYLE) lancets; Use as instructed  Dispense: 100 each; Refill: 12 - Insulin Glargine (LANTUS SOLOSTAR) 100 UNIT/ML Solostar Pen; Inject 35 Units into the skin daily at 10 pm.  Dispense: 5 pen; Refill: 3 - metFORMIN (GLUCOPHAGE) 1000  MG tablet; Take 1 tablet (1,000 mg total) by mouth 2 (two) times daily with a meal.  Dispense: 180 tablet; Refill: 1 - glucose monitoring kit (FREESTYLE) monitoring kit; 1 each by Does not apply route daily. Check glucose once in the morning before breakfast and 1 hour after a meal  Dispense: 1 each; Refill: 0 - Insulin Pen Needle (PEN NEEDLES) 31G X 8 MM MISC; Use as directed  Dispense: 100 each; Refill: 5 - glucose blood (FREESTYLE LITE) test strip; Use as instructed  Dispense: 100 each; Refill: 3 - Renal function panel - Hemoglobin A1c - Microalbumin / creatinine urine ratio  5. Mixed hyperlipidemia Chronic controlled.  Continue atorvastatin 80 mg once a day. - atorvastatin (LIPITOR) 80 MG tablet; Take 1 tablet  (80 mg total) by mouth daily.  Dispense: 90 tablet; Refill: 1  6. Noncompliance Patient not continued  her insulin glargine.  Reinforced importance of continuing dedication on a regular basis.  7. Flu vaccine need Discussed and administered - Flu Vaccine QUAD 6+ mos PF IM (Fluarix Quad PF) 8.  Patient will be rechecked in 1 week which time we will review status of her heart palpitations.  In the meantime we will check a renal function to assess potassium situation.

## 2018-04-05 ENCOUNTER — Other Ambulatory Visit: Payer: Self-pay

## 2018-04-05 ENCOUNTER — Other Ambulatory Visit: Payer: Self-pay | Admitting: Family Medicine

## 2018-04-05 DIAGNOSIS — E119 Type 2 diabetes mellitus without complications: Secondary | ICD-10-CM

## 2018-04-05 DIAGNOSIS — Z794 Long term (current) use of insulin: Principal | ICD-10-CM

## 2018-04-05 MED ORDER — INSULIN GLARGINE 100 UNIT/ML SOLOSTAR PEN: 35.0000 [IU] | PEN_INJECTOR | Freq: Every day | SUBCUTANEOUS | 3 refills | Status: DC

## 2018-04-05 MED ORDER — BASAGLAR KWIKPEN 100 UNIT/ML ~~LOC~~ SOPN: 35.0000 [IU] | PEN_INJECTOR | Freq: Every day | SUBCUTANEOUS | 3 refills | Status: DC

## 2018-04-14 ENCOUNTER — Ambulatory Visit: Payer: BLUE CROSS/BLUE SHIELD | Admitting: Family Medicine

## 2018-08-04 ENCOUNTER — Ambulatory Visit: Payer: BLUE CROSS/BLUE SHIELD | Admitting: Family Medicine

## 2018-08-23 ENCOUNTER — Ambulatory Visit: Payer: BLUE CROSS/BLUE SHIELD | Admitting: Family Medicine

## 2018-09-20 ENCOUNTER — Other Ambulatory Visit: Payer: Self-pay | Admitting: Family Medicine

## 2018-09-20 DIAGNOSIS — E119 Type 2 diabetes mellitus without complications: Secondary | ICD-10-CM

## 2018-09-22 ENCOUNTER — Ambulatory Visit: Payer: BLUE CROSS/BLUE SHIELD | Admitting: Family Medicine

## 2018-10-01 ENCOUNTER — Other Ambulatory Visit: Payer: Self-pay | Admitting: Family Medicine

## 2018-10-01 DIAGNOSIS — E119 Type 2 diabetes mellitus without complications: Secondary | ICD-10-CM

## 2018-10-29 ENCOUNTER — Other Ambulatory Visit: Payer: Self-pay | Admitting: Family Medicine

## 2018-10-29 DIAGNOSIS — E119 Type 2 diabetes mellitus without complications: Secondary | ICD-10-CM

## 2018-11-10 ENCOUNTER — Telehealth: Payer: Self-pay

## 2018-11-10 NOTE — Telephone Encounter (Signed)
Patient not accepting calls at this time.

## 2018-11-10 NOTE — Telephone Encounter (Signed)
Patient canceled her appt in June. She needs to make a follow up for her diabetes.  Thankyou.

## 2018-11-15 ENCOUNTER — Other Ambulatory Visit: Payer: Self-pay | Admitting: Family Medicine

## 2018-11-15 DIAGNOSIS — I10 Essential (primary) hypertension: Secondary | ICD-10-CM

## 2018-11-15 DIAGNOSIS — E782 Mixed hyperlipidemia: Secondary | ICD-10-CM

## 2018-11-30 ENCOUNTER — Other Ambulatory Visit: Payer: Self-pay | Admitting: Family Medicine

## 2018-11-30 DIAGNOSIS — E782 Mixed hyperlipidemia: Secondary | ICD-10-CM

## 2019-02-27 ENCOUNTER — Other Ambulatory Visit: Payer: Self-pay | Admitting: Family Medicine

## 2019-02-27 DIAGNOSIS — J4521 Mild intermittent asthma with (acute) exacerbation: Secondary | ICD-10-CM

## 2019-06-10 ENCOUNTER — Ambulatory Visit
Admission: EM | Admit: 2019-06-10 | Discharge: 2019-06-10 | Disposition: A | Discharge status: Home / Self Care | Payer: BC Managed Care – PPO | Attending: Family Medicine | Admitting: Family Medicine

## 2019-06-10 ENCOUNTER — Encounter: Payer: Self-pay | Admitting: Emergency Medicine

## 2019-06-10 ENCOUNTER — Other Ambulatory Visit: Payer: Self-pay

## 2019-06-10 DIAGNOSIS — N390 Urinary tract infection, site not specified: Secondary | ICD-10-CM | POA: Insufficient documentation

## 2019-06-10 LAB — URINALYSIS, COMPLETE (UACMP) WITH MICROSCOPIC
Bilirubin Urine: NEGATIVE
Glucose, UA: NEGATIVE mg/dL
Ketones, ur: NEGATIVE mg/dL
Nitrite: NEGATIVE
Protein, ur: 100 mg/dL — AB
Specific Gravity, Urine: 1.015 (ref 1.005–1.030)
WBC, UA: >50 WBC/hpf (ref 0–5)
pH: 6 (ref 5.0–8.0)

## 2019-06-10 MED ORDER — NITROFURANTOIN MONOHYD MACRO 100 MG PO CAPS: 100.0000 mg | ORAL_CAPSULE | Freq: Two times a day (BID) | ORAL | 0 refills | Status: DC

## 2019-06-10 MED ORDER — PHENAZOPYRIDINE HCL 200 MG PO TABS: 200.0000 mg | ORAL_TABLET | Freq: Three times a day (TID) | ORAL | 0 refills | Status: DC

## 2019-06-10 NOTE — ED Triage Notes (Signed)
Patient in today c/o urinary frequency, urgency, dysuria and flank pain x 2 days. Patient denies fever. Patient has taken OTC Tylenol for pain.

## 2019-06-10 NOTE — Discharge Instructions (Addendum)
It was very nice seeing you today in clinic. Thank you for entrusting me with your care.  ° °As discussed, your urine is POSITIVE for infection. Will approach treatment as follows: ° °Prescription has been sent to your pharmacy for antibiotics.  °Please pick up and take as directed. FINISH the entire course of medication even if you are feeling better.  °A culture will be sent on your provided sample. If it comes back resistant to what I have prescribed you, someone will call you and let you know that we will need to change antibiotics. °Increase fluid intake as much as possible to flush your urinary tract.  °Water is always the best.  °Avoid caffeine until your infection clears up, as it can contribute to painful bladder spasms.  °May use Tylenol and/or Ibuprofen as needed for pain/fever. ° °Make arrangements to follow up with your regular doctor in 1 week for re-evaluation. If your symptoms/condition worsens, please seek follow up care either here or in the ER. Please remember, our [facility] providers are "right here with you" when you need us.  ° °Again, it was my pleasure to take care of you today. Thank you for choosing our clinic. I hope that you start to feel better quickly.  ° °Rodgers Likes, MSN, APRN, FNP-C, CEN °Advanced Practice Provider °[facility] ° °

## 2019-06-10 NOTE — ED Provider Notes (Signed)
[place], [place]   Name: Olivia Ramos DOB: 04/19/1955 MRN: [id number] CSN: [id number] PCP: Patient, No Pcp Per  Arrival date and time:  06/10/19 1212  Chief Complaint:  Dysuria and Flank Pain   NOTE: Prior to seeing the patient today, I have reviewed the triage nursing documentation and vital signs. Clinical staff has updated patient's PMH/PSHx, current medication list, and drug allergies/intolerances to ensure comprehensive history available to assist in medical decision making.   History:   HPI: Olivia Ramos is a 64 y.o. female who presents today with complaints of urinary symptoms that began with acute onset 3 days ago. She complains of dysuria, frequency, and urgency. She has not appreciated any gross hematuria, nor has she noticed her urine being malodorous. Patient denies any associated nausea, vomiting, fever, or chills. Patient is able to eat and drink normally. She has been drinking extra fluids in attempts to improve her symptoms. She has experienced pain in her lower back and RIGHT flank area. She denies abdominal pain. Patient advises that she does not have a past medical history that is significant for recurrent urinary tract infections.  She denies any vaginal pain, bleeding, or discharge. Despite her symptoms, patient has not taken any over the counter interventions to help improve/relieve her reported symptoms at home.   Past Medical History:  Diagnosis Date  . Arthritis   . Diabetes mellitus without complication ([place])   . Hypertension     Past Surgical History:  Procedure Laterality Date  . CESAREAN SECTION     x 3    Family History  Problem Relation Age of Onset  . Heart attack Mother   . Diabetes Mother   . Hypertension Father   . Cancer Sister        breast  . Heart attack Brother   . Stroke Sister   . Breast cancer Neg Hx     Social History   Tobacco Use  . Smoking status: Never Smoker  . Smokeless tobacco: Never Used  Substance Use Topics  .  Alcohol use: No    Alcohol/week: 0.0 standard drinks  . Drug use: No    Patient Active Problem List   Diagnosis Date Noted  . Mixed hyperlipidemia 08/03/2017  . Background diabetic retinopathy ([place]) 07/01/2017  . Carpal tunnel syndrome of left wrist 05/31/2017  . Onychomycosis 05/17/2017  . 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 Diabetic neuropathy ([place]) 08/21/2016  . Constipation 07/02/2016  . Asthma 07/02/2016  . GERD (gastroesophageal reflux disease) 02/18/2015  . History of colon polyps 02/18/2015  . Osteoarthritis of both feet 02/18/2015  . Hyperlipidemia 11/26/2014  . 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 Essential hypertension 11/23/2014  . Depression 11/23/2014  . Obesity (BMI 30.0-34.9) 11/23/2014    Home Medications:    Current Meds  Medication Sig  . albuterol (VENTOLIN HFA) 108 (90 Base) MCG/ACT inhaler Inhale 2 puffs into the lungs every 4 (four) hours as needed for wheezing or shortness of breath.  Marland Kitchen atorvastatin (LIPITOR) 80 MG tablet Take 1 tablet (80 mg total) by mouth daily.  . famotidine-calcium carbonate-magnesium hydroxide (PEPCID COMPLETE) 10-800-165 MG chewable tablet Chew 1 tablet by mouth daily as needed.  . Fluticasone-Salmeterol (ADVAIR) 250-50 MCG/DOSE AEPB Inhale 1 puff into the lungs 2 (two) times daily.  Marland Kitchen glucose blood (FREESTYLE LITE) test strip Use as instructed  . glucose monitoring kit (FREESTYLE) monitoring kit 1 each by Does not apply route daily. Check glucose once in the morning before breakfast and 1 hour  after a meal  . Insulin Glargine (BASAGLAR KWIKPEN) 100 UNIT/ML SOPN INJECT 0.35 MLS (35 UNITS TOTAL) INTO THE SKIN DAILY.  Marland Kitchen Insulin Pen Needle (PEN NEEDLES) 31G X 8 MM MISC Use as directed  . Lancets (FREESTYLE) lancets Use as instructed  . losartan (COZAAR) 50 MG tablet Take 1 tablet (50 mg total) by mouth daily.  . metFORMIN (GLUCOPHAGE) 1000 MG tablet TAKE 1 TABLET (1,000 MG TOTAL) BY MOUTH 2 (TWO) TIMES DAILY  WITH A MEAL.    Allergies:   Soy allergy  Review of Systems (ROS):  Review of systems NEGATIVE unless otherwise noted in narrative H&P section.   Vital Signs: Today's Vitals   06/10/19 1227 06/10/19 1228 06/10/19 1229 06/10/19 1315  BP:  (!) 177/97    Pulse:  93    Resp:  18    Temp:  98.4 F (36.9 C)    TempSrc:  Oral    SpO2:  99%    Weight:   174 lb (78.9 kg)   Height:   5' (1.524 m)   PainSc: 10-Worst pain ever   10-Worst pain ever    Physical Exam: Physical Exam  Constitutional: She is oriented to person, place, and time and well-developed, well-nourished, and in no distress.  HENT:  Head: Normocephalic and atraumatic.  Eyes: Pupils are equal, round, and reactive to light.  Cardiovascular: Normal rate, regular rhythm, normal heart sounds and intact distal pulses.  Pulmonary/Chest: Effort normal and breath sounds normal.  Abdominal: Soft. Normal appearance and bowel sounds are normal. She exhibits no distension. There is no abdominal tenderness. There is CVA tenderness (mild on RIGHT).  Neurological: She is alert and oriented to person, place, and time. Gait normal.  Skin: Skin is warm and dry. No rash noted. She is not diaphoretic.  Psychiatric: Mood, memory, affect and judgment normal.  Nursing note and vitals reviewed.  Urgent Care Treatments / Results:   Orders Placed This Encounter  Procedures  . Urine culture  . Urinalysis, Complete w Microscopic    LABS: PLEASE NOTE: all labs that were ordered this encounter are listed, however only abnormal results are displayed. Labs Reviewed  URINALYSIS, COMPLETE (UACMP) WITH MICROSCOPIC - Abnormal; Notable for the following components:      Result Value   APPearance CLOUDY (*)    Hgb urine dipstick MODERATE (*)    Protein, ur 100 (*)    Leukocytes,Ua SMALL (*)    Bacteria, UA MANY (*)    All other components within normal limits  URINE CULTURE    EKG: -None  RADIOLOGY: No results  found.  PROCEDURES: Procedures  MEDICATIONS RECEIVED THIS VISIT: Medications - No data to display  PERTINENT CLINICAL COURSE NOTES/UPDATES:   Initial Impression / Assessment and Plan / Urgent Care Course:  Pertinent labs & imaging results that were available during my care of the patient were personally reviewed by me and considered in my medical decision making (see lab/imaging section of note for values and interpretations).  Olivia Ramos is a 64 y.o. female who presents to [facility] today with complaints of Dysuria and Flank Pain  Patient is well appearing overall in clinic today. She does not appear to be in any acute distress. Presenting symptoms (see HPI) and exam as documented above. UA was (+) for infection; reflex culture sent. Will treat with a 5 day course of nitrofurantoin. Patient encouraged to complete the entire course of antibiotics even if she begins to feel better. She was advised that if  culture demonstrates resistance to the prescribed antibiotic, she will be contacted and advised of the need to change the antibiotic being used to treat her infection. Patient encouraged to increase her fluid intake as much as possible. Discussed that water is always best to flush the urinary tract. She was advised to avoid caffeine containing fluids until her infections clears, as caffeine can cause her to experience painful bladder spasms. May use Tylenol and/or Ibuprofen as needed for pain/fever. Will also send a prescription for a short course of PRN phenazopyridine to help relieve her current urinary pain.   Discussed follow up with primary care physician in 1 week for re-evaluation. I have reviewed the follow up and strict return precautions for any new or worsening symptoms. Patient is aware of symptoms that would be deemed urgent/emergent, and would thus require further evaluation either here or in the emergency department. At the time of discharge, she verbalized understanding  and consent with the discharge plan as it was reviewed with her. All questions were fielded by provider and/or clinic staff prior to patient discharge.    Final Clinical Impressions / Urgent Care Diagnoses:   Final diagnoses:  Urinary tract infection without hematuria, site unspecified    New Prescriptions:  [place] Controlled Substance Registry consulted? Not Applicable  Meds ordered this encounter  Medications  . phenazopyridine (PYRIDIUM) 200 MG tablet    Sig: Take 1 tablet (200 mg total) by mouth 3 (three) times daily.    Dispense:  6 tablet    Refill:  0  . nitrofurantoin, macrocrystal-monohydrate, (MACROBID) 100 MG capsule    Sig: Take 1 capsule (100 mg total) by mouth 2 (two) times daily.    Dispense:  10 capsule    Refill:  0    Recommended Follow up Care:  Patient encouraged to follow up with the following provider within the specified time frame, or sooner as dictated by the severity of her symptoms. As always, she was instructed that for any urgent/emergent care needs, she should seek care either here or in the emergency department for more immediate evaluation.  Follow-up Information    PCP In 1 week.   Why: General reassessment of symptoms if not improving        NOTE: This note was prepared using Lobbyist along with smaller Company secretary. Despite my best ability to proofread, there is the potential that transcriptional errors may still occur from this process, and are completely unintentional.    Karen Kitchens, NP 06/10/19 1327

## 2019-06-13 LAB — URINE CULTURE: Culture: >=100000 — AB

## 2019-11-14 ENCOUNTER — Ambulatory Visit
Admission: EM | Admit: 2019-11-14 | Discharge: 2019-11-14 | Disposition: A | Discharge status: Home / Self Care | Payer: BC Managed Care – PPO | Attending: Internal Medicine | Admitting: Internal Medicine

## 2019-11-14 ENCOUNTER — Other Ambulatory Visit: Payer: Self-pay

## 2019-11-14 ENCOUNTER — Encounter: Payer: Self-pay | Admitting: Emergency Medicine

## 2019-11-14 ENCOUNTER — Ambulatory Visit (INDEPENDENT_AMBULATORY_CARE_PROVIDER_SITE_OTHER): Payer: BC Managed Care – PPO

## 2019-11-14 DIAGNOSIS — I10 Essential (primary) hypertension: Secondary | ICD-10-CM | POA: Insufficient documentation

## 2019-11-14 DIAGNOSIS — E11319 Type 2 diabetes mellitus with unspecified diabetic retinopathy without macular edema: Secondary | ICD-10-CM | POA: Insufficient documentation

## 2019-11-14 DIAGNOSIS — J4521 Mild intermittent asthma with (acute) exacerbation: Secondary | ICD-10-CM | POA: Diagnosis not present

## 2019-11-14 DIAGNOSIS — Z794 Long term (current) use of insulin: Secondary | ICD-10-CM | POA: Insufficient documentation

## 2019-11-14 DIAGNOSIS — Z7951 Long term (current) use of inhaled steroids: Secondary | ICD-10-CM | POA: Diagnosis not present

## 2019-11-14 DIAGNOSIS — E782 Mixed hyperlipidemia: Secondary | ICD-10-CM | POA: Insufficient documentation

## 2019-11-14 DIAGNOSIS — R0602 Shortness of breath: Secondary | ICD-10-CM | POA: Insufficient documentation

## 2019-11-14 DIAGNOSIS — R05 Cough: Secondary | ICD-10-CM | POA: Diagnosis present

## 2019-11-14 DIAGNOSIS — K219 Gastro-esophageal reflux disease without esophagitis: Secondary | ICD-10-CM | POA: Diagnosis not present

## 2019-11-14 DIAGNOSIS — J4530 Mild persistent asthma, uncomplicated: Secondary | ICD-10-CM

## 2019-11-14 DIAGNOSIS — Z6833 Body mass index (BMI) 33.0-33.9, adult: Secondary | ICD-10-CM | POA: Diagnosis not present

## 2019-11-14 DIAGNOSIS — E669 Obesity, unspecified: Secondary | ICD-10-CM | POA: Insufficient documentation

## 2019-11-14 DIAGNOSIS — Z20822 Contact with and (suspected) exposure to covid-19: Secondary | ICD-10-CM | POA: Diagnosis not present

## 2019-11-14 DIAGNOSIS — Z79899 Other long term (current) drug therapy: Secondary | ICD-10-CM | POA: Insufficient documentation

## 2019-11-14 HISTORY — DX: Unspecified asthma, uncomplicated: J45.909

## 2019-11-14 MED ORDER — PREDNISONE 20 MG PO TABS: 20.0000 mg | ORAL_TABLET | Freq: Every day | ORAL | 0 refills | Status: AC

## 2019-11-14 MED ORDER — BENZONATATE 100 MG PO CAPS: 100.0000 mg | ORAL_CAPSULE | Freq: Three times a day (TID) | ORAL | 0 refills | Status: DC

## 2019-11-14 MED ORDER — ALBUTEROL SULFATE HFA 108 (90 BASE) MCG/ACT IN AERS: 2.0000 | INHALATION_SPRAY | RESPIRATORY_TRACT | 0 refills | Status: DC | PRN

## 2019-11-14 NOTE — Discharge Instructions (Addendum)
Use inhaler as recommended If you develop any worsening shortness of breath, cough, purulent sputum, fever, chills or extreme fatigue please return to urgent care to be reevaluated. Please quarantine until COVID-19 test results are available.

## 2019-11-14 NOTE — ED Triage Notes (Signed)
Patient in today c/o cough and sob x 2 days. Patient denies fever. Patient took OTC Catering manager Plus last night. Patient completed J&J covid vaccine in 06/2019.

## 2019-11-15 LAB — SARS CORONAVIRUS 2 (TAT 6-24 HRS): SARS Coronavirus 2: NEGATIVE

## 2019-11-15 NOTE — ED Provider Notes (Signed)
[facility]    CSN: [id number] Arrival date & time: 11/14/19  1413      History   Chief Complaint Chief Complaint  Patient presents with  . Cough  . Shortness of Breath    HPI Olivia Ramos is a 64 y.o. female comes to the urgent care today with 2-day history of shortness of breath, cough and wheezing.  Patient's symptoms started a couple of days ago and has been persistent.  Patient has shortness of breath, wheezing and a cough productive of clear sputum.  She has a history of asthma but is not sure what type of inhaler she has at home.  She has tried over-the-counter Alka-Seltzer with no improvement.  She denies any chest pain or chest tightness.  No dizziness.  No fever or chills.  Patient has been fully vaccinated against COVID-19 virus.  HPI  Past Medical History:  Diagnosis Date  . Arthritis   . Asthma   . Diabetes mellitus without complication ([place])   . Hypertension     Patient Active Problem List   Diagnosis Date Noted  . Mixed hyperlipidemia 08/03/2017  . Background diabetic retinopathy ([place]) 07/01/2017  . Carpal tunnel syndrome of left wrist 05/31/2017  . Onychomycosis 05/17/2017  . 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 Diabetic neuropathy ([place]) 08/21/2016  . Constipation 07/02/2016  . Asthma 07/02/2016  . GERD (gastroesophageal reflux disease) 02/18/2015  . History of colon polyps 02/18/2015  . Osteoarthritis of both feet 02/18/2015  . Hyperlipidemia 11/26/2014  . 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 Essential hypertension 11/23/2014  . Depression 11/23/2014  . Obesity (BMI 30.0-34.9) 11/23/2014    Past Surgical History:  Procedure Laterality Date  . CESAREAN SECTION     x 3    OB History   No obstetric history on file.      Home Medications    Prior to Admission medications   Medication Sig Start Date End Date Taking? Authorizing Provider  atorvastatin (LIPITOR) 80 MG tablet Take 1 tablet (80 mg total) by  mouth daily. 04/04/18  Yes Juline Patch, MD  famotidine-calcium carbonate-magnesium hydroxide (PEPCID COMPLETE) [phone] MG chewable tablet Chew 1 tablet by mouth daily as needed.   Yes [provider]  Fluticasone-Salmeterol (ADVAIR) 250-50 MCG/DOSE AEPB Inhale 1 puff into the lungs 2 (two) times daily. 04/04/18  Yes Juline Patch, MD  Insulin Glargine (BASAGLAR KWIKPEN) 100 UNIT/ML SOPN INJECT 0.35 MLS (35 UNITS TOTAL) INTO THE SKIN DAILY. 09/20/18  Yes Juline Patch, MD  losartan (COZAAR) 50 MG tablet Take 1 tablet (50 mg total) by mouth daily. 04/04/18  Yes Juline Patch, MD  metFORMIN (GLUCOPHAGE) 1000 MG tablet TAKE 1 TABLET (1,000 MG TOTAL) BY MOUTH 2 (TWO) TIMES DAILY WITH A MEAL. 10/03/18  Yes Juline Patch, MD  albuterol (VENTOLIN HFA) 108 (90 Base) MCG/ACT inhaler Inhale 2 puffs into the lungs every 4 (four) hours as needed for wheezing or shortness of breath. 11/14/19   Nateisha Moyd, Myrene Galas, MD  benzonatate (TESSALON) 100 MG capsule Take 1 capsule (100 mg total) by mouth every 8 (eight) hours. 11/14/19   Chase Picket, MD  glucose blood (FREESTYLE LITE) test strip Use as instructed 04/04/18   Juline Patch, MD  glucose monitoring kit (FREESTYLE) monitoring kit 1 each by Does not apply route daily. Check glucose once in the morning before breakfast and 1 hour after a meal 04/04/18   Juline Patch, MD  Insulin Pen Needle (PEN NEEDLES)  31G X 8 MM MISC Use as directed 04/04/18   Juline Patch, MD  Lancets (FREESTYLE) lancets Use as instructed 04/04/18   Juline Patch, MD  predniSONE (DELTASONE) 20 MG tablet Take 1 tablet (20 mg total) by mouth daily for 5 days. 11/14/19 11/19/19  Chase Picket, MD    Family History Family History  Problem Relation Age of Onset  . Heart attack Mother   . Diabetes Mother   . Hypertension Father   . Cancer Sister        breast  . Heart attack Brother   . Stroke Sister   . Breast cancer Neg Hx     Social History Social  History   Tobacco Use  . Smoking status: Never Smoker  . Smokeless tobacco: Never Used  Vaping Use  . Vaping Use: Never used  Substance Use Topics  . Alcohol use: No    Alcohol/week: 0.0 standard drinks  . Drug use: No     Allergies   Soy allergy   Review of Systems Review of Systems  HENT: Negative.   Respiratory: Positive for cough, shortness of breath and wheezing.   Cardiovascular: Negative for chest pain and palpitations.  Gastrointestinal: Negative.  Negative for abdominal pain, nausea and vomiting.  Skin: Negative.   Neurological: Negative for dizziness, numbness and headaches.     Physical Exam Triage Vital Signs ED Triage Vitals  Enc Vitals Group     BP 11/14/19 1526 (!) 195/98     Pulse Rate 11/14/19 1526 (!) 110     Resp 11/14/19 1526 18     Temp 11/14/19 1526 98.1 F (36.7 C)     Temp Source 11/14/19 1526 Oral     SpO2 11/14/19 1526 99 %     Weight 11/14/19 1523 175 lb (79.4 kg)     Height 11/14/19 1523 5' 1"  (1.549 m)     Head Circumference --      Peak Flow --      Pain Score 11/14/19 1523 0     Pain Loc --      Pain Edu? --      Excl. in [place]? --    No data found.  Updated Vital Signs BP (!) 156/70 (BP Location: Left Arm)   Pulse (!) 110   Temp 98.1 F (36.7 C) (Oral)   Resp 18   Ht 5' 1"  (1.549 m)   Wt 79.4 kg   SpO2 99%   BMI 33.07 kg/m   Visual Acuity Right Eye Distance:   Left Eye Distance:   Bilateral Distance:    Right Eye Near:   Left Eye Near:    Bilateral Near:     Physical Exam Vitals and nursing note reviewed.  Constitutional:      General: She is not in acute distress.    Appearance: She is not ill-appearing.  Pulmonary:     Effort: Pulmonary effort is normal. No tachypnea or bradypnea.     Breath sounds: Examination of the right-lower field reveals decreased breath sounds and wheezing. Examination of the left-lower field reveals decreased breath sounds and wheezing. Decreased breath sounds and wheezing present.  No rhonchi or rales.  Abdominal:     General: Bowel sounds are normal.     Palpations: Abdomen is soft.  Neurological:     Mental Status: She is alert.      UC Treatments / Results  Labs (all labs ordered are listed, but only abnormal results are displayed) Labs  Reviewed  SARS CORONAVIRUS 2 (TAT 6-24 HRS)    EKG   Radiology DG Chest 2 View  Result Date: 11/14/2019 CLINICAL DATA:  Cough and shortness of breath. EXAM: CHEST - 2 VIEW COMPARISON:  Chest x-ray dated January 21, 2018. FINDINGS: The heart size and mediastinal contours are within normal limits. Both lungs are clear. The visualized skeletal structures are unremarkable. IMPRESSION: No active cardiopulmonary disease. Electronically Signed   By: Titus Dubin M.D.   On: 11/14/2019 16:20    Procedures Procedures (including critical care time)  Medications Ordered in UC Medications - No data to display  Initial Impression / Assessment and Plan / UC Course  I have reviewed the triage vital signs and the nursing notes.  Pertinent labs & imaging results that were available during my care of the patient were reviewed by me and considered in my medical decision making (see chart for details).     1.  Mild intermittent asthma with acute exacerbation: COVID-19 PCR test sent Prednisone 20 mg orally daily for 5 days Albuterol inhaler-2 puffs every 4-6 hours as needed for shortness of breath Tessalon Perles as needed for cough If patient symptom worsens she is advised to return to the urgent care to be reevaluated Chest x-ray is negative for any lung infiltrate. Patient verbalizes understanding of the plan. Final Clinical Impressions(s) / UC Diagnoses   Final diagnoses:  Mild intermittent asthma with acute exacerbation     Discharge Instructions     Use inhaler as recommended If you develop any worsening shortness of breath, cough, purulent sputum, fever, chills or extreme fatigue please return to urgent care to be  reevaluated. Please quarantine until COVID-19 test results are available.   ED Prescriptions    Medication Sig Dispense Auth. Provider   albuterol (VENTOLIN HFA) 108 (90 Base) MCG/ACT inhaler Inhale 2 puffs into the lungs every 4 (four) hours as needed for wheezing or shortness of breath. 18 g Mana Morison, Myrene Galas, MD   predniSONE (DELTASONE) 20 MG tablet Take 1 tablet (20 mg total) by mouth daily for 5 days. 5 tablet Macie Baum, Myrene Galas, MD   benzonatate (TESSALON) 100 MG capsule Take 1 capsule (100 mg total) by mouth every 8 (eight) hours. 21 capsule Timoty Bourke, Myrene Galas, MD     PDMP not reviewed this encounter.   Chase Picket, MD 11/15/19 [phone]

## 2021-10-11 ENCOUNTER — Ambulatory Visit (INDEPENDENT_AMBULATORY_CARE_PROVIDER_SITE_OTHER): Payer: BC Managed Care – PPO

## 2021-10-11 ENCOUNTER — Ambulatory Visit
Admission: EM | Admit: 2021-10-11 | Discharge: 2021-10-11 | Disposition: A | Discharge status: Home / Self Care | Payer: BC Managed Care – PPO | Attending: Physician Assistant | Admitting: Physician Assistant

## 2021-10-11 DIAGNOSIS — M79645 Pain in left finger(s): Secondary | ICD-10-CM | POA: Diagnosis not present

## 2021-10-11 DIAGNOSIS — L03012 Cellulitis of left finger: Secondary | ICD-10-CM

## 2021-10-11 MED ORDER — CEPHALEXIN 500 MG PO CAPS
500.0000 mg | ORAL_CAPSULE | Freq: Four times a day (QID) | ORAL | 0 refills | Status: AC
Start: 2021-10-11 — End: 2021-10-21

## 2021-10-11 NOTE — Discharge Instructions (Signed)
-  I sent antibiotics to the pharmacy for you.  The x-ray does not show any penetration to the bone.  You do have an infection.  I placed a referral to wound care for you.  Since you have had this scabbed over wound for the past 3 months and it has not healed I want you to follow-up with wound care.  You are diabetic and that makes it harder for things to heal sometimes.

## 2021-10-11 NOTE — ED Triage Notes (Signed)
Pt c/o pain on left ring finger.  Pt broke her fingernail in April and pulled off a piece of the nail. Pt has had pain since April.

## 2021-10-11 NOTE — ED Provider Notes (Signed)
[facility]    CSN: [id number] Arrival date & time: 10/11/21  1305      History   Chief Complaint Chief Complaint  Patient presents with   Finger Injury    HPI Olivia Ramos is a 66 y.o. female presenting for a wound of the left ring finger that has been present for the past 3 months.  Patient reports she broke her fingernail (denies injury) and pull a few weeks of the nail.  Reports she developed a sore and it has persisted and she has redness and swelling of the distal finger as well.  No drainage from the area.  Has been using OTC meds to treat without improvement.  Denies fever.  Reports that finger is painful.  She does have a history of diabetes and hypertension.    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      Home Medications    Prior to Admission medications   Medication Sig Start Date End Date Taking? Authorizing Provider  cephALEXin (KEFLEX) 500 MG capsule Take 1 capsule (500 mg total) by mouth 4 (four) times daily for 10 days. 10/11/21 10/21/21 Yes Danton Clap, PA-C  albuterol  (VENTOLIN HFA) 108 (90 Base) MCG/ACT inhaler Inhale 2 puffs into the lungs every 4 (four) hours as needed for wheezing or shortness of breath. 11/14/19   Chase Picket, MD  atorvastatin (LIPITOR) 80 MG tablet Take 1 tablet (80 mg total) by mouth daily. 04/04/18   Juline Patch, MD  benzonatate (TESSALON) 100 MG capsule Take 1 capsule (100 mg total) by mouth every 8 (eight) hours. 11/14/19   Chase Picket, MD  famotidine-calcium carbonate-magnesium hydroxide (PEPCID COMPLETE) 10-800-165 MG chewable tablet Chew 1 tablet by mouth daily as needed.    [provider]  Fluticasone-Salmeterol (ADVAIR) 250-50 MCG/DOSE AEPB Inhale 1 puff into the lungs 2 (two) times daily. 04/04/18   Juline Patch, MD  glucose blood (FREESTYLE LITE) test strip Use as instructed 04/04/18   Juline Patch, MD  glucose monitoring kit (FREESTYLE) monitoring kit 1 each by Does not apply route daily. Check glucose once in the morning before breakfast and 1 hour after a meal 04/04/18   Juline Patch, MD  Insulin Glargine (BASAGLAR KWIKPEN) 100 UNIT/ML SOPN INJECT 0.35 MLS (35 UNITS TOTAL) INTO THE SKIN DAILY. 09/20/18   Juline Patch, MD  Insulin Pen Needle (PEN NEEDLES) 31G X 8 MM MISC Use as directed 04/04/18   Juline Patch, MD  Lancets (FREESTYLE) lancets Use as instructed 04/04/18   Juline Patch, MD  losartan (COZAAR) 50 MG tablet Take 1 tablet (50 mg total) by mouth daily. 04/04/18   Juline Patch, MD  metFORMIN (GLUCOPHAGE) 1000 MG tablet TAKE 1 TABLET (1,000 MG TOTAL) BY MOUTH 2 (TWO) TIMES DAILY WITH A MEAL. 10/03/18   Juline Patch, MD    Family History Family History  Problem Relation Age of Onset   Heart attack Mother    Diabetes Mother    Hypertension Father    Cancer Sister        breast   Heart attack Brother    Stroke Sister    Breast cancer Neg Hx     Social History Social History   Tobacco Use   Smoking status: Never   Smokeless tobacco: Never  Vaping Use   Vaping  Use: Never used  Substance Use Topics   Alcohol use: No    Alcohol/week: 0.0 standard drinks of alcohol   Drug use: No     Allergies   Soy allergy   Review of Systems Review of Systems  Constitutional:  Negative for fever.  Musculoskeletal:  Positive for arthralgias and joint swelling.  Skin:  Positive for color change and wound.  Neurological:  Negative for weakness and numbness.     Physical Exam Triage Vital Signs ED Triage Vitals  Enc Vitals Group     BP 10/11/21 1319 (!) 179/104     Pulse Rate 10/11/21 1319 97     Resp 10/11/21 1319 18     Temp 10/11/21 1319 98.5 F (36.9 C)     Temp Source 10/11/21 1319 Oral     SpO2 10/11/21 1319 97 %     Weight 10/11/21 1317 156 lb (70.8 kg)     Height 10/11/21 1317 _0  (1.499 m)     Head Circumference --      Peak Flow --      Pain Score 10/11/21 1317 10     Pain Loc --      Pain Edu? --      Excl. in [place]? --    No data found.  Updated Vital Signs BP (!) 179/104 (BP Location: Left Arm)   Pulse 97   Temp 98.5 F (36.9 C) (Oral)   Resp 18   Ht _1  (1.499 m)   Wt 156 lb (70.8 kg)   SpO2 97%   BMI 31.51 kg/m     Physical Exam Vitals and nursing note reviewed.  Constitutional:      General: She is not in acute distress.    Appearance: Normal appearance. She is not ill-appearing or toxic-appearing.  HENT:     Head: Normocephalic and atraumatic.  Eyes:     General: No scleral icterus.       Right eye: No discharge.        Left eye: No discharge.     Conjunctiva/sclera: Conjunctivae normal.  Cardiovascular:     Rate and Rhythm: Normal rate and regular rhythm.     Pulses: Normal pulses.  Pulmonary:     Effort: Pulmonary effort is normal. No respiratory distress.  Musculoskeletal:     Cervical back: Neck supple.     Comments: Left fourth finger: There is erythema and swelling of the distal finger with a scabbed over lesion of the distal finger near the nail bed.  Area is very tender to palpation.  No  bleeding or drainage from the wound.  Skin:    General: Skin is dry.  Neurological:  General: No focal deficit present.     Mental Status: She is alert. Mental status is at baseline.     Motor: No weakness.     Gait: Gait normal.  Psychiatric:        Mood and Affect: Mood normal.        Behavior: Behavior normal.        Thought Content: Thought content normal.       UC Treatments / Results  Labs (all labs ordered are listed, but only abnormal results are displayed) Labs Reviewed - No data to display  EKG   Radiology DG Finger Ring Left  Result Date: 10/11/2021 CLINICAL DATA:  Soreness and redness to distal finger. EXAM: LEFT RING FINGER 2+V COMPARISON:  None Available. FINDINGS: No evidence for an acute fracture or dislocation. Advanced degenerative changes noted D IP joint. No bony lysis or destruction to suggest osteomyelitis. No radiopaque soft tissue foreign body. IMPRESSION: No acute findings Electronically Signed   By: Misty Stanley M.D.   On: 10/11/2021 14:19    Procedures Procedures (including critical care time)  Medications Ordered in UC Medications - No data to display  Initial Impression / Assessment and Plan / UC Course  I have reviewed the triage vital signs and the nursing notes.  Pertinent labs & imaging results that were available during my care of the patient were reviewed by me and considered in my medical decision making (see chart for details).  66 year old diabetic and hypertensive female presents for lesion of the left ring finger for the past 3 months that has not completely healed and is now erythematous, swollen and painful.  X-ray obtained today to assess for possible osteomyelitis given how long she has had this lesion.  X-ray does not show any evidence of osteomyelitis but patient does have advanced degenerative changes in this DIP joint.  Discussed the results with her.  Suspect cellulitis and poorly healing wound.  We will treat at this  time with Keflex.  Placed referral to wound care for patient to see if she needs to have this debrided.  ER precautions given.   Final Clinical Impressions(s) / UC Diagnoses   Final diagnoses:  Cellulitis of finger of left hand     Discharge Instructions      -I sent antibiotics to the pharmacy for you.  The x-ray does not show any penetration to the bone.  You do have an infection.  I placed a referral to wound care for you.  Since you have had this scabbed over wound for the past 3 months and it has not healed I want you to follow-up with wound care.  You are diabetic and that makes it harder for things to heal sometimes.     ED Prescriptions     Medication Sig Dispense Auth. Provider   cephALEXin (KEFLEX) 500 MG capsule Take 1 capsule (500 mg total) by mouth 4 (four) times daily for 10 days. 40 capsule Danton Clap, PA-C      PDMP not reviewed this encounter.   Danton Clap, PA-C 10/11/21 1431

## 2021-10-17 ENCOUNTER — Telehealth: Payer: BC Managed Care – PPO | Admitting: Physician Assistant

## 2021-10-17 DIAGNOSIS — L03019 Cellulitis of unspecified finger: Secondary | ICD-10-CM

## 2021-10-17 NOTE — Progress Notes (Signed)
Based on what you shared with me, I feel your condition warrants further evaluation as soon as possible at an Emergency department.  If symptoms are worsening and significant/severe pain despite treatment with antibiotic, there is concern for a more complicated infection that we do not want to go deeper into the bone. This warrants and in-person evaluation ASAP.    NOTE: There will be NO CHARGE for this eVisit   If you are having a true medical emergency please call 911.      Emergency Department-[facility]  Get Driving Directions  [phone]  [address]  [place], [place] [address]  Open 24/7/365      [facility] Emergency Department at [facility]  Get Driving Directions  [address]  [place], [place] [address]  Open 24/7/365    Emergency Department- [facility]  Get Driving Directions  [phone]  [address]  [place], [place] [address]  Open 24/7/365      [facility]  Get Driving Directions  [phone]  [address]  [place], [place] [address]  Open 24/7/365    [facility]  Emergency Department- [facility]  Get Driving Directions  [phone]  [address]  [place], [place] [address]  Open 24/7/365    [place]  Emergency Department- [facility]  Get Driving Directions  [address]  [place], [place] [address]  Open 24/7/365    [facility]  Emergency Department- [facility]  Get Driving Directions  [phone]  [address]  [place], [place] [address]  Open 24/7/365

## 2021-10-24 ENCOUNTER — Encounter: Payer: BC Managed Care – PPO | Attending: Physician Assistant | Admitting: Physician Assistant

## 2021-10-24 ENCOUNTER — Other Ambulatory Visit
Admission: RE | Admit: 2021-10-24 | Discharge: 2021-10-24 | Disposition: A | Discharge status: Home / Self Care | Payer: BC Managed Care – PPO | Attending: Physician Assistant | Admitting: Physician Assistant

## 2021-10-24 DIAGNOSIS — I10 Essential (primary) hypertension: Secondary | ICD-10-CM | POA: Diagnosis not present

## 2021-10-24 DIAGNOSIS — E11622 Type 2 diabetes mellitus with other skin ulcer: Secondary | ICD-10-CM | POA: Insufficient documentation

## 2021-10-24 DIAGNOSIS — L98492 Non-pressure chronic ulcer of skin of other sites with fat layer exposed: Secondary | ICD-10-CM | POA: Diagnosis not present

## 2021-10-24 DIAGNOSIS — B999 Unspecified infectious disease: Secondary | ICD-10-CM | POA: Diagnosis present

## 2021-10-24 DIAGNOSIS — L03012 Cellulitis of left finger: Secondary | ICD-10-CM | POA: Diagnosis present

## 2021-10-28 NOTE — Progress Notes (Signed)
WANNA, GULLY ([id number]) Visit Report for 10/24/2021 Chief Complaint Document Details Patient Name: Olivia Ramos, Olivia Ramos Date of Service: 10/24/2021 8:45 AM Medical Record Number: [id number] Patient Account Number: [id number] Date of Birth/Sex: September 22, 1955 (65 y.o. F) Treating RN: Yevonne Pax Primary Care Provider: Jenell Milliner Other Clinician: Referring Provider: Eusebio Friendly Treating Provider/Extender: Rowan Blase in Treatment: 0 Information Obtained from: Patient Chief Complaint Left 3rd finger ulcer Electronic Signature(s) Signed: 10/24/2021 9:38:35 AM By: Lenda Kelp PA-C Entered By: Lenda Kelp on 10/24/2021 09:38:35 [place], Olivia Ramos ([id number]) -------------------------------------------------------------------------------- Debridement Details Patient Name: Olivia Ramos Date of Service: 10/24/2021 8:45 AM Medical Record Number: [id number] Patient Account Number: [id number] Date of Birth/Sex: Dec 12, 1955 (65 y.o. F) Treating RN: Yevonne Pax Primary Care Provider: Jenell Milliner Other Clinician: Referring Provider: Eusebio Friendly Treating Provider/Extender: Rowan Blase in Treatment: 0 Debridement Performed for Wound #1 Left,Distal Hand - 3rd Digit Assessment: Performed By: Physician Nelida Meuse., PA-C Debridement Type: Debridement Level of Consciousness (Pre- Awake and Alert procedure): Pre-procedure Verification/Time Out Yes - 09:45 Taken: Start Time: 09:45 Pain Control: Lidocaine Injectable : 2 Total Area Debrided (L x W): 0.4 (cm) x 0.5 (cm) = 0.2 (cm) Tissue and other material Viable, Non-Viable, Eschar, Slough, Subcutaneous, Skin: Dermis , Skin: Epidermis, Slough debrided: Level: Skin/Subcutaneous Tissue Debridement Description: Excisional Instrument: Curette Bleeding: Moderate Hemostasis Achieved: Pressure End Time: 09:55 Procedural Pain: 0 Post Procedural Pain: 0 Response to Treatment: Procedure was tolerated well Level of  Consciousness (Post- Awake and Alert procedure): Post Debridement Measurements of Total Wound Length: (cm) 0.4 Width: (cm) 0.5 Depth: (cm) 0.1 Volume: (cm) 0.016 Character of Wound/Ulcer Post Debridement: Improved Post Procedure Diagnosis Same as Pre-procedure Electronic Signature(s) Signed: 10/24/2021 4:22:23 PM By: Lenda Kelp PA-C Signed: 10/28/2021 3:54:37 PM By: Yevonne Pax RN Entered By: Yevonne Pax on 10/24/2021 09:49:58 Olivia Ramos, Olivia Ramos ([id number]) -------------------------------------------------------------------------------- HPI Details Patient Name: Olivia Ramos Date of Service: 10/24/2021 8:45 AM Medical Record Number: [id number] Patient Account Number: [id number] Date of Birth/Sex: May 21, 1955 (65 y.o. F) Treating RN: Yevonne Pax Primary Care Provider: Jenell Milliner Other Clinician: Referring Provider: Eusebio Friendly Treating Provider/Extender: Rowan Blase in Treatment: 0 History of Present Illness HPI Description: 10-24-2021 upon evaluation today patient appears to be doing somewhat poorly in regard to a wound on her finger. This occurred after she was pulling on a hangnail. Subsequently this is something that seem to come up all of a sudden and has been extremely painful for her. Fortunately there does not appear to be any signs of active systemically though locally that is a different story. There is actually some eschar noted and it is extremely painful to touch she has been taking meloxicam which was helping to some degree. With that being said she has been on Keflex but that did not seem to help at all. The patient was referred to our office for further evaluation and treatment. She does have a history of diabetes mellitus type 2 with hemoglobin A1c of 6.3 on 09-15-2021. Electronic Signature(s) Signed: 10/24/2021 10:08:30 AM By: Lenda Kelp PA-C Entered By: Lenda Kelp on 10/24/2021 10:08:30 Olivia Ramos, Olivia Ramos  ([id number]) -------------------------------------------------------------------------------- Physical Exam Details Patient Name: Olivia Ramos Date of Service: 10/24/2021 8:45 AM Medical Record Number: [id number] Patient Account Number: [id number] Date of Birth/Sex: Aug 15, 1955 (65 y.o. F) Treating RN: Yevonne Pax Primary Care Provider: Jenell Milliner Other Clinician: Referring Provider: Eusebio Friendly Treating Provider/Extender: Rowan Blase in Treatment: 0 Constitutional sitting or standing blood pressure is within target range for patient.. pulse regular and  within target range for patient.Marland Kitchen respirations regular, non- labored and within target range for patient.Marland Kitchen temperature within target range for patient.. Well-nourished and well-hydrated in no acute distress. Eyes conjunctiva clear no eyelid edema noted. pupils equal round and reactive to light and accommodation. Ears, Nose, Mouth, and Throat no gross abnormality of ear auricles or external auditory canals. normal hearing noted during conversation. mucus membranes moist. Respiratory normal breathing without difficulty. Cardiovascular 2+ dorsalis pedis/posterior tibialis pulses. no clubbing, cyanosis, significant edema, <3 sec cap refill. Musculoskeletal normal gait and posture. no significant deformity or arthritic changes, no loss or range of motion, no clubbing. Psychiatric this patient is able to make decisions and demonstrates good insight into disease process. Alert and Oriented x 3. pleasant and cooperative. Notes Upon inspection patient's wound actually appears to be likely a paronychia that has worsened given time and unfortunately there is some area of eschar here. The cellulitis seems to involve the tip of the finger in general but does not appear to be too deep. Fortunately I do not see any evidence of active infection locally or systemically at this time. I was able to perform debridement only after numbing her I  did use 5 cc of 2% lidocaine into the finger which the patient then was completely numb and was able to allow me to debride the wound without any complication whatsoever. Postdebridement the wound bed appears to be doing much better which is great news. Electronic Signature(s) Signed: 10/24/2021 10:09:47 AM By: Lenda Kelp PA-C Entered By: Lenda Kelp on 10/24/2021 10:09:47 Olivia Ramos, Olivia Ramos ([id number]) -------------------------------------------------------------------------------- Physician Orders Details Patient Name: Olivia Ramos Date of Service: 10/24/2021 8:45 AM Medical Record Number: [id number] Patient Account Number: [id number] Date of Birth/Sex: [birth year] (65 y.o. F) Treating RN: Yevonne Pax Primary Care Provider: Jenell Milliner Other Clinician: Referring Provider: Eusebio Friendly Treating Provider/Extender: Rowan Blase in Treatment: 0 Verbal / Phone Orders: No Diagnosis Coding ICD-10 Coding Code Description L03.114 Cellulitis of left upper limb L98.492 Non-pressure chronic ulcer of skin of other sites with fat layer exposed E11.622 Type 2 diabetes mellitus with other skin ulcer I10 Essential (primary) hypertension Follow-up Appointments o Return Appointment in 2 weeks. Bathing/ Shower/ Hygiene o May shower; gently cleanse wound with antibacterial soap, rinse and pat dry prior to dressing wounds Wound Treatment Wound #1 - Hand - 3rd Digit Wound Laterality: Left, Distal Topical: Mupirocin Ointment 1 x Per Day/30 Days Discharge Instructions: Apply as directed by provider. Primary Dressing: Xeroform 4x4-HBD (in/in) 1 x Per Day/30 Days Discharge Instructions: Apply Xeroform 4x4-HBD (in/in) as directed Secondary Dressing: Coverlet Latex-Free Fabric Adhesive Dressings 1 x Per Day/30 Days Discharge Instructions: 1.5 x 2 Laboratory o Bacteria identified in Wound by Culture (MICRO) - non healing wound, red, painful, swollen - (ICD10 L98.492 - Non-pressure  chronic ulcer of skin of other sites with fat layer exposed) oooo LOINC Code: 6462-6 oooo Convenience Name: Wound culture routine Patient Medications Allergies: lecithin, soy Notifications Medication Indication Start End mupirocin 10/24/2021 DOSE topical 2 % ointment - ointment topical applied daily with each dressing change in a thin film to the wound bed until helaed Bactrim DS 10/24/2021 DOSE 1 - oral 800 mg-160 mg tablet - 1 tablet oral taken 2 times per day for 14 days meloxicam 10/24/2021 DOSE 1 - oral 15 mg tablet - 1 tablet oral taken 1 time per day for pain x 2 weeks maximum Electronic Signature(s) Signed: 10/24/2021 10:18:21 AM By: Lenda Kelp PA-C Entered By: Lenda Kelp on 10/24/2021  10:18:21 MANDI, MATTIOLI ([id number], Ilisha ([id number]) -------------------------------------------------------------------------------- Problem List Details Patient Name: Olivia Ramos, Olivia Ramos Date of Service: 10/24/2021 8:45 AM Medical Record Number: [id number] Patient Account Number: [id number] Date of Birth/Sex: 1955-04-19 (65 y.o. F) Treating RN: Yevonne Pax Primary Care Provider: Jenell Milliner Other Clinician: Referring Provider: Eusebio Friendly Treating Provider/Extender: Rowan Blase in Treatment: 0 Active Problems ICD-10 Encounter Code Description Active Date MDM Diagnosis L03.012 Cellulitis of left finger 10/24/2021 No Yes L98.492 Non-pressure chronic ulcer of skin of other sites with fat layer exposed 10/24/2021 No Yes E11.622 Type 2 diabetes mellitus with other skin ulcer 10/24/2021 No Yes I10 Essential (primary) hypertension 10/24/2021 No Yes Inactive Problems Resolved Problems Electronic Signature(s) Signed: 10/24/2021 10:07:21 AM By: Lenda Kelp PA-C Previous Signature: 10/24/2021 9:38:12 AM Version By: Lenda Kelp PA-C Entered By: Lenda Kelp on 10/24/2021 10:07:21 [place], Olivia Ramos  ([id number]) -------------------------------------------------------------------------------- Progress Note Details Patient Name: Olivia Ramos Date of Service: 10/24/2021 8:45 AM Medical Record Number: [id number] Patient Account Number: [id number] Date of Birth/Sex: 1956-02-09 (65 y.o. F) Treating RN: Yevonne Pax Primary Care Provider: Jenell Milliner Other Clinician: Referring Provider: Eusebio Friendly Treating Provider/Extender: Rowan Blase in Treatment: 0 Subjective Chief Complaint Information obtained from Patient Left 3rd finger ulcer History of Present Illness (HPI) 10-24-2021 upon evaluation today patient appears to be doing somewhat poorly in regard to a wound on her finger. This occurred after she was pulling on a hangnail. Subsequently this is something that seem to come up all of a sudden and has been extremely painful for her. Fortunately there does not appear to be any signs of active systemically though locally that is a different story. There is actually some eschar noted and it is extremely painful to touch she has been taking meloxicam which was helping to some degree. With that being said she has been on Keflex but that did not seem to help at all. The patient was referred to our office for further evaluation and treatment. She does have a history of diabetes mellitus type 2 with hemoglobin A1c of 6.3 on 09-15-2021. Patient History Allergies lecithin, soy (Severity: Severe) Social History Never smoker, Marital Status - Widowed, Alcohol Use - Never, Drug Use - No History, Caffeine Use - Daily. Medical History Respiratory Patient has history of Asthma Cardiovascular Patient has history of Hypertension Endocrine Patient has history of Type II Diabetes Patient is treated with Insulin, Oral Agents. Review of Systems (ROS) Eyes Complains or has symptoms of Glasses / Contacts. Genitourinary Denies complaints or symptoms of Kidney failure/ Dialysis,  Incontinence/dribbling. Integumentary (Skin) Complains or has symptoms of Wounds, Swelling. Objective Constitutional sitting or standing blood pressure is within target range for patient.. pulse regular and within target range for patient.Marland Kitchen respirations regular, non- labored and within target range for patient.Marland Kitchen temperature within target range for patient.. Well-nourished and well-hydrated in no acute distress. Vitals Time Taken: 9:04 AM, Height: 60 in, Source: Stated, Weight: 148 lbs, Source: Stated, BMI: 28.9, Temperature: 98.4 F, Pulse: 91 bpm, Respiratory Rate: 18 breaths/min, Blood Pressure: 192/101 mmHg. Eyes conjunctiva clear no eyelid edema noted. pupils equal round and reactive to light and accommodation. Ears, Nose, Mouth, and Throat no gross abnormality of ear auricles or external auditory canals. normal hearing noted during conversation. mucus membranes moist. Fickel, Anatalia ([id number]) Respiratory normal breathing without difficulty. Cardiovascular 2+ dorsalis pedis/posterior tibialis pulses. no clubbing, cyanosis, significant edema, Musculoskeletal normal gait and posture. no significant deformity or arthritic changes, no loss or range of motion, no clubbing. Psychiatric this  patient is able to make decisions and demonstrates good insight into disease process. Alert and Oriented x 3. pleasant and cooperative. General Notes: Upon inspection patient's wound actually appears to be likely a paronychia that has worsened given time and unfortunately there is some area of eschar here. The cellulitis seems to involve the tip of the finger in general but does not appear to be too deep. Fortunately I do not see any evidence of active infection locally or systemically at this time. I was able to perform debridement only after numbing her I did use 5 cc of 2% lidocaine into the finger which the patient then was completely numb and was able to allow me to debride the wound without  any complication whatsoever. Postdebridement the wound bed appears to be doing much better which is great news. Integumentary (Hair, Skin) Wound #1 status is Open. Original cause of wound was Gradually Appeared. The date acquired was: 08/04/2021. The wound is located on the Left,Distal Hand - 3rd Digit. The wound measures 0.4cm length x 0.5cm width x 0.1cm depth; 0.157cm^2 area and 0.016cm^3 volume. There is no tunneling or undermining noted. There is a none present amount of drainage noted. There is no granulation within the wound bed. There is a large (67-100%) amount of necrotic tissue within the wound bed including Eschar. Assessment Active Problems ICD-10 Cellulitis of left finger Non-pressure chronic ulcer of skin of other sites with fat layer exposed Type 2 diabetes mellitus with other skin ulcer Essential (primary) hypertension Procedures Wound #1 Pre-procedure diagnosis of Wound #1 is an Inflammatory located on the Left,Distal Hand - 3rd Digit . There was a Excisional Skin/Subcutaneous Tissue Debridement with a total area of 0.2 sq cm performed by Nelida MeuseStone, Kenesha Moshier E., PA-C. With the following instrument(s): Curette to remove Viable and Non-Viable tissue/material. Material removed includes Eschar, Subcutaneous Tissue, Slough, Skin: Dermis, and Skin: Epidermis after achieving pain control using Lidocaine Injectable: 2%. No specimens were taken. A time out was conducted at 09:45, prior to the start of the procedure. A Moderate amount of bleeding was controlled with Pressure. The procedure was tolerated well with a pain level of 0 throughout and a pain level of 0 following the procedure. Post Debridement Measurements: 0.4cm length x 0.5cm width x 0.1cm depth; 0.016cm^3 volume. Character of Wound/Ulcer Post Debridement is improved. Post procedure Diagnosis Wound #1: Same as Pre-Procedure Plan Follow-up Appointments: Return Appointment in 2 weeks. Bathing/ Shower/ Hygiene: May shower; gently  cleanse wound with antibacterial soap, rinse and pat dry prior to dressing wounds Laboratory ordered were: Wound culture routine - non healing wound, red, painful, swollen The following medication(s) was prescribed: mupirocin topical 2 % ointment ointment topical applied daily with each dressing change in a thin film to the wound bed until helaed starting 10/24/2021 Bactrim DS oral 800 mg-160 mg tablet 1 1 tablet oral taken 2 times per day for 14 days starting 10/24/2021 meloxicam oral 15 mg tablet 1 1 tablet oral taken 1 time per day for pain x 2 weeks maximum starting 10/24/2021 WOUND #1: - Hand - 3rd Digit Wound Laterality: Left, Distal Topical: Mupirocin Ointment 1 x Per Day/30 Days Discharge Instructions: Apply as directed by provider. [place], Olivia Ramos ([id number]) Primary Dressing: Xeroform 4x4-HBD (in/in) 1 x Per Day/30 Days Discharge Instructions: Apply Xeroform 4x4-HBD (in/in) as directed Secondary Dressing: Coverlet Latex-Free Fabric Adhesive Dressings 1 x Per Day/30 Days Discharge Instructions: 1.5 x 2 1. Based on what I am seeing I Ernie HewMinna go ahead and recommend that we have  the patient continue with the recommendation for mupirocin ointment topically which I think is good to be a good option here for her. 2. I am also going to go ahead and place her on oral antibiotic postdebridement I did perform a culture. I am going to send in a prescription for Bactrim DS for the patient which I think should also hopefully help get things under control here considering the severity of what we are seeing. 3. I am also sending a prescription for meloxicam to help with her pain control. She had run out and that we do not normally do this and even though I know her blood pressure is elevated that only thing that was really helping and I know this is good to be painful. When she gets home she is supposed to go ahead and take her blood pressure medication. We will see patient back for reevaluation in 1  week here in the clinic. If anything worsens or changes patient will contact our office for additional recommendations. Electronic Signature(s) Signed: 10/24/2021 10:18:34 AM By: Lenda Kelp PA-C Entered By: Lenda Kelp on 10/24/2021 10:18:34 Olivia Ramos, Olivia Ramos ([id number]) -------------------------------------------------------------------------------- ROS/PFSH Details Patient Name: Olivia Ramos Date of Service: 10/24/2021 8:45 AM Medical Record Number: [id number] Patient Account Number: [id number] Date of Birth/Sex: 29-Oct-1955 (65 y.o. F) Treating RN: Yevonne Pax Primary Care Provider: Jenell Milliner Other Clinician: Referring Provider: Eusebio Friendly Treating Provider/Extender: Rowan Blase in Treatment: 0 Eyes Complaints and Symptoms: Positive for: Glasses / Contacts Genitourinary Complaints and Symptoms: Negative for: Kidney failure/ Dialysis; Incontinence/dribbling Integumentary (Skin) Complaints and Symptoms: Positive for: Wounds; Swelling Respiratory Medical History: Positive for: Asthma Cardiovascular Medical History: Positive for: Hypertension Endocrine Medical History: Positive for: Type II Diabetes Time with diabetes: 25 Treated with: Insulin, Oral agents Immunizations Pneumococcal Vaccine: Received Pneumococcal Vaccination: No Implantable Devices None Family and Social History Never smoker; Marital Status - Widowed; Alcohol Use: Never; Drug Use: No History; Caffeine Use: Daily Electronic Signature(s) Signed: 10/24/2021 4:22:23 PM By: Lenda Kelp PA-C Signed: 10/28/2021 3:54:37 PM By: Yevonne Pax RN Entered By: Yevonne Pax on 10/24/2021 09:11:21 Olivia Ramos, Olivia Ramos ([id number]) -------------------------------------------------------------------------------- SuperBill Details Patient Name: Olivia Ramos Date of Service: 10/24/2021 Medical Record Number: [id number] Patient Account Number: [id number] Date of Birth/Sex: 1956/02/16 (65 y.o.  F) Treating RN: Yevonne Pax Primary Care Provider: Jenell Milliner Other Clinician: Referring Provider: Eusebio Friendly Treating Provider/Extender: Rowan Blase in Treatment: 0 Diagnosis Coding ICD-10 Codes Code Description L03.012 Cellulitis of left finger L98.492 Non-pressure chronic ulcer of skin of other sites with fat layer exposed E11.622 Type 2 diabetes mellitus with other skin ulcer I10 Essential (primary) hypertension Facility Procedures CPT4 Code: 64332951 Description: 99213 - WOUND CARE VISIT-LEV 3 EST PT Modifier: Quantity: 1 CPT4 Code: 88416606 Description: 11042 - DEB SUBQ TISSUE 20 SQ CM/< Modifier: Quantity: 1 CPT4 Code: Description: ICD-10 Diagnosis Description L98.492 Non-pressure chronic ulcer of skin of other sites with fat layer expose Modifier: d Quantity: Physician Procedures CPT4 Code: 3016010 Description: 99204 - WC PHYS LEVEL 4 - NEW PT Modifier: 25 Quantity: 1 CPT4 Code: Description: ICD-10 Diagnosis Description L03.012 Cellulitis of left finger L98.492 Non-pressure chronic ulcer of skin of other sites with fat layer expose E11.622 Type 2 diabetes mellitus with other skin ulcer I10 Essential (primary) hypertension Modifier: d Quantity: CPT4 Code: 9323557 Description: 11042 - WC PHYS SUBQ TISS 20 SQ CM Modifier: Quantity: 1 CPT4 Code: Description: ICD-10 Diagnosis Description L98.492 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

## 2021-10-28 NOTE — Progress Notes (Signed)
Olivia Ramos ([id number]) Visit Report for 10/24/2021 Allergy List Details Patient Name: Olivia Ramos, Olivia Ramos Date of Service: 10/24/2021 8:45 AM Medical Record Number: [id number] Patient Account Number: [id number] Date of Birth/Sex: [birth year] (66 y.o. F) Treating RN: Olivia Ramos Primary Care Olivia Ramos: Olivia Ramos Other Clinician: Referring Olivia Ramos: Olivia Ramos Treating Olivia Ramos: Olivia Ramos Weeks in Treatment: 0 Allergies Active Allergies lecithin, soy Severity: Severe Allergy Notes Electronic Signature(s) Signed: 10/28/2021 3:54:37 PM By: Olivia Pax RN Entered By: Olivia Ramos on 10/24/2021 95:28:41 Olivia Ramos ([id number]) -------------------------------------------------------------------------------- Arrival Information Details Patient Name: Olivia Ramos Date of Service: 10/24/2021 8:45 AM Medical Record Number: [id number] Patient Account Number: [id number] Date of Birth/Sex: 07/03/1955 (66 y.o. F) Treating RN: Olivia Ramos Primary Care Tahja Liao: Olivia Ramos Other Clinician: Referring Lamarion Mcevers: Olivia Ramos Treating Rodric Punch/Extender: Olivia Ramos in Treatment: 0 Visit Information Patient Arrived: Ambulatory Arrival Time: 09:02 Accompanied By: son Transfer Assistance: None Patient Identification Verified: Yes Secondary Verification Process Completed: Yes Patient Requires Transmission-Based Precautions: No Patient Has Alerts: No Electronic Signature(s) Signed: 10/28/2021 3:54:37 PM By: Olivia Pax RN Entered By: Olivia Ramos on 10/24/2021 09:03:21 Olivia Ramos ([id number]) -------------------------------------------------------------------------------- Clinic Level of Care Ramos Details Patient Name: Olivia Ramos Date of Service: 10/24/2021 8:45 AM Medical Record Number: [id number] Patient Account Number: [id number] Date of Birth/Sex: 1956-01-30 (66 y.o. F) Treating RN: Olivia Ramos Primary Care Caliya Narine: Olivia Ramos  Other Clinician: Referring Reynalda Canny: Olivia Ramos Treating Eddy Termine/Extender: Olivia Ramos in Treatment: 0 Clinic Level of Care Ramos Items TOOL 1 Quantity Score X - Use when Olivia Ramos and Procedure is performed on INITIAL visit 1 0 ASSESSMENTS - Nursing Ramos / Reassessment X - General Physical Exam (combine w/ comprehensive Ramos (listed just below) when performed on new 1 20 pt. evals) X- 1 25 Comprehensive Ramos (HX, ROS, Risk Assessments, Wounds Hx, etc.) ASSESSMENTS - Wound and Skin Ramos / Reassessment []  - Dermatologic / Skin Ramos (not related to wound area) 0 ASSESSMENTS - Ostomy and/or Continence Ramos and Care []  - Incontinence Ramos and Management 0 []  - 0 Ostomy Care Ramos and Management (repouching, etc.) PROCESS - Coordination of Care X - Simple Patient / Family Education for ongoing care 1 15 []  - 0 Complex (extensive) Patient / Family Education for ongoing care X- 1 10 Staff obtains , Records, Test Results / Process Orders []  - 0 Staff telephones HHA, Nursing Homes / Clarify orders / etc []  - 0 Routine Transfer to another Facility (non-emergent condition) []  - 0 Routine Hospital Admission (non-emergent condition) X- 1 15 New Admissions / / Ordering NPWT, Apligraf, etc. []  - 0 Emergency Hospital Admission (emergent condition) PROCESS - Special Needs []  - Pediatric / Minor Patient Management 0 []  - 0 Isolation Patient Management []  - 0 Hearing / Language / Visual special needs []  - 0 Ramos of Community assistance (transportation, D/C planning, etc.) []  - 0 Additional assistance / Altered mentation []  - 0 Support Surface(s) Ramos (bed, cushion, seat, etc.) INTERVENTIONS - Miscellaneous []  - External ear exam 0 []  - 0 Patient Transfer (multiple staff / / Similar devices) []  - 0 Simple Staple / Suture removal (25 or less) []  - 0 Complex Staple /  Suture removal (26 or more) []  - 0 Hypo/Hyperglycemic Management (do not check if billed separately) []  - 0 Ankle / Brachial Index (ABI) - do not check if billed separately Has the patient been seen at the hospital within the last three years: Yes Total Score: 85 Level Of Care: New/Established - Level 3 [name],  Ramos ([id number]) Electronic Signature(s) Signed: 10/28/2021 3:54:37 PM By: Olivia Pax RN Entered By: Olivia Ramos on 10/24/2021 09:59:40 Olivia Ramos ([id number]) -------------------------------------------------------------------------------- Encounter Discharge Information Details Patient Name: Olivia Ramos Date of Service: 10/24/2021 8:45 AM Medical Record Number: [id number] Patient Account Number: [id number] Date of Birth/Sex: [birth year] (66 y.o. F) Treating RN: Olivia Ramos Primary Care Aylani Spurlock: Olivia Ramos Other Clinician: Referring Olivia Ramos: Olivia Ramos Treating Olivia Ramos: Olivia Ramos in Treatment: 0 Encounter Discharge Information Items Post Procedure Vitals Discharge Condition: Stable Temperature (F): 98.4 Ambulatory Status: Ambulatory Pulse (bpm): 91 Discharge Destination: Home Respiratory Rate (breaths/min): 18 Transportation: Private Auto Blood Pressure (mmHg): 192/101 Accompanied By: son Schedule Follow-up Appointment: Yes Clinical Summary of Care: Electronic Signature(s) Signed: 10/28/2021 3:54:37 PM By: Olivia Pax RN Entered By: Olivia Ramos on 10/24/2021 10:01:22 Olivia Ramos ([id number]) -------------------------------------------------------------------------------- Olivia Ramos Details Patient Name: Olivia Ramos Date of Service: 10/24/2021 8:45 AM Medical Record Number: [id number] Patient Account Number: [id number] Date of Birth/Sex: October 05, 1955 (66 y.o. F) Treating RN: Olivia Ramos Primary Care Libero Puthoff: Olivia Ramos Other Clinician: Referring Lawrence Roldan: Olivia Ramos Treating  Olivia Ramos: Olivia Ramos Weeks in Treatment: 0 Electronic Signature(s) Signed: 10/28/2021 3:54:37 PM By: Olivia Pax RN Entered By: Olivia Ramos on 10/24/2021 09:19:42 Olivia Ramos ([id number]) -------------------------------------------------------------------------------- Multi Wound Chart Details Patient Name: Olivia Ramos Date of Service: 10/24/2021 8:45 AM Medical Record Number: [id number] Patient Account Number: [id number] Date of Birth/Sex: Dec 11, 1955 (66 y.o. F) Treating RN: Olivia Ramos Primary Care Fred Hammes: Olivia Ramos Other Clinician: Referring Jarquez Mestre: Olivia Ramos Treating Lielle Vandervort/Extender: Olivia Ramos in Treatment: 0 Vital Signs Height(in): 60 Pulse(bpm): 91 Weight(lbs): 148 Blood Pressure(mmHg): 192/101 Body Mass Index(BMI): 28.9 Temperature(F): 98.4 Respiratory Rate(breaths/min): 18 Photos: [N/A:N/A] Wound Location: Left, Distal Hand - 3rd Digit N/A N/A Wounding Event: Gradually Appeared N/A N/A Primary Etiology: Auto-immune N/A N/A Comorbid History: Asthma, Hypertension, Type II N/A N/A Diabetes Date Acquired: 08/04/2021 N/A N/A Weeks of Treatment: 0 N/A N/A Wound Status: Open N/A N/A Wound Recurrence: No N/A N/A Measurements L x W x D (cm) 0.4x0.5x0.1 N/A N/A Area (cm) : 0.157 N/A N/A Volume (cm) : 0.016 N/A N/A Classification: Full Thickness Without Exposed N/A N/A Support Structures Exudate Amount: None Present N/A N/A Granulation Amount: None Present (0%) N/A N/A Necrotic Amount: Large (67-100%) N/A N/A Necrotic Tissue: Eschar N/A N/A Exposed Structures: Fascia: No N/A N/A Fat Layer (Subcutaneous Tissue): No Tendon: No Muscle: No Joint: No Bone: No Epithelialization: None N/A N/A Treatment Notes Electronic Signature(s) Signed: 10/28/2021 3:54:37 PM By: Olivia Pax RN Entered By: Olivia Ramos on 10/24/2021 09:40:45 Olivia Ramos  ([id number]) -------------------------------------------------------------------------------- Multi-Disciplinary Care Plan Details Patient Name: Olivia Ramos Date of Service: 10/24/2021 8:45 AM Medical Record Number: [id number] Patient Account Number: [id number] Date of Birth/Sex: 1955-09-21 (66 y.o. F) Treating RN: Olivia Ramos Primary Care Masaichi Kracht: Olivia Ramos Other Clinician: Referring Charda Janis: Olivia Ramos Treating Idy Rawling/Extender: Olivia Ramos in Treatment: 0 Active Inactive Wound/Skin Impairment Nursing Diagnoses: Knowledge deficit related to ulceration/compromised skin integrity Goals: Patient/caregiver will verbalize understanding of skin care regimen Date Initiated: 10/24/2021 Target Resolution Date: 11/24/2021 Goal Status: Active Ulcer/skin breakdown will have a volume reduction of 30% by week 4 Date Initiated: 10/24/2021 Target Resolution Date: 11/24/2021 Goal Status: Active Ulcer/skin breakdown will have a volume reduction of 50% by week 8 Date Initiated: 10/24/2021 Target Resolution Date: 12/25/2021 Goal Status: Active Ulcer/skin breakdown will have a volume reduction of 80% by week 12 Date Initiated: 10/24/2021 Target Resolution Date: 01/24/2022 Goal Status: Active Ulcer/skin breakdown will heal within 14 weeks Date Initiated:  10/24/2021 Target Resolution Date: 02/24/2022 Goal Status: Active Interventions: Assess patient/caregiver ability to obtain necessary supplies Assess patient/caregiver ability to perform ulcer/skin care regimen upon admission and as needed Assess ulceration(s) every visit Notes: Electronic Signature(s) Signed: 10/28/2021 3:54:37 PM By: Olivia Pax RN Entered By: Olivia Ramos on 10/24/2021 09:40:32 Star, Makailey ([id number]) -------------------------------------------------------------------------------- Pain Ramos Details Patient Name: Olivia Ramos Date of Service: 10/24/2021 8:45 AM Medical Record Number:  [id number] Patient Account Number: [id number] Date of Birth/Sex: 1956/01/01 (66 y.o. F) Treating RN: Olivia Ramos Primary Care Zaniyah Wernette: Olivia Ramos Other Clinician: Referring Ferrin Liebig: Olivia Ramos Treating Monserrate Blaschke/Extender: Olivia Ramos in Treatment: 0 Active Problems Location of Pain Severity and Description of Pain Patient Has Paino Yes Site Locations With Dressing Change: Yes Duration of the Pain. Constant / Intermittento Constant Rate the pain. Current Pain Level: 6 Worst Pain Level: 10 Least Pain Level: 4 Tolerable Pain Level: 5 Character of Pain Describe the Pain: Burning Pain Management and Medication Current Pain Management: Medication: Yes Cold Application: No Rest: Yes Massage: No Activity: No T.E.N.S.: No Heat Application: No Leg drop or elevation: No Is the Current Pain Management Adequate: Inadequate How does your wound impact your activities of daily livingo Sleep: Yes Bathing: No Appetite: Yes Relationship With Others: No Bladder Continence: No Emotions: No Bowel Continence: No Work: No Toileting: No Drive: No Dressing: No Hobbies: No Electronic Signature(s) Signed: 10/28/2021 3:54:37 PM By: Olivia Pax RN Entered By: Olivia Ramos on 10/24/2021 09:04:20 Olivia Ramos ([id number]) -------------------------------------------------------------------------------- Patient/Caregiver Education Details Patient Name: Olivia Ramos Date of Service: 10/24/2021 8:45 AM Medical Record Number: [id number] Patient Account Number: [id number] Date of Birth/Gender: 18-Nov-1955 (66 y.o. F) Treating RN: Olivia Ramos Primary Care Physician: Olivia Ramos Other Clinician: Referring Physician: Eusebio Ramos Treating Physician/Extender: Olivia Ramos in Treatment: 0 Education Ramos Education Provided To: Patient Education Topics Provided Wound/Skin Impairment: Methods: Explain/Verbal Responses: State content correctly Electronic  Signature(s) Signed: 10/28/2021 3:54:37 PM By: Olivia Pax RN Entered By: Olivia Ramos on 10/24/2021 10:00:07 Olivia Ramos ([id number]) -------------------------------------------------------------------------------- Wound Ramos Details Patient Name: Olivia Ramos Date of Service: 10/24/2021 8:45 AM Medical Record Number: [id number] Patient Account Number: [id number] Date of Birth/Sex: 03-Apr-1956 (66 y.o. F) Treating RN: Olivia Ramos Primary Care Jahkai Yandell: Olivia Ramos Other Clinician: Referring Devansh Riese: Olivia Ramos Treating Zacaria Pousson/Extender: Olivia Ramos in Treatment: 0 Wound Status Wound Number: 1 Primary Etiology: Auto-immune Wound Location: Left, Distal Hand - 3rd Digit Wound Status: Open Wounding Event: Gradually Appeared Comorbid History: Asthma, Hypertension, Type II Diabetes Date Acquired: 08/04/2021 Weeks Of Treatment: 0 Clustered Wound: No Photos Wound Measurements Length: (cm) 0.4 Width: (cm) 0.5 Depth: (cm) 0.1 Area: (cm) 0.157 Volume: (cm) 0.016 % Reduction in Area: % Reduction in Volume: Epithelialization: None Tunneling: No Undermining: No Wound Description Classification: Full Thickness Without Exposed Support Structures Exudate Amount: None Present Foul Odor After Cleansing: No Slough/Fibrino Yes Wound Bed Granulation Amount: None Present (0%) Exposed Structure Necrotic Amount: Large (67-100%) Fascia Exposed: No Necrotic Quality: Eschar Fat Layer (Subcutaneous Tissue) Exposed: No Tendon Exposed: No Muscle Exposed: No Joint Exposed: No Bone Exposed: No Electronic Signature(s) Signed: 10/28/2021 3:54:37 PM By: Olivia Pax RN Entered By: Olivia Ramos on 10/24/2021 09:19:02 Olivia Ramos ([id number]) -------------------------------------------------------------------------------- Vitals Details Patient Name: Olivia Ramos Date of Service: 10/24/2021 8:45 AM Medical Record Number: [id number] Patient Account Number:  [id number] Date of Birth/Sex: 1955/11/10 (66 y.o. F) Treating RN: Olivia Ramos Primary Care Reygan Heagle: Olivia Ramos Other Clinician: Referring Joclyn Alsobrook: Olivia Ramos Treating Dyrell Tuccillo/Extender: Olivia Ramos Weeks in Treatment: 0 Vital Signs Time  Taken: 09:04 Temperature (F): 98.4 Height (in): 60 Pulse (bpm): 91 Source: Stated Respiratory Rate (breaths/min): 18 Weight (lbs): 148 Blood Pressure (mmHg): 192/101 Source: Stated Reference Range: 80 - 120 mg / dl Body Mass Index (BMI): 28.9 Electronic Signature(s) Signed: 10/28/2021 3:54:37 PM By: Olivia Pax RN Entered By: Olivia Ramos on 10/24/2021 09:07:47

## 2021-10-28 NOTE — Progress Notes (Signed)
Olivia Ramos ([id number]) Visit Report for 10/24/2021 Abuse Risk Screen Details Patient Name: Olivia Ramos, Olivia Ramos Date of Service: 10/24/2021 8:45 AM Medical Record Number: [id number] Patient Account Number: [id number] Date of Birth/Sex: Nov 23, 1955 (66 y.o. F) Treating RN: Yevonne Pax Primary Care Luismanuel Corman: Jenell Milliner Other Clinician: Referring Arlando Leisinger: Eusebio Friendly Treating Renso Swett/Extender: Rowan Blase in Treatment: 0 Abuse Risk Screen Items Answer ABUSE RISK SCREEN: Has anyone close to you tried to hurt or harm you recentlyo No Do you feel uncomfortable with anyone in your familyo No Has anyone forced you do things that you didnot want to doo No Electronic Signature(s) Signed: 10/28/2021 3:54:37 PM By: Yevonne Pax RN Entered By: Yevonne Pax on 10/24/2021 09:11:28 Olivia Ramos ([id number]) -------------------------------------------------------------------------------- Activities of Daily Living Details Patient Name: Olivia Ramos Date of Service: 10/24/2021 8:45 AM Medical Record Number: [id number] Patient Account Number: [id number] Date of Birth/Sex: 05-27-1955 (66 y.o. F) Treating RN: Yevonne Pax Primary Care Aanya Haynes: Jenell Milliner Other Clinician: Referring Robbi Spells: Eusebio Friendly Treating Lissandro Dilorenzo/Extender: Rowan Blase in Treatment: 0 Activities of Daily Living Items Answer Activities of Daily Living (Please select one for each item) Drive Automobile Completely Able Take Medications Completely Able Use Telephone Completely Able Care for Appearance Completely Able Use Toilet Completely Able Bath / Shower Completely Able Dress Self Completely Able Feed Self Completely Able Walk Completely Able Get In / Out Bed Completely Able Housework Completely Able Prepare Meals Completely Able Handle Money Completely Able Shop for Self Completely Able Electronic Signature(s) Signed: 10/28/2021 3:54:37 PM By: Yevonne Pax RN Entered By: Yevonne Pax on 10/24/2021 09:11:56 Olivia Ramos ([id number]) -------------------------------------------------------------------------------- Education Screening Details Patient Name: Olivia Ramos Date of Service: 10/24/2021 8:45 AM Medical Record Number: [id number] Patient Account Number: [id number] Date of Birth/Sex: 04-09-1955 (66 y.o. F) Treating RN: Yevonne Pax Primary Care Ryan Ogborn: Jenell Milliner Other Clinician: Referring Emilo Gras: Eusebio Friendly Treating Kade Rickels/Extender: Rowan Blase in Treatment: 0 Primary Learner Assessed: Patient Learning Preferences/Education Level/Primary Language Learning Preference: Explanation Highest Education Level: High School Preferred Language: Patient Declined Cognitive Barrier Language Barrier: No Translator Needed: No Memory Deficit: No Emotional Barrier: No Cultural/Religious Beliefs Affecting Medical Care: No Physical Barrier Impaired Vision: Yes Glasses Impaired Hearing: No Decreased Hand dexterity: No Knowledge/Comprehension Knowledge Level: Medium Comprehension Level: Medium Ability to understand written instructions: Medium Ability to understand verbal instructions: Medium Motivation Anxiety Level: Anxious Cooperation: Cooperative Education Importance: Acknowledges Need Interest in Health Problems: Asks Questions Perception: Coherent Willingness to Engage in Self-Management High Activities: Readiness to Engage in Self-Management High Activities: Electronic Signature(s) Signed: 10/28/2021 3:54:37 PM By: Yevonne Pax RN Entered By: Yevonne Pax on 10/24/2021 09:13:05 Olivia Ramos ([id number]) -------------------------------------------------------------------------------- Fall Risk Assessment Details Patient Name: Olivia Ramos Date of Service: 10/24/2021 8:45 AM Medical Record Number: [id number] Patient Account Number: [id number] Date of Birth/Sex: 29-May-1955 (66 y.o. F) Treating RN: Yevonne Pax Primary  Care Karne Ozga: Jenell Milliner Other Clinician: Referring Desiray Orchard: Eusebio Friendly Treating Raeli Wiens/Extender: Rowan Blase in Treatment: 0 Fall Risk Assessment Items Have you had 2 or more falls in the last 12 monthso 0 No Have you had any fall that resulted in injury in the last 12 monthso 0 No FALLS RISK SCREEN History of falling - immediate or within 3 months 0 No Secondary diagnosis (Do you have 2 or more medical diagnoseso) 0 No Ambulatory aid None/bed rest/wheelchair/nurse 0 No Crutches/cane/walker 0 No Furniture 0 No Intravenous therapy Access/Saline/Heparin Lock 0 No Gait/Transferring Normal/ bed rest/ wheelchair 0 No Weak (short steps with or without shuffle, stooped but able to  lift head while walking, may 0 No seek support from furniture) Impaired (short steps with shuffle, may have difficulty arising from chair, head down, impaired 0 No balance) Mental Status Oriented to own ability 0 No Electronic Signature(s) Signed: 10/28/2021 3:54:37 PM By: Yevonne Pax RN Entered By: Yevonne Pax on 10/24/2021 09:13:15 [place], Jocelynn ([id number]) -------------------------------------------------------------------------------- Foot Assessment Details Patient Name: Olivia Ramos Date of Service: 10/24/2021 8:45 AM Medical Record Number: [id number] Patient Account Number: [id number] Date of Birth/Sex: 04-Dec-1955 (66 y.o. F) Treating RN: Yevonne Pax Primary Care Demetrus Pavao: Jenell Milliner Other Clinician: Referring Janella Rogala: Eusebio Friendly Treating Tramain Gershman/Extender: Rowan Blase in Treatment: 0 Foot Assessment Items Site Locations + = Sensation present, - = Sensation absent, C = Callus, U = Ulcer R = Redness, W = Warmth, M = Maceration, PU = Pre-ulcerative lesion F = Fissure, S = Swelling, D = Dryness Assessment Right: Left: Other Deformity: No No Prior Foot Ulcer: No No Prior Amputation: No No Charcot Joint: No No Ambulatory Status: Ambulatory Without  Help Gait: Steady Electronic Signature(s) Signed: 10/28/2021 3:54:37 PM By: Yevonne Pax RN Entered By: Yevonne Pax on 10/24/2021 09:13:52 Jasek, Charae ([id number]) -------------------------------------------------------------------------------- Nutrition Risk Screening Details Patient Name: Olivia Ramos Date of Service: 10/24/2021 8:45 AM Medical Record Number: [id number] Patient Account Number: [id number] Date of Birth/Sex: 06-15-1955 (66 y.o. F) Treating RN: Yevonne Pax Primary Care Aaliyah Gavel: Jenell Milliner Other Clinician: Referring Harmonii Karle: Eusebio Friendly Treating Marili Vader/Extender: Rowan Blase in Treatment: 0 Height (in): 60 Weight (lbs): 148 Body Mass Index (BMI): 28.9 Nutrition Risk Screening Items Score Screening NUTRITION RISK SCREEN: I have an illness or condition that made me change the kind and/or amount of food I eat 0 No I eat fewer than two meals per day 0 No I eat few fruits and vegetables, or milk products 0 No I have three or more drinks of beer, liquor or wine almost every day 0 No I have tooth or mouth problems that make it hard for me to eat 0 No I don't always have enough money to buy the food I need 0 No I eat alone most of the time 0 No I take three or more different prescribed or over-the-counter drugs a day 1 Yes Without wanting to, I have lost or gained 10 pounds in the last six months 0 No I am not always physically able to shop, cook and/or feed myself 0 No Nutrition Protocols Good Risk Protocol 0 No interventions needed Moderate Risk Protocol High Risk Proctocol Risk Level: Good Risk Score: 1 Electronic Signature(s) Signed: 10/28/2021 3:54:37 PM By: Yevonne Pax RN Entered By: Yevonne Pax on 10/24/2021 09:13:41

## 2021-10-29 LAB — AEROBIC/ANAEROBIC CULTURE W GRAM STAIN (SURGICAL/DEEP WOUND): Gram Stain: NONE SEEN

## 2021-11-01 ENCOUNTER — Ambulatory Visit: Payer: BC Managed Care – PPO

## 2021-11-01 ENCOUNTER — Ambulatory Visit
Admission: EM | Admit: 2021-11-01 | Discharge: 2021-11-01 | Disposition: A | Discharge status: Home / Self Care | Payer: BC Managed Care – PPO

## 2021-11-01 DIAGNOSIS — L03012 Cellulitis of left finger: Secondary | ICD-10-CM

## 2021-11-01 MED ORDER — HYDROCODONE-ACETAMINOPHEN 5-325 MG PO TABS: 1.0000 | ORAL_TABLET | ORAL | 0 refills | Status: DC | PRN

## 2021-11-01 NOTE — ED Triage Notes (Signed)
Patient reports that she does not have proper blood flow to her left hand ring finger.   There was some infection -- they were told it was a wart. Therefore they used over the counter medication to "zap" it. Patient reports that now she has a chemical burn on that finger.   She was placed on an antibiotic but it has not gotten any better   Patient reports it does hurt to move.

## 2021-11-01 NOTE — Discharge Instructions (Addendum)
Please continue to follow-up with wound clinic.  Continue with topical antibiotic ointment and Bactrim.  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  Please take Norco for moderate to severe pain.

## 2021-11-01 NOTE — ED Provider Notes (Signed)
[facility]    CSN: [id number] Arrival date & time: 11/01/21  1331      History   Chief Complaint Chief Complaint  Patient presents with   Finger Injury    Left hand ring finger    HPI Olivia Ramos is a 66 y.o. female.  Presents with son for evaluation of left ring finger wound.  Wound has been present for nearly 4 months.  Patient was seen in urgent care on 10/11/2021 where pictures were obtained and reviewed today.  Patient had x-ray showing no evidence of osteomyelitis.  Patient followed up with wound clinic.  She has completed cephalexin and was placed on mupirocin ointment and oral Bactrim along with meloxicam for pain by the wound clinic.  She is underwent 1 debridement and is scheduled to follow-up this Friday for second debridement.  Overall swelling and redness seems to be improving slowly.  Patient states she is having a lot of pain on the DIP joint.  No pain along the pulp space.  No drainage  HPI  Past Medical History:  Diagnosis Date   Arthritis    Asthma    Diabetes mellitus without complication ([place])    Hypertension     Patient Active Problem List   Diagnosis Date Noted   Mixed hyperlipidemia 08/03/2017   Background diabetic retinopathy ([place]) 07/01/2017   Carpal tunnel syndrome of left wrist 05/31/2017   Onychomycosis 05/17/2017   Osteoarthritis, hand 04/16/2017   Diabetic neuropathy ([place]) 08/21/2016   Constipation 07/02/2016   Asthma 07/02/2016   GERD (gastroesophageal reflux disease) 02/18/2015   History of colon polyps 02/18/2015   Osteoarthritis of both feet 02/18/2015   Hyperlipidemia 11/26/2014   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      Home Medications    Prior to Admission  medications   Medication Sig Start Date End Date Taking? Authorizing Provider  albuterol (VENTOLIN HFA) 108 (90 Base) MCG/ACT inhaler Inhale 2 puffs into the lungs every 4 (four) hours as needed for wheezing or shortness of breath. 11/14/19  Yes Lamptey, Myrene Galas, MD  atorvastatin (LIPITOR) 80 MG tablet Take 1 tablet (80 mg total) by mouth daily. 04/04/18  Yes Juline Patch, MD  benzonatate (TESSALON) 100 MG capsule Take 1 capsule (100 mg total) by mouth every 8 (eight) hours. 11/14/19  Yes Lamptey, Myrene Galas, MD  famotidine-calcium carbonate-magnesium hydroxide (PEPCID COMPLETE) 10-800-165 MG chewable tablet Chew 1 tablet by mouth daily as needed.   Yes [provider]  Fluticasone-Salmeterol (ADVAIR) 250-50 MCG/DOSE AEPB Inhale 1 puff into the lungs 2 (two) times daily. 04/04/18  Yes Otilio Miu C, MD  glucose blood (FREESTYLE LITE) test strip Use as instructed 04/04/18  Yes Juline Patch, MD  glucose monitoring kit (FREESTYLE) monitoring kit 1 each by Does not apply route daily. Check glucose once in the morning before breakfast and 1 hour after a meal 04/04/18  Yes Juline Patch, MD  HYDROcodone-acetaminophen (NORCO) 5-325 MG tablet Take 1 tablet by mouth every 4 (four) hours as needed for moderate pain. 11/01/21  Yes Duanne Guess, PA-C  Insulin Glargine (BASAGLAR KWIKPEN) 100 UNIT/ML SOPN INJECT 0.35 MLS (35 UNITS TOTAL) INTO THE SKIN DAILY. 09/20/18  Yes Otilio Miu  C, MD  Insulin Pen Needle (PEN NEEDLES) 31G X 8 MM MISC Use as directed 04/04/18  Yes Juline Patch, MD  Lancets (FREESTYLE) lancets Use as instructed 04/04/18  Yes Juline Patch, MD  losartan (COZAAR) 50 MG tablet Take 1 tablet (50 mg total) by mouth daily. 04/04/18  Yes Juline Patch, MD  meloxicam (MOBIC) 15 MG tablet Take 15 mg by mouth daily. 10/24/21  Yes [provider]  metFORMIN (GLUCOPHAGE) 1000 MG tablet TAKE 1 TABLET (1,000 MG TOTAL) BY MOUTH 2 (TWO) TIMES DAILY WITH A MEAL. 10/03/18  Yes  Juline Patch, MD  mupirocin ointment (BACTROBAN) 2 % Apply topically daily. 10/24/21  Yes [provider]  sulfamethoxazole-trimethoprim (BACTRIM DS) 800-160 MG tablet Take 1 tablet by mouth 2 (two) times daily. 10/24/21  Yes [provider]    Family History Family History  Problem Relation Age of Onset   Heart attack Mother    Diabetes Mother    Hypertension Father    Cancer Sister        breast   Heart attack Brother    Stroke Sister    Breast cancer Neg Hx     Social History Social History   Tobacco Use   Smoking status: Never   Smokeless tobacco: Never  Vaping Use   Vaping Use: Never used  Substance Use Topics   Alcohol use: No    Alcohol/week: 0.0 standard drinks of alcohol   Drug use: No     Allergies   Soy allergy   Review of Systems Review of Systems   Physical Exam Triage Vital Signs ED Triage Vitals  Enc Vitals Group     BP 11/01/21 1355 (!) 173/110     Pulse Rate 11/01/21 1355 92     Resp --      Temp 11/01/21 1355 98.9 F (37.2 C)     Temp Source 11/01/21 1355 Oral     SpO2 11/01/21 1355 93 %     Weight 11/01/21 1352 146 lb (66.2 kg)     Height 11/01/21 1352 5' 1"  (1.549 m)     Head Circumference --      Peak Flow --      Pain Score 11/01/21 1352 10     Pain Loc --      Pain Edu? --      Excl. in [place]? --    No data found.  Updated Vital Signs BP (!) 173/110 (BP Location: Left Arm)   Pulse 92   Temp 98.9 F (37.2 C) (Oral)   Ht 5' 1"  (1.549 m)   Wt 146 lb (66.2 kg)   SpO2 93%   BMI 27.59 kg/m   Visual Acuity Right Eye Distance:   Left Eye Distance:   Bilateral Distance:    Right Eye Near:   Left Eye Near:    Bilateral Near:     Physical Exam Constitutional:      Appearance: She is well-developed.  HENT:     Head: Normocephalic and atraumatic.  Eyes:     Conjunctiva/sclera: Conjunctivae normal.  Cardiovascular:     Rate and Rhythm: Normal rate.  Pulmonary:     Effort: Pulmonary effort is normal.  No respiratory distress.  Musculoskeletal:        General: Normal range of motion.     Cervical back: Normal range of motion.     Comments: Left ring finger shows mild swelling with no pulp space tenderness throughout the distal phalanx.  There is eschar along the radial aspect of the distal phalanx at the lateral nail fold distally.  There is no purulent drainage.  Nail is intact.  Limited range of motion of the DIP joint but normal PIP and MCP range of motion.  No sign of flexor tenosynovitis, digit is without swelling except for the DIP joint which is mild and nontender along the pulp space  Skin:    General: Skin is warm.     Findings: No rash.  Neurological:     Mental Status: She is alert and oriented to person, place, and time.  Psychiatric:        Behavior: Behavior normal.        Thought Content: Thought content normal.      UC Treatments / Results  Labs (all labs ordered are listed, but only abnormal results are displayed) Labs Reviewed - No data to display  EKG   Radiology DG Finger Ring Left  Result Date: 11/01/2021 CLINICAL DATA:  Pain and swelling. EXAM: LEFT RING FINGER 2+V COMPARISON:  None Available. FINDINGS: Mild soft tissue abnormality to the distal third finger. No soft tissue gas identified. No underlying bony erosion is noted. Vascular calcifications are identified diffusely throughout the visualized hand. Degenerative changes in the fingers, particularly the visualized distal interphalangeal joints. IMPRESSION: No evidence of osteomyelitis.  Degenerative changes. Electronically Signed   By: Dorise Bullion III M.D.   On: 11/01/2021 14:26    Procedures Procedures (including critical care time)  Medications Ordered in UC Medications - No data to display  Initial Impression / Assessment and Plan / UC Course  I have reviewed the triage vital signs and the nursing notes.  Pertinent labs & imaging results that were available during my care of the patient were  reviewed by me and considered in my medical decision making (see chart for details).     67 year old female with chronic wound to the left ring finger distally.  Today's exam seems to show improvement from 10/11/2021 urgent care pictures.  She has same area distribution of eschar tissue with erythema but erythema seems to be less intense with overall less swelling.  On exam she has no pulse breast tenderness and no sign of a infectious flexor tenosynovitis.  X-rays today show no osteomyelitis.  Recommend she continue with Bactrim and topical antibiotic ointment and continue with wound clinic for further debridement.  Patient does have severe degenerative changes of the DIP joint which could be a source of pain for her.  The cellulitis does not appear to be tender on exam today with palpation.  Discussed follow-up with orthopedics.  She understands signs symptoms return to the urgent care for Final Clinical Impressions(s) / UC Diagnoses   Final diagnoses:  Cellulitis of left ring finger   Discharge Instructions   None    ED Prescriptions     Medication Sig Dispense Auth. Provider   HYDROcodone-acetaminophen (NORCO) 5-325 MG tablet Take 1 tablet by mouth every 4 (four) hours as needed for moderate pain. 20 tablet Duanne Guess, PA-C      I have reviewed the PDMP during this encounter.   Duanne Guess, PA-C 11/01/21 1434

## 2021-11-07 ENCOUNTER — Encounter: Payer: BC Managed Care – PPO | Attending: Physician Assistant | Admitting: Physician Assistant

## 2021-11-07 DIAGNOSIS — L98492 Non-pressure chronic ulcer of skin of other sites with fat layer exposed: Secondary | ICD-10-CM | POA: Insufficient documentation

## 2021-11-07 DIAGNOSIS — E11622 Type 2 diabetes mellitus with other skin ulcer: Secondary | ICD-10-CM | POA: Diagnosis not present

## 2021-11-07 DIAGNOSIS — L03012 Cellulitis of left finger: Secondary | ICD-10-CM | POA: Diagnosis not present

## 2021-11-07 DIAGNOSIS — I10 Essential (primary) hypertension: Secondary | ICD-10-CM | POA: Diagnosis not present

## 2021-11-07 NOTE — Progress Notes (Addendum)
ZELENA, BUSHONG ([id number]) Visit Report for 11/07/2021 Chief Complaint Document Details Patient Name: Olivia Ramos, Olivia Ramos Date of Service: 11/07/2021 12:30 PM Medical Record Number: [id number] Patient Account Number: [id number] Date of Birth/Sex: Dec 15, 1955 (65 y.o. F) Treating RN: Huel Coventry Primary Care Provider: Jenell Milliner Other Clinician: Betha Loa Referring Provider: Jenell Milliner Treating Provider/Extender: Rowan Blase in Treatment: 2 Information Obtained from: Patient Chief Complaint Left 3rd finger ulcer Electronic Signature(s) Signed: 11/07/2021 1:01:30 PM By: Lenda Kelp PA-C Entered By: Lenda Kelp on 11/07/2021 13:01:30 Olivia Ramos ([id number]) -------------------------------------------------------------------------------- HPI Details Patient Name: Olivia Ramos Date of Service: 11/07/2021 12:30 PM Medical Record Number: [id number] Patient Account Number: [id number] Date of Birth/Sex: [birth year] (65 y.o. F) Treating RN: Huel Coventry Primary Care Provider: Jenell Milliner Other Clinician: Betha Loa Referring Provider: Jenell Milliner Treating Provider/Extender: Rowan Blase in Treatment: 2 History of Present Illness HPI Description: 10-24-2021 upon evaluation today patient appears to be doing somewhat poorly in regard to a wound on her finger. This occurred after she was pulling on a hangnail. Subsequently this is something that seem to come up all of a sudden and has been extremely painful for her. Fortunately there does not appear to be any signs of active systemically though locally that is a different story. There is actually some eschar noted and it is extremely painful to touch she has been taking meloxicam which was helping to some degree. With that being said she has been on Keflex but that did not seem to help at all. The patient was referred to our office for further evaluation and treatment. She does have a history of diabetes  mellitus type 2 with hemoglobin A1c of 6.3 on 09-15-2021. 11-07-2021 upon evaluation today patient appears to be doing really a little bit worse in my opinion compared to last time I saw her. Fortunately I do not see any evidence of infection systemically but unfortunately she is still having what appears to be some infection locally. I do not think that there is any need for sending her to the ER or anything that drastic but I do believe switching her antibiotic and switch her over to [facility] as well should probably be the best way to go at this point. Electronic Signature(s) Signed: 11/07/2021 3:27:45 PM By: Lenda Kelp PA-C Entered By: Lenda Kelp on 11/07/2021 15:27:45 MIRABELLA, HILARIO ([id number]) -------------------------------------------------------------------------------- Physical Exam Details Patient Name: Olivia Ramos Date of Service: 11/07/2021 12:30 PM Medical Record Number: [id number] Patient Account Number: [id number] Date of Birth/Sex: 11/03/1955 (65 y.o. F) Treating RN: Huel Coventry Primary Care Provider: Jenell Milliner Other Clinician: Betha Loa Referring Provider: Jenell Milliner Treating Provider/Extender: Rowan Blase in Treatment: 2 Constitutional Well-nourished and well-hydrated in no acute distress. Respiratory normal breathing without difficulty. Psychiatric this patient is able to make decisions and demonstrates good insight into disease process. Alert and Oriented x 3. pleasant and cooperative. Notes Upon inspection patient's wound bed actually showed signs of good granulation and epithelization at this point. Fortunately I do not see any evidence of infection which is great news and overall I am very pleased systemically though locally I am concerned about infection since she has completed the Bactrim and has not really improved dramatically I Minna recommend switching over to Levaquin to see if this can get better as it does cover for  gram-negative's as well. Electronic Signature(s) Signed: 11/07/2021 3:28:10 PM By: Lenda Kelp PA-C Entered By: Lenda Kelp on 11/07/2021 15:28:10 [place], Byrd Hesselbach ([id number]) -------------------------------------------------------------------------------- Physician Orders Details  Patient Name: Olivia Ramos Date of Service: 11/07/2021 12:30 PM Medical Record Number: [id number] Patient Account Number: [id number] Date of Birth/Sex: [birth year] (65 y.o. F) Treating RN: Huel Coventry Primary Care Provider: Jenell Milliner Other Clinician: Betha Loa Referring Provider: Jenell Milliner Treating Provider/Extender: Rowan Blase in Treatment: 2 Verbal / Phone Orders: No Diagnosis Coding ICD-10 Coding Code Description L03.012 Cellulitis of left finger L98.492 Non-pressure chronic ulcer of skin of other sites with fat layer exposed E11.622 Type 2 diabetes mellitus with other skin ulcer I10 Essential (primary) hypertension Follow-up Appointments o Return Appointment in 2 weeks. Bathing/ Shower/ Hygiene o May shower; gently cleanse wound with antibacterial soap, rinse and pat dry prior to dressing wounds Wound Treatment Wound #1 - Hand - 3rd Digit Wound Laterality: Left, Distal Topical: Santyl Collagenase Ointment, 30 (gm), tube 1 x Per Day/30 Days Discharge Instructions: apply nickel thick to wound bed only Primary Dressing: Xeroform 4x4-HBD (in/in) 1 x Per Day/30 Days Discharge Instructions: Apply Xeroform 4x4-HBD (in/in) as directed Secondary Dressing: Coverlet Latex-Free Fabric Adhesive Dressings 1 x Per Day/30 Days Discharge Instructions: 1.5 x 2 Patient Medications Allergies: lecithin, soy Notifications Medication Indication Start End levofloxacin 11/07/2021 DOSE 1 - oral 500 mg tablet - 1 tablet oral taken 1 time per day for 14 days. Do not take Pepcid Complete at the same time as this medication Santyl 11/07/2021 DOSE topical 250 unit/gram ointment - ointment  topical Apply nickel thick daily to the wound bed and then cover with a dressing as directed in clinic x 30 days Electronic Signature(s) Signed: 11/07/2021 1:21:47 PM By: Lenda Kelp PA-C Entered By: Lenda Kelp on 11/07/2021 13:21:47 [place], Jolynn ([id number]) -------------------------------------------------------------------------------- Problem List Details Patient Name: Olivia Ramos Date of Service: 11/07/2021 12:30 PM Medical Record Number: [id number] Patient Account Number: [id number] Date of Birth/Sex: [birth year] (65 y.o. F) Treating RN: Huel Coventry Primary Care Provider: Jenell Milliner Other Clinician: Betha Loa Referring Provider: Jenell Milliner Treating Provider/Extender: Rowan Blase in Treatment: 2 Active Problems ICD-10 Encounter Code Description Active Date MDM Diagnosis L03.012 Cellulitis of left finger 10/24/2021 No Yes L98.492 Non-pressure chronic ulcer of skin of other sites with fat layer exposed 10/24/2021 No Yes E11.622 Type 2 diabetes mellitus with other skin ulcer 10/24/2021 No Yes I10 Essential (primary) hypertension 10/24/2021 No Yes Inactive Problems Resolved Problems Electronic Signature(s) Signed: 11/07/2021 1:01:27 PM By: Lenda Kelp PA-C Entered By: Lenda Kelp on 11/07/2021 13:01:27 [place], Jolea ([id number]) -------------------------------------------------------------------------------- Progress Note Details Patient Name: Olivia Ramos Date of Service: 11/07/2021 12:30 PM Medical Record Number: [id number] Patient Account Number: [id number] Date of Birth/Sex: [birth year] (65 y.o. F) Treating RN: Huel Coventry Primary Care Provider: Jenell Milliner Other Clinician: Betha Loa Referring Provider: Jenell Milliner Treating Provider/Extender: Rowan Blase in Treatment: 2 Subjective Chief Complaint Information obtained from Patient Left 3rd finger ulcer History of Present Illness (HPI) 10-24-2021 upon evaluation  today patient appears to be doing somewhat poorly in regard to a wound on her finger. This occurred after she was pulling on a hangnail. Subsequently this is something that seem to come up all of a sudden and has been extremely painful for her. Fortunately there does not appear to be any signs of active systemically though locally that is a different story. There is actually some eschar noted and it is extremely painful to touch she has been taking meloxicam which was helping to some degree. With that being said she has been on Keflex but that did not seem to help at all. The  patient was referred to our office for further evaluation and treatment. She does have a history of diabetes mellitus type 2 with hemoglobin A1c of 6.3 on 09-15-2021. 11-07-2021 upon evaluation today patient appears to be doing really a little bit worse in my opinion compared to last time I saw her. Fortunately I do not see any evidence of infection systemically but unfortunately she is still having what appears to be some infection locally. I do not think that there is any need for sending her to the ER or anything that drastic but I do believe switching her antibiotic and switch her over to [facility] as well should probably be the best way to go at this point. Objective Constitutional Well-nourished and well-hydrated in no acute distress. Vitals Time Taken: 12:46 PM, Height: 60 in, Weight: 148 lbs, BMI: 28.9, Temperature: 98.3 F, Pulse: 94 bpm, Respiratory Rate: 18 breaths/min, Blood Pressure: 173/94 mmHg. Respiratory normal breathing without difficulty. Psychiatric this patient is able to make decisions and demonstrates good insight into disease process. Alert and Oriented x 3. pleasant and cooperative. General Notes: Upon inspection patient's wound bed actually showed signs of good granulation and epithelization at this point. Fortunately I do not see any evidence of infection which is great news and overall I am very  pleased systemically though locally I am concerned about infection since she has completed the Bactrim and has not really improved dramatically I Minna recommend switching over to Levaquin to see if this can get better as it does cover for gram-negative's as well. Integumentary (Hair, Skin) Wound #1 status is Open. Original cause of wound was Gradually Appeared. The date acquired was: 08/04/2021. The wound has been in treatment 2 weeks. The wound is located on the Left,Distal Hand - 3rd Digit. The wound measures 1cm length x 0.7cm width x 0.1cm depth; 0.55cm^2 area and 0.055cm^3 volume. There is a none present amount of drainage noted. There is no granulation within the wound bed. There is a large (67- 100%) amount of necrotic tissue within the wound bed including Eschar. Assessment Active Problems ICD-10 Cellulitis of left finger Non-pressure chronic ulcer of skin of other sites with fat layer exposed S[place], Laurine ([id number]) Type 2 diabetes mellitus with other skin ulcer Essential (primary) hypertension Plan Follow-up Appointments: Return Appointment in 2 weeks. Bathing/ Shower/ Hygiene: May shower; gently cleanse wound with antibacterial soap, rinse and pat dry prior to dressing wounds The following medication(s) was prescribed: levofloxacin oral 500 mg tablet 1 1 tablet oral taken 1 time per day for 14 days. Do not take Pepcid Complete at the same time as this medication starting 11/07/2021 Santyl topical 250 unit/gram ointment ointment topical Apply nickel thick daily to the wound bed and then cover with a dressing as directed in clinic x 30 days starting 11/07/2021 WOUND #1: - Hand - 3rd Digit Wound Laterality: Left, Distal Topical: Santyl Collagenase Ointment, 30 (gm), tube 1 x Per Day/30 Days Discharge Instructions: apply nickel thick to wound bed only Primary Dressing: Xeroform 4x4-HBD (in/in) 1 x Per Day/30 Days Discharge Instructions: Apply Xeroform 4x4-HBD (in/in) as  directed Secondary Dressing: Coverlet Latex-Free Fabric Adhesive Dressings 1 x Per Day/30 Days Discharge Instructions: 1.5 x 2 1. I Ernie Hew suggest that we go ahead and continue with the recommendation for wound care measures as before and the patient is in agreement with plan this includes the use of the ointment although I will get a switch over to [facility] which I think will be a much better way to  go. 2. Also can recommend Xeroform gauze to cover to keep this moist. 3. I would also suggest that we have the patient continue with the coverlet to hold everything in place. 4. I am also going to recommend that we send in a prescription for Levaquin as a stronger antibiotic covering gram-negative's as well to see if this will improve the overall appearance of the wound bed. We will see patient back for reevaluation in 1 week here in the clinic. If anything worsens or changes patient will contact our office for additional recommendations. Electronic Signature(s) Signed: 11/07/2021 3:28:55 PM By: Lenda Kelp PA-C Entered By: Lenda Kelp on 11/07/2021 15:28:54 BHAVANA, KADY ([id number]) -------------------------------------------------------------------------------- SuperBill Details Patient Name: Olivia Ramos Date of Service: 11/07/2021 Medical Record Number: [id number] Patient Account Number: [id number] Date of Birth/Sex: [birth year] (65 y.o. F) Treating RN: Huel Coventry Primary Care Provider: Jenell Milliner Other Clinician: Betha Loa Referring Provider: Jenell Milliner Treating Provider/Extender: Rowan Blase in Treatment: 2 Diagnosis Coding ICD-10 Codes Code Description L03.012 Cellulitis of left finger L98.492 Non-pressure chronic ulcer of skin of other sites with fat layer exposed E11.622 Type 2 diabetes mellitus with other skin ulcer I10 Essential (primary) hypertension Facility Procedures CPT4 Code: [id number] Description: 99213 - WOUND CARE VISIT-LEV 3 EST  PT Modifier: Quantity: 1 Physician Procedures CPT4 Code: [id number] Description: 99214 - WC PHYS LEVEL 4 - EST PT Modifier: Quantity: 1 CPT4 Code: Description: ICD-10 Diagnosis Description L03.012 Cellulitis of left finger L98.492 Non-pressure chronic ulcer of skin of other sites with fat layer expos E11.622 Type 2 diabetes mellitus with other skin ulcer I10 Essential (primary) hypertension Modifier: ed Quantity: Electronic Signature(s) Signed: 11/07/2021 3:32:08 PM By: Lenda Kelp PA-C Entered By: Lenda Kelp on 11/07/2021 15:32:08

## 2021-11-10 NOTE — Progress Notes (Signed)
EVELYNN, MADERA ([id number]) Visit Report for 11/07/2021 Arrival Information Details Patient Name: LATRICA, HU Date of Service: 11/07/2021 12:30 PM Medical Record Number: [id number] Patient Account Number: [id number] Date of Birth/Sex: [birth year] (66 y.o. Female) Treating RN: Cornell Barman Primary Care Osmar Howton: Danae Orleans Other Clinician: Massie Kluver Referring Bristyl Mclees: Danae Orleans Treating Cullen Lahaie/Extender: Skipper Cliche in Treatment: 2 Visit Information History Since Last Visit All ordered tests and consults were completed: No Patient Arrived: Ambulatory Added or deleted any medications: No Arrival Time: 12:41 Any new allergies or adverse reactions: No Transfer Assistance: None Had a fall or experienced change in No Patient Requires Transmission-Based Precautions: No activities of daily living that may affect Patient Has Alerts: No risk of falls: Hospitalized since last visit: No Pain Present Now: Yes Notes Patient went to ER last Saturday 11/01/21 due to pain in finger. Was given Hydrocodone 5/325 mg Electronic Signature(s) Signed: 11/10/2021 4:15:07 PM By: Massie Kluver Entered By: Massie Kluver on 11/07/2021 12:45:15 Troy, Katharina ([id number]) -------------------------------------------------------------------------------- Clinic Level of Care Assessment Details Patient Name: Liz Beach Date of Service: 11/07/2021 12:30 PM Medical Record Number: [id number] Patient Account Number: [id number] Date of Birth/Sex: [birth year] (66 y.o. Female) Treating RN: Cornell Barman Primary Care Nasreen Goedecke: Danae Orleans Other Clinician: Massie Kluver Referring Leondro Coryell: Danae Orleans Treating Terriah Reggio/Extender: Skipper Cliche in Treatment: 2 Clinic Level of Care Assessment Items TOOL 4 Quantity Score []  - Use when only an EandM is performed on FOLLOW-UP visit 0 ASSESSMENTS - Nursing Assessment / Reassessment X - Reassessment of Co-morbidities (includes updates  in patient status) 1 10 X- 1 5 Reassessment of Adherence to Treatment Plan ASSESSMENTS - Wound and Skin Assessment / Reassessment X - Simple Wound Assessment / Reassessment - one wound 1 5 []  - 0 Complex Wound Assessment / Reassessment - multiple wounds []  - 0 Dermatologic / Skin Assessment (not related to wound area) ASSESSMENTS - Focused Assessment []  - Circumferential Edema Measurements - multi extremities 0 []  - 0 Nutritional Assessment / Counseling / Intervention []  - 0 Lower Extremity Assessment (monofilament, tuning fork, pulses) []  - 0 Peripheral Arterial Disease Assessment (using hand held doppler) ASSESSMENTS - Ostomy and/or Continence Assessment and Care []  - Incontinence Assessment and Management 0 []  - 0 Ostomy Care Assessment and Management (repouching, etc.) PROCESS - Coordination of Care X - Simple Patient / Family Education for ongoing care 1 15 []  - 0 Complex (extensive) Patient / Family Education for ongoing care []  - 0 Staff obtains Programmer, systems, Records, Test Results / Process Orders []  - 0 Staff telephones HHA, Nursing Homes / Clarify orders / etc []  - 0 Routine Transfer to another Facility (non-emergent condition) []  - 0 Routine Hospital Admission (non-emergent condition) []  - 0 New Admissions / Biomedical engineer / Ordering NPWT, Apligraf, etc. []  - 0 Emergency Hospital Admission (emergent condition) X- 1 10 Simple Discharge Coordination []  - 0 Complex (extensive) Discharge Coordination PROCESS - Special Needs []  - Pediatric / Minor Patient Management 0 []  - 0 Isolation Patient Management []  - 0 Hearing / Language / Visual special needs []  - 0 Assessment of Community assistance (transportation, D/C planning, etc.) []  - 0 Additional assistance / Altered mentation []  - 0 Support Surface(s) Assessment (bed, cushion, seat, etc.) INTERVENTIONS - Wound Cleansing / Measurement Landrus, Timika ([id number]) X- 1 5 Simple Wound Cleansing - one  wound []  - 0 Complex Wound Cleansing - multiple wounds X- 1 5 Wound Imaging (photographs - any number of wounds) []  - 0 Wound Tracing (instead of photographs) X- 1  5 Simple Wound Measurement - one wound []  - 0 Complex Wound Measurement - multiple wounds INTERVENTIONS - Wound Dressings []  - Small Wound Dressing one or multiple wounds 0 X- 1 15 Medium Wound Dressing one or multiple wounds []  - 0 Large Wound Dressing one or multiple wounds X- 1 5 Application of Medications - topical []  - 0 Application of Medications - injection INTERVENTIONS - Miscellaneous []  - External ear exam 0 []  - 0 Specimen Collection (cultures, biopsies, blood, body fluids, etc.) []  - 0 Specimen(s) / Culture(s) sent or taken to Lab for analysis []  - 0 Patient Transfer (multiple staff / / Similar devices) []  - 0 Simple Staple / Suture removal (25 or less) []  - 0 Complex Staple / Suture removal (26 or more) []  - 0 Hypo / Hyperglycemic Management (close monitor of Blood Glucose) []  - 0 Ankle / Brachial Index (ABI) - do not check if billed separately X- 1 5 Vital Signs Has the patient been seen at the hospital within the last three years: Yes Total Score: 85 Level Of Care: New/Established - Level 3 Electronic Signature(s) Signed: 11/10/2021 4:15:07 PM By: Entered By: on 11/07/2021 13:15:26 Brookens, Deana ( ) -------------------------------------------------------------------------------- Encounter Discharge Information Details Patient Name: Date of Service: 11/07/2021 12:30 PM Medical Record Number: Nurse, adult Patient Account Number: Date of Birth/Sex: 11-01-1955 (66 y.o. Female) Treating RN: Primary Care Jamilia Jacques: 01/10/2022 Other Clinician: Betha Loa Referring Aivy Akter: Betha Loa Treating Munachimso Palin/Extender: 01/07/2022 in Treatment: 2 Encounter Discharge Information Items Discharge  Condition: Stable Ambulatory Status: Ambulatory Discharge Destination: Home Transportation: Private Auto Accompanied By: son Schedule Follow-up Appointment: Yes Clinical Summary of Care: Electronic Signature(s) Signed: 11/10/2021 4:15:07 PM By: [name] Entered By: 01/07/2022 on 11/07/2021 13:18:06 Soler, Necie ([id number]) -------------------------------------------------------------------------------- Lower Extremity Assessment Details Patient Name: 02/22/1956 Date of Service: 11/07/2021 12:30 PM Medical Record Number: Huel Coventry Patient Account Number: Jenell Milliner Date of Birth/Sex: Sep 17, 1955 (65 y.o. Female) Treating RN: Rowan Blase Primary Care Tayen Narang: 01/10/2022 Other Clinician: Betha Loa Referring Brylinn Teaney: Betha Loa Treating Dunia Pringle/Extender: 01/07/2022 in Treatment: 2 Electronic Signature(s) Signed: 11/07/2021 3:39:08 PM By: [name] BSN, RN, CWS, Kim RN, BSN Signed: 11/10/2021 4:15:07 PM By: [id number] Entered By: [id number] on 11/07/2021 12:53:06 Pry, Kegan (07-11-2000) -------------------------------------------------------------------------------- Multi Wound Chart Details Patient Name: Huel Coventry Date of Service: 11/07/2021 12:30 PM Medical Record Number: Betha Loa Patient Account Number: Jenell Milliner Date of Birth/Sex: 1955/07/27 (65 y.o. Female) Treating RN: Elliot Gurney Primary Care Ruthanna Macchia: 01/10/2022 Other Clinician: Betha Loa Referring Nitin Mckowen: Betha Loa Treating Sylvestre Rathgeber/Extender: 01/07/2022 in Treatment: 2 Vital Signs Height(in): 60 Pulse(bpm): 94 Weight(lbs): 148 Blood Pressure(mmHg): 173/94 Body Mass Index(BMI): 28.9 Temperature(F): 98.3 Respiratory Rate(breaths/min): 18 Photos: [N/A:N/A] Wound Location: Left, Distal Hand - 3rd Digit N/A N/A Wounding Event: Gradually Appeared N/A N/A Primary Etiology: Infection - not elsewhere classified N/A N/A Comorbid History:  Asthma, Hypertension, Type II N/A N/A Diabetes Date Acquired: 08/04/2021 N/A N/A Weeks of Treatment: 2 N/A N/A Wound Status: Open N/A N/A Wound Recurrence: No N/A N/A Measurements L x W x D (cm) 1x0.7x0.1 N/A N/A Area (cm) : 0.55 N/A N/A Volume (cm) : 0.055 N/A N/A % Reduction in Area: -250.30% N/A N/A % Reduction in Volume: -243.70% N/A N/A Classification: Full Thickness Without Exposed N/A N/A Support Structures Exudate Amount: None Present N/A N/A Granulation Amount: None Present (0%) N/A N/A Necrotic Amount: Large (67-100%) N/A N/A Necrotic Tissue: Eschar N/A N/A Exposed  Structures: Fascia: No N/A N/A Fat Layer (Subcutaneous Tissue): No Tendon: No Muscle: No Joint: No Bone: No Epithelialization: None N/A N/A Treatment Notes Electronic Signature(s) Signed: 11/10/2021 4:15:07 PM By: Betha Loa Entered By: Betha Loa on 11/07/2021 12:53:17 Clugston, Martin ([id number]) -------------------------------------------------------------------------------- Multi-Disciplinary Care Plan Details Patient Name: [name] Date of Service: 11/07/2021 12:30 PM Medical Record Number: [id number] Patient Account Number: [id number] Date of Birth/Sex: [birth year] (65 y.o. Female) Treating RN: Huel Coventry Primary Care Rogue Pautler: Jenell Milliner Other Clinician: Betha Loa Referring Almon Whitford: Jenell Milliner Treating Camrie Stock/Extender: Rowan Blase in Treatment: 2 Active Inactive Wound/Skin Impairment Nursing Diagnoses: Knowledge deficit related to ulceration/compromised skin integrity Goals: Patient/caregiver will verbalize understanding of skin care regimen Date Initiated: 10/24/2021 Target Resolution Date: 11/24/2021 Goal Status: Active Ulcer/skin breakdown will have a volume reduction of 30% by week 4 Date Initiated: 10/24/2021 Target Resolution Date: 11/24/2021 Goal Status: Active Ulcer/skin breakdown will have a volume reduction of 50% by week 8 Date Initiated:  10/24/2021 Target Resolution Date: 12/25/2021 Goal Status: Active Ulcer/skin breakdown will have a volume reduction of 80% by week 12 Date Initiated: 10/24/2021 Target Resolution Date: 01/24/2022 Goal Status: Active Ulcer/skin breakdown will heal within 14 weeks Date Initiated: 10/24/2021 Target Resolution Date: 02/24/2022 Goal Status: Active Interventions: Assess patient/caregiver ability to obtain necessary supplies Assess patient/caregiver ability to perform ulcer/skin care regimen upon admission and as needed Assess ulceration(s) every visit Notes: Electronic Signature(s) Signed: 11/07/2021 3:39:08 PM By: Elliot Gurney, BSN, RN, CWS, Kim RN, BSN Signed: 11/10/2021 4:15:07 PM By: Betha Loa Entered By: Betha Loa on 11/07/2021 12:53:09 Robeck, Mirabella ([id number]) -------------------------------------------------------------------------------- Pain Assessment Details Patient Name: [name] Date of Service: 11/07/2021 12:30 PM Medical Record Number: [id number] Patient Account Number: [id number] Date of Birth/Sex: 1955-12-28 (65 y.o. Female) Treating RN: Huel Coventry Primary Care Shone Leventhal: Jenell Milliner Other Clinician: Betha Loa Referring Delta Deshmukh: Jenell Milliner Treating Josalyn Dettmann/Extender: Rowan Blase in Treatment: 2 Active Problems Location of Pain Severity and Description of Pain Patient Has Paino Yes Site Locations Pain Location: Pain in Ulcers Duration of the Pain. Constant / Intermittento Constant Rate the pain. Current Pain Level: 8 Character of Pain Describe the Pain: Burning Pain Management and Medication Current Pain Management: Medication: Yes Rest: Yes Electronic Signature(s) Signed: 11/07/2021 3:39:08 PM By: Elliot Gurney, BSN, RN, CWS, Kim RN, BSN Signed: 11/10/2021 4:15:07 PM By: Betha Loa Entered By: Betha Loa on 11/07/2021 12:48:36 [place], Trinette  ([id number]) -------------------------------------------------------------------------------- Patient/Caregiver Education Details Patient Name: [name] Date of Service: 11/07/2021 12:30 PM Medical Record Number: [id number] Patient Account Number: [id number] Date of Birth/Gender: 06/05/1955 (65 y.o. Female) Treating RN: Huel Coventry Primary Care Physician: Jenell Milliner Other Clinician: Betha Loa Referring Physician: Jenell Milliner Treating Physician/Extender: Rowan Blase in Treatment: 2 Education Assessment Education Provided To: Patient Education Topics Provided Wound/Skin Impairment: Handouts: Other: continue wound care as directed Electronic Signature(s) Signed: 11/10/2021 4:15:07 PM By: Betha Loa Entered By: Betha Loa on 11/07/2021 13:15:48 Colclough, Kaley ([id number]) -------------------------------------------------------------------------------- Wound Assessment Details Patient Name: [name] Date of Service: 11/07/2021 12:30 PM Medical Record Number: [id number] Patient Account Number: [id number] Date of Birth/Sex: Jan 14, 1956 (65 y.o. Female) Treating RN: Huel Coventry Primary Care Makih Stefanko: Jenell Milliner Other Clinician: Betha Loa Referring Harmonie Verrastro: Jenell Milliner Treating Letroy Vazguez/Extender: Rowan Blase in Treatment: 2 Wound Status Wound Number: 1 Primary Etiology: Infection - not elsewhere classified Wound Location: Left, Distal Hand - 3rd Digit Wound Status: Open Wounding Event: Gradually Appeared Comorbid History: Asthma, Hypertension, Type II Diabetes Date Acquired: 08/04/2021 Weeks Of Treatment: 2 Clustered Wound: No  Photos Wound Measurements Length: (cm) 1 Width: (cm) 0.7 Depth: (cm) 0.1 Area: (cm) 0.55 Volume: (cm) 0.055 % Reduction in Area: -250.3% % Reduction in Volume: -243.7% Epithelialization: None Wound Description Classification: Full Thickness Without Exposed Support Structures Exudate Amount:  None Present Foul Odor After Cleansing: No Slough/Fibrino Yes Wound Bed Granulation Amount: None Present (0%) Exposed Structure Necrotic Amount: Large (67-100%) Fascia Exposed: No Necrotic Quality: Eschar Fat Layer (Subcutaneous Tissue) Exposed: No Tendon Exposed: No Muscle Exposed: No Joint Exposed: No Bone Exposed: No Treatment Notes Wound #1 (Hand - 3rd Digit) Wound Laterality: Left, Distal Cleanser Peri-Wound Care Topical Santyl Collagenase Ointment, 30 (gm), tube Discharge Instruction: apply nickel thick to wound bed only Carrigg, Yasmyn ([id number]) Primary Dressing Xeroform 4x4-HBD (in/in) Discharge Instruction: Apply Xeroform 4x4-HBD (in/in) as directed Secondary Dressing Coverlet Latex-Free Fabric Adhesive Dressings Discharge Instruction: 1.5 x 2 Secured With Compression Wrap Compression Stockings Add-Ons Electronic Signature(s) Signed: 11/07/2021 3:39:08 PM By: Gretta Cool, BSN, RN, CWS, Kim RN, BSN Signed: 11/10/2021 4:15:07 PM By: Massie Kluver Entered By: Massie Kluver on 11/07/2021 12:52:55 Flaugher, Pattijo ([id number]) -------------------------------------------------------------------------------- [place] Details Patient Name: Liz Beach Date of Service: 11/07/2021 12:30 PM Medical Record Number: [id number] Patient Account Number: [id number] Date of Birth/Sex: 1956-01-29 (65 y.o. Female) Treating RN: Cornell Barman Primary Care Nyella Eckels: Danae Orleans Other Clinician: Massie Kluver Referring Lynwood Kubisiak: Danae Orleans Treating Kasai Beltran/Extender: Skipper Cliche in Treatment: 2 Vital Signs Time Taken: 12:46 Temperature (F): 98.3 Height (in): 60 Pulse (bpm): 94 Weight (lbs): 148 Respiratory Rate (breaths/min): 18 Body Mass Index (BMI): 28.9 Blood Pressure (mmHg): 173/94 Reference Range: 80 - 120 mg / dl Electronic Signature(s) Signed: 11/10/2021 4:15:07 PM By: Massie Kluver Entered By: Massie Kluver on 11/07/2021 12:48:17

## 2021-11-14 ENCOUNTER — Encounter: Payer: BC Managed Care – PPO | Admitting: Physician Assistant

## 2021-11-14 DIAGNOSIS — E11622 Type 2 diabetes mellitus with other skin ulcer: Secondary | ICD-10-CM | POA: Diagnosis not present

## 2021-11-14 NOTE — Progress Notes (Addendum)
PATRICIANN, Ramos ([id number]) Visit Report for 11/14/2021 Chief Complaint Document Details Patient Name: Olivia Ramos, Olivia Ramos Date of Service: 11/14/2021 12:45 PM Medical Record Number: [id number] Patient Account Number: [id number] Date of Birth/Sex: 12-20-1955 (66 y.o. F) Treating RN: Huel Coventry Primary Care Provider: Jenell Milliner Other Clinician: Izetta Dakin Referring Provider: Jenell Milliner Treating Provider/Extender: Rowan Blase in Treatment: 3 Information Obtained from: Patient Chief Complaint Left 3rd finger ulcer Electronic Signature(s) Signed: 11/14/2021 1:00:37 PM By: Lenda Kelp PA-C Entered By: Lenda Kelp on 11/14/2021 13:00:36 Olivia Ramos ([id number]) -------------------------------------------------------------------------------- HPI Details Patient Name: Olivia Ramos Date of Service: 11/14/2021 12:45 PM Medical Record Number: [id number] Patient Account Number: [id number] Date of Birth/Sex: 05/16/1955 (66 y.o. F) Treating RN: Huel Coventry Primary Care Provider: Jenell Milliner Other Clinician: Izetta Dakin Referring Provider: Jenell Milliner Treating Provider/Extender: Rowan Blase in Treatment: 3 History of Present Illness HPI Description: 10-24-2021 upon evaluation today patient appears to be doing somewhat poorly in regard to a wound on her finger. This occurred after she was pulling on a hangnail. Subsequently this is something that seem to come up all of a sudden and has been extremely painful for her. Fortunately there does not appear to be any signs of active systemically though locally that is a different story. There is actually some eschar noted and it is extremely painful to touch she has been taking meloxicam which was helping to some degree. With that being said she has been on Keflex but that did not seem to help at all. The patient was referred to our office for further evaluation and treatment. She does have a history of  diabetes mellitus type 2 with hemoglobin A1c of 6.3 on 09-15-2021. 11-07-2021 upon evaluation today patient appears to be doing really a little bit worse in my opinion compared to last time I saw her. Fortunately I do not see any evidence of infection systemically but unfortunately she is still having what appears to be some infection locally. I do not think that there is any need for sending her to the ER or anything that drastic but I do believe switching her antibiotic and switch her over to [facility] as well should probably be the best way to go at this point. 11-14-2021 upon evaluation today patient appears to be still having quite a bit of an issue with her finger at this point. Fortunately I do not see any signs of infection currently which is good news. With that being said unfortunately I do think she still having a lot of pain. I do not feel like there is any worsening overall and in fact I feel like the redness is improved to some degree compared to what it was. She is still on the Levaquin which I put her on for 14 days starting last week. I also did actually go ahead and give her a prescription as well for Santyl but we have been having a hard time getting this approved. In the end it was denied by insurance. I gave her the co-pay card today so this should be only $80. She has had multiple x-rays of the hand which show no signs of osteomyelitis. She continues to have significant issues with pain and would like a referral to pain management. Unfortunately there is anyone in [place] who does pain management I am wondering if we can possibly see if Dr. Vear Clock in [facility] would be willing to take her on for a short amount of time while we get the wound healed  to try to help with the discomfort as this is continued to be an ongoing issue but unfortunately we do not prescribe any pain medications out of the wound care center. Electronic Signature(s) Signed: 11/14/2021 1:36:08 PM  By: Lenda Kelp PA-C Entered By: Lenda Kelp on 11/14/2021 13:36:08 ZEMIRA, ZEHRING ([id number]) -------------------------------------------------------------------------------- Physical Exam Details Patient Name: Olivia Ramos Date of Service: 11/14/2021 12:45 PM Medical Record Number: [id number] Patient Account Number: [id number] Date of Birth/Sex: 29-Aug-1955 (66 y.o. F) Treating RN: Huel Coventry Primary Care Provider: Jenell Milliner Other Clinician: Izetta Dakin Referring Provider: Jenell Milliner Treating Provider/Extender: Rowan Blase in Treatment: 3 Constitutional Well-nourished and well-hydrated in no acute distress. Respiratory normal breathing without difficulty. Psychiatric this patient is able to make decisions and demonstrates good insight into disease process. Alert and Oriented x 3. pleasant and cooperative. Notes Upon inspection patient still has significant necrotic tissue and eschar over the tip of the finger this has become especially tender and we will try to get it cleaned off using Santyl as sharp debridement is very touchy for her right now. She is having a lot of issues with discomfort and being that we do not prescribe any medications out of the wound care center I am going to see about making referral to pain management for her. Electronic Signature(s) Signed: 11/14/2021 1:37:07 PM By: Lenda Kelp PA-C Entered By: Lenda Kelp on 11/14/2021 13:37:06 REBBECCA, OSUNA ([id number]) -------------------------------------------------------------------------------- Physician Orders Details Patient Name: Olivia Ramos Date of Service: 11/14/2021 12:45 PM Medical Record Number: [id number] Patient Account Number: [id number] Date of Birth/Sex: 25-Sep-1955 (66 y.o. F) Treating RN: Huel Coventry Primary Care Provider: Jenell Milliner Other Clinician: Izetta Dakin Referring Provider: Jenell Milliner Treating Provider/Extender: Rowan Blase  in Treatment: 3 Verbal / Phone Orders: No Diagnosis Coding ICD-10 Coding Code Description L03.012 Cellulitis of left finger L98.492 Non-pressure chronic ulcer of skin of other sites with fat layer exposed E11.622 Type 2 diabetes mellitus with other skin ulcer I10 Essential (primary) hypertension Follow-up Appointments o Return Appointment in 2 weeks. Bathing/ Shower/ Hygiene o May shower; gently cleanse wound with antibacterial soap, rinse and pat dry prior to dressing wounds Medications-Please add to medication list. o Take one 500mg  Tylenol (Acetaminophen) and one 200mg  Motrin (Ibuprofen) every 6 hours for pain. Do not take ibuprofen if you are on blood thinners or have stomach ulcers. - Tylenol for pain o P.O. Antibiotics - Continue Levaquin until finished o Santyl Enzymatic Ointment - apply small amount to wound bed, then cover with a small piece of yellow Xeroform, then cover with a bandaid only change dressing once daily Wound Treatment Wound #1 - Hand - 3rd Digit Wound Laterality: Left, Distal Topical: Santyl Collagenase Ointment, 30 (gm), tube 1 x Per Day/30 Days Discharge Instructions: apply nickel thick to wound bed only Primary Dressing: Xeroform 4x4-HBD (in/in) 1 x Per Day/30 Days Discharge Instructions: Apply Xeroform 4x4-HBD (in/in) as directed Secondary Dressing: Coverlet Latex-Free Fabric Adhesive Dressings 1 x Per Day/30 Days Discharge Instructions: 1.5 x 2 Consults o Pain Clinic - Refer to Dr. in [place] for short term pain management until wound is healed Electronic Signature(s) Signed: 11/14/2021 4:28:06 PM By: [place] Signed: 11/14/2021 5:04:29 PM By: Betha Loa PA-C Entered By: 01/14/2022 on 11/14/2021 13:30:47 Chandler, Raenell (01/14/2022) -------------------------------------------------------------------------------- Problem List Details Patient Name: [place] Date of Service: 11/14/2021 12:45 PM Medical Record  Number: Olivia Ramos Patient Account Number: 01/14/2022 Date of Birth/Sex: 1955/05/18 (65 y.o. F) Treating RN: 02/22/1956 Primary  Care Provider: Danae Orleans Other Clinician: Jacqulyn Bath Referring Provider: Danae Orleans Treating Provider/Extender: Skipper Cliche in Treatment: 3 Active Problems ICD-10 Encounter Code Description Active Date MDM Diagnosis L03.012 Cellulitis of left finger 10/24/2021 No Yes L98.492 Non-pressure chronic ulcer of skin of other sites with fat layer exposed 10/24/2021 No Yes E11.622 Type 2 diabetes mellitus with other skin ulcer 10/24/2021 No Yes I10 Essential (primary) hypertension 10/24/2021 No Yes Inactive Problems Resolved Problems Electronic Signature(s) Signed: 11/14/2021 1:00:32 PM By: Worthy Keeler PA-C Entered By: Worthy Keeler on 11/14/2021 13:00:32 [place], Verdis Frederickson ([id number]) -------------------------------------------------------------------------------- Progress Note Details Patient Name: Liz Beach Date of Service: 11/14/2021 12:45 PM Medical Record Number: [id number] Patient Account Number: [id number] Date of Birth/Sex: 1955-09-21 (65 y.o. F) Treating RN: Cornell Barman Primary Care Provider: Danae Orleans Other Clinician: Jacqulyn Bath Referring Provider: Danae Orleans Treating Provider/Extender: Skipper Cliche in Treatment: 3 Subjective Chief Complaint Information obtained from Patient Left 3rd finger ulcer History of Present Illness (HPI) 10-24-2021 upon evaluation today patient appears to be doing somewhat poorly in regard to a wound on her finger. This occurred after she was pulling on a hangnail. Subsequently this is something that seem to come up all of a sudden and has been extremely painful for her. Fortunately there does not appear to be any signs of active systemically though locally that is a different story. There is actually some eschar noted and it is extremely painful to touch she has been taking  meloxicam which was helping to some degree. With that being said she has been on Keflex but that did not seem to help at all. The patient was referred to our office for further evaluation and treatment. She does have a history of diabetes mellitus type 2 with hemoglobin A1c of 6.3 on 09-15-2021. 11-07-2021 upon evaluation today patient appears to be doing really a little bit worse in my opinion compared to last time I saw her. Fortunately I do not see any evidence of infection systemically but unfortunately she is still having what appears to be some infection locally. I do not think that there is any need for sending her to the ER or anything that drastic but I do believe switching her antibiotic and switch her over to [facility] as well should probably be the best way to go at this point. 11-14-2021 upon evaluation today patient appears to be still having quite a bit of an issue with her finger at this point. Fortunately I do not see any signs of infection currently which is good news. With that being said unfortunately I do think she still having a lot of pain. I do not feel like there is any worsening overall and in fact I feel like the redness is improved to some degree compared to what it was. She is still on the Levaquin which I put her on for 14 days starting last week. I also did actually go ahead and give her a prescription as well for Santyl but we have been having a hard time getting this approved. In the end it was denied by insurance. I gave her the co-pay card today so this should be only $80. She has had multiple x-rays of the hand which show no signs of osteomyelitis. She continues to have significant issues with pain and would like a referral to pain management. Unfortunately there is anyone in [place] who does pain management I am wondering if we can possibly see if Dr. Hardin Negus in [facility] would be  willing to take her on for a short amount of time while we get the wound  healed to try to help with the discomfort as this is continued to be an ongoing issue but unfortunately we do not prescribe any pain medications out of the wound care center. Objective Constitutional Well-nourished and well-hydrated in no acute distress. Vitals Time Taken: 12:57 PM, Height: 60 in, Weight: 148 lbs, BMI: 28.9, Temperature: 98.1 F, Pulse: 98 bpm, Respiratory Rate: 18 breaths/min, Blood Pressure: 166/90 mmHg. Respiratory normal breathing without difficulty. Psychiatric this patient is able to make decisions and demonstrates good insight into disease process. Alert and Oriented x 3. pleasant and cooperative. General Notes: Upon inspection patient still has significant necrotic tissue and eschar over the tip of the finger this has become especially tender and we will try to get it cleaned off using Santyl as sharp debridement is very touchy for her right now. She is having a lot of issues with discomfort and being that we do not prescribe any medications out of the wound care center I am going to see about making referral to pain management for her. Integumentary (Hair, Skin) Wound #1 status is Open. Original cause of wound was Gradually Appeared. The date acquired was: 08/04/2021. The wound has been in treatment 3 weeks. The wound is located on the Left,Distal Hand - 3rd Digit. The wound measures 1.1cm length x 0.8cm width x 0.1cm depth; 0.691cm^2 area and 0.069cm^3 volume. There is a none present amount of drainage noted. There is no granulation within the wound bed. There is a large (67-100%) amount of necrotic tissue within the wound bed including Eschar. JANELIE, BECKSTEAD ([id number]) Assessment Active Problems ICD-10 Cellulitis of left finger Non-pressure chronic ulcer of skin of other sites with fat layer exposed Type 2 diabetes mellitus with other skin ulcer Essential (primary) hypertension Plan Follow-up Appointments: Return Appointment in 2 weeks. Bathing/ Shower/  Hygiene: May shower; gently cleanse wound with antibacterial soap, rinse and pat dry prior to dressing wounds Medications-Please add to medication list.: Take one 500mg  Tylenol (Acetaminophen) and one 200mg  Motrin (Ibuprofen) every 6 hours for pain. Do not take ibuprofen if you are on blood thinners or have stomach ulcers. - Tylenol for pain P.O. Antibiotics - Continue Levaquin until finished Santyl Enzymatic Ointment - apply small amount to wound bed, then cover with a small piece of yellow Xeroform, then cover with a bandaid only change dressing once daily Consults ordered were: Pain Clinic - Refer to Dr. Doren Custard in [place] for short term pain management until wound is healed WOUND #1: - Hand - 3rd Digit Wound Laterality: Left, Distal Topical: Santyl Collagenase Ointment, 30 (gm), tube 1 x Per Day/30 Days Discharge Instructions: apply nickel thick to wound bed only Primary Dressing: Xeroform 4x4-HBD (in/in) 1 x Per Day/30 Days Discharge Instructions: Apply Xeroform 4x4-HBD (in/in) as directed Secondary Dressing: Coverlet Latex-Free Fabric Adhesive Dressings 1 x Per Day/30 Days Discharge Instructions: 1.5 x 2 1. Based on what I am seeing currently I am going to see about making referral to pain management with Dr. Hardin Negus in [facility]. He is an excellent physician and I am hopeful considering that this will be just a short amount of time that she is requiring pain management for the acute issue with her finger that he will be willing to take her on as far as a patient is concerned. I know she is having a lot of issues here with discomfort and I am trying to do everything I  can but that is something that would be potentially beneficial for her as well. We had done meloxicam my concern is continuing that is a risk with her other medications and chronic medical problems. Therefore she has been doing Tylenol and try to use topical lidocaine but this just really is still  causing a lot of discomfort for her. 2. Also can recommend at this time that we have the patient continue with the Santyl she is good to get this through the co-pay card and subsequently she will apply the Xeroform on top of this and if it still drying out she can use a saline moistened gauze on top of that. 3. I am also can recommend that she continue with the Band-Aid to hold everything in place which does seem to be doing decently well. Again the goal is to try to keep this from drying out which I do it hurts worse when it does. We will see patient back for reevaluation in 1 week here in the clinic. If anything worsens or changes patient will contact our office for additional recommendations. Electronic Signature(s) Signed: 11/14/2021 1:38:29 PM By: Worthy Keeler PA-C Entered By: Worthy Keeler on 11/14/2021 13:38:29 [place], Verdis Frederickson ([id number]) -------------------------------------------------------------------------------- SuperBill Details Patient Name: Liz Beach Date of Service: 11/14/2021 Medical Record Number: [id number] Patient Account Number: [id number] Date of Birth/Sex: 19-Mar-1956 (65 y.o. F) Treating RN: Cornell Barman Primary Care Provider: Danae Orleans Other Clinician: Jacqulyn Bath Referring Provider: Danae Orleans Treating Provider/Extender: Skipper Cliche in Treatment: 3 Diagnosis Coding ICD-10 Codes Code Description L03.012 Cellulitis of left finger L98.492 Non-pressure chronic ulcer of skin of other sites with fat layer exposed E11.622 Type 2 diabetes mellitus with other skin ulcer I10 Essential (primary) hypertension Facility Procedures CPT4 Code: [id number] Description: 99213 - WOUND CARE VISIT-LEV 3 EST PT Modifier: Quantity: 1 Physician Procedures CPT4 Code: [id number] Description: [id number] - WC PHYS LEVEL 4 - EST PT Modifier: Quantity: 1 CPT4 Code: Description: ICD-10 Diagnosis Description L03.012 Cellulitis of left finger L98.492 Non-pressure  chronic ulcer of skin of other sites with fat layer expos E11.622 Type 2 diabetes mellitus with other skin ulcer I10 Essential (primary) hypertension Modifier: ed Quantity: Electronic Signature(s) Signed: 11/14/2021 1:38:48 PM By: Worthy Keeler PA-C Entered By: Worthy Keeler on 11/14/2021 13:38:48

## 2021-11-15 ENCOUNTER — Ambulatory Visit (INDEPENDENT_AMBULATORY_CARE_PROVIDER_SITE_OTHER): Payer: BC Managed Care – PPO

## 2021-11-15 ENCOUNTER — Ambulatory Visit
Admission: EM | Admit: 2021-11-15 | Discharge: 2021-11-15 | Disposition: A | Discharge status: Home / Self Care | Payer: BC Managed Care – PPO | Attending: Family Medicine | Admitting: Family Medicine

## 2021-11-15 DIAGNOSIS — M79645 Pain in left finger(s): Secondary | ICD-10-CM | POA: Diagnosis not present

## 2021-11-15 DIAGNOSIS — R234 Changes in skin texture: Secondary | ICD-10-CM

## 2021-11-15 LAB — CBC WITH DIFFERENTIAL/PLATELET
Abs Immature Granulocytes: 0.02 10*3/uL (ref 0.00–0.07)
Basophils Absolute: 0.1 10*3/uL (ref 0.0–0.1)
Basophils Relative: 1 %
Eosinophils Absolute: 0.3 10*3/uL (ref 0.0–0.5)
Eosinophils Relative: 4 %
HCT: 36.4 % (ref 36.0–46.0)
Hemoglobin: 11.9 g/dL — ABNORMAL LOW (ref 12.0–15.0)
Immature Granulocytes: 0 %
Lymphocytes Relative: 32 %
Lymphs Abs: 2 10*3/uL (ref 0.7–4.0)
MCH: 26.4 pg (ref 26.0–34.0)
MCHC: 32.7 g/dL (ref 30.0–36.0)
MCV: 80.7 fL (ref 80.0–100.0)
Monocytes Absolute: 0.5 10*3/uL (ref 0.1–1.0)
Monocytes Relative: 8 %
Neutro Abs: 3.4 10*3/uL (ref 1.7–7.7)
Neutrophils Relative %: 55 %
Platelets: 344 10*3/uL (ref 150–400)
RBC: 4.51 MIL/uL (ref 3.87–5.11)
RDW: 16.4 % — ABNORMAL HIGH (ref 11.5–15.5)
WBC: 6.1 10*3/uL (ref 4.0–10.5)
nRBC: 0 % (ref 0.0–0.2)

## 2021-11-15 MED ORDER — TRAMADOL HCL 50 MG PO TABS: 50.0000 mg | ORAL_TABLET | Freq: Four times a day (QID) | ORAL | 0 refills | Status: DC | PRN

## 2021-11-15 MED ORDER — KETOROLAC TROMETHAMINE 60 MG/2ML IM SOLN
30.0000 mg | Freq: Once | INTRAMUSCULAR | Status: AC
Start: 2021-11-15 — End: 2021-11-15
  Administered 2021-11-15: 30 mg via INTRAMUSCULAR

## 2021-11-15 NOTE — ED Provider Notes (Signed)
[facility]    CSN: [id number] Arrival date & time: 11/15/21  1053      History   Chief Complaint Chief Complaint  Patient presents with   Finger Pain    HPI Kerline Trahan is a 66 y.o. female.   HPI  A Spanish interpreter was used for this encounter.   Dalonda presents for left index finger injury.  She injured her finger after pulling a hangnail in April.  She has been seeing wound care for this.  She has been taking Tylenol without relief.  Said the pain acutely worsened yesterday after her Santyl treatment dried.  She previously took Keflex and is currently taking Levaquin for presumed cellulitis. No fever, nausea or vomiting.  She does endorse some hand pain with decreased motion in her index finger due to swelling, redness and pain.   Past Medical History:  Diagnosis Date   Arthritis    Asthma    Diabetes mellitus without complication ([place])    Hypertension     Patient Active Problem List   Diagnosis Date Noted   Mixed hyperlipidemia 08/03/2017   Background diabetic retinopathy ([place]) 07/01/2017   Carpal tunnel syndrome of left wrist 05/31/2017   Onychomycosis 05/17/2017   Osteoarthritis, hand 04/16/2017   Diabetic neuropathy ([place]) 08/21/2016   Constipation 07/02/2016   Asthma 07/02/2016   GERD (gastroesophageal reflux disease) 02/18/2015   History of colon polyps 02/18/2015   Osteoarthritis of both feet 02/18/2015   Hyperlipidemia 11/26/2014   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      Home Medications    Prior to Admission medications   Medication Sig Start Date End Date Taking? Authorizing Provider  albuterol (VENTOLIN HFA) 108 (90 Base) MCG/ACT inhaler Inhale 2 puffs into the lungs every 4  (four) hours as needed for wheezing or shortness of breath. 11/14/19  Yes Lamptey, Myrene Galas, MD  atorvastatin (LIPITOR) 80 MG tablet Take 1 tablet (80 mg total) by mouth daily. 04/04/18  Yes Juline Patch, MD  atorvastatin (LIPITOR) 80 MG tablet Take by mouth. 05/22/21 05/22/22 Yes [provider]  Dulaglutide (TRULICITY) 1.5 MV/7.8IO SOPN Inject into the skin. 09/16/21  Yes [provider]  empagliflozin (JARDIANCE) 10 MG TABS tablet Take 1 tablet by mouth daily. 09/12/21  Yes [provider]  famotidine-calcium carbonate-magnesium hydroxide (PEPCID COMPLETE) 10-800-165 MG chewable tablet Chew 1 tablet by mouth daily as needed.   Yes [provider]  Fluticasone-Salmeterol (ADVAIR) 250-50 MCG/DOSE AEPB Inhale 1 puff into the lungs 2 (two) times daily. 04/04/18  Yes Otilio Miu C, MD  glucose blood (FREESTYLE LITE) test strip Use as instructed 04/04/18  Yes Juline Patch, MD  glucose monitoring kit (FREESTYLE) monitoring kit 1 each by Does not apply route daily. Check glucose once in the morning before breakfast and 1 hour after a meal 04/04/18  Yes Juline Patch, MD  Insulin Glargine (BASAGLAR KWIKPEN) 100 UNIT/ML SOPN INJECT 0.35 MLS (35 UNITS TOTAL) INTO THE SKIN DAILY. 09/20/18  Yes Juline Patch, MD  Insulin Pen Needle (PEN NEEDLES) 31G X 8 MM MISC Use as directed 04/04/18  Yes Juline Patch, MD  Lancets (FREESTYLE) lancets Use as instructed 04/04/18  Yes Juline Patch,  MD  losartan (COZAAR) 50 MG tablet Take 1 tablet (50 mg total) by mouth daily. 04/04/18  Yes Juline Patch, MD  losartan (COZAAR) 50 MG tablet Take 1 tablet by mouth daily. 08/29/21  Yes [provider]  meloxicam (MOBIC) 15 MG tablet Take 15 mg by mouth daily. 10/24/21  Yes [provider]  metFORMIN (GLUCOPHAGE) 1000 MG tablet TAKE 1 TABLET (1,000 MG TOTAL) BY MOUTH 2 (TWO) TIMES DAILY WITH A MEAL. 10/03/18  Yes Juline Patch, MD  metFORMIN (GLUCOPHAGE) 1000 MG  tablet Take by mouth. 06/17/21 06/18/22 Yes [provider]  mupirocin ointment (BACTROBAN) 2 % Apply topically daily. 10/24/21  Yes [provider]  sulfamethoxazole-trimethoprim (BACTRIM DS) 800-160 MG tablet Take 1 tablet by mouth 2 (two) times daily. 10/24/21  Yes [provider]  traMADol (ULTRAM) 50 MG tablet Take 1 tablet (50 mg total) by mouth every 6 (six) hours as needed. 11/15/21  Yes Tiernan Millikin, Ronnette Juniper, DO  acetaminophen (TYLENOL) 325 MG tablet Take by mouth.    [provider]  benzonatate (TESSALON) 100 MG capsule Take 1 capsule (100 mg total) by mouth every 8 (eight) hours. 11/14/19   Lamptey, Myrene Galas, MD  levofloxacin (LEVAQUIN) 500 MG tablet Take 500 mg by mouth daily. 11/07/21   [provider]    Family History Family History  Problem Relation Age of Onset   Heart attack Mother    Diabetes Mother    Hypertension Father    Cancer Sister        breast   Heart attack Brother    Stroke Sister    Breast cancer Neg Hx     Social History Social History   Tobacco Use   Smoking status: Never   Smokeless tobacco: Never  Vaping Use   Vaping Use: Never used  Substance Use Topics   Alcohol use: No    Alcohol/week: 0.0 standard drinks of alcohol   Drug use: No     Allergies   Soy allergy   Review of Systems Review of Systems : :negative unless otherwise stated in HPI.      Physical Exam Triage Vital Signs ED Triage Vitals [11/15/21 1108]  Enc Vitals Group     BP      Pulse      Resp      Temp      Temp src      SpO2      Weight 145 lb (65.8 kg)     Height _0  (1.549 m)     Head Circumference      Peak Flow      Pain Score 10     Pain Loc      Pain Edu?      Excl. in [place]?    No data found.  Updated Vital Signs BP (!) 157/79 (BP Location: Right Arm)   Pulse 96   Temp 98.3 F (36.8 C) (Oral)   Ht _1  (1.549 m)   Wt 65.8 kg   SpO2 100%   BMI 27.40 kg/m   Visual Acuity Right Eye Distance:   Left Eye  Distance:   Bilateral Distance:    Right Eye Near:   Left Eye Near:    Bilateral Near:     Physical Exam  GEN: pleasant non-ill appearing female holding her finger CV: regular rate  RESP: no increased work of breathing MSK: normal ROM of 1-3 and 5th digits on the left, decreased ROM due to pain  of 4th digit on the left  SKIN: warm, dry, eschar, erythema and swelling present on 4th left digit (see image below)     UC Treatments / Results  Labs (all labs ordered are listed, but only abnormal results are displayed) Labs Reviewed  CBC WITH DIFFERENTIAL/PLATELET - Abnormal; Notable for the following components:      Result Value   Hemoglobin 11.9 (*)    RDW 16.4 (*)    All other components within normal limits    EKG   Radiology DG Finger Ring Left  Result Date: 11/15/2021 CLINICAL DATA:  Injury.  Infection.  Pain. EXAM: LEFT RING FINGER 2+V COMPARISON:  None Available. FINDINGS: Soft tissue swelling in the distal third finger. No soft tissue gas identified. No convincing evidence of osteomyelitis. Vascular calcifications. Degenerative changes in the distal interphalangeal joint. IMPRESSION: Soft tissue swelling. No soft tissue gas or osteomyelitis identified. Electronically Signed   By: Dorise Bullion III M.D.   On: 11/15/2021 12:05    Procedures Procedures (including critical care time)  Medications Ordered in UC Medications  ketorolac (TORADOL) injection 30 mg (30 mg Intramuscular Given 11/15/21 1143)    Initial Impression / Assessment and Plan / UC Course  I have reviewed the triage vital signs and the nursing notes.  Pertinent labs & imaging results that were available during my care of the patient were reviewed by me and considered in my medical decision making (see chart for details).     Patient is a 66 year old female with history of type 2 diabetes (last a1c 6.3 in June) who presents for left middle finger pain after injury which occurred in April.  She has  been seeing by Mr. Joaquim Lai, [place] for wound care since mid July.  She last saw Mr. Joaquim Lai, [place] yesterday in the office.  Office note reviewed from 11/14/2021.  She was recently switched to Levaquin and Keflex.  Pain has not really gotten much better for her.  Care wanted her to have Santyl but her co-pay is around $80.     Due to her ongoing pain, there are thoughts that she would be seen by pain management, possibly Dr. Hardin Negus in [place].  For wound care, she has had several x-rays that did not show osteomyelitis however her last x-ray was on 11/01/2021.  Given her acute pain I think it is warranted to repeat this imaging today.  Given Toradol for pain control. Recent serum creatine 0.98 in Jan 2023.   Xray today did not show evidence of osteomyelitis or gas in the tissues. Ultimately she may need an MRI or CT for a definitive answer which is not available here today. She is taking Levaquin. CBC without evidence of  leukocytosis.   We will give her a short course of tramadol to help with pain control.  Advised to follow-up with her wound care provider as soon as possible.  Discussed MDM, treatment plan and plan for follow-up with patient/parent who agrees with plan.    Final diagnoses:  Eschar of finger  Finger pain, left   Discharge Instructions   None    ED Prescriptions     Medication Sig Dispense Auth. Provider   traMADol (ULTRAM) 50 MG tablet Take 1 tablet (50 mg total) by mouth every 6 (six) hours as needed. 15 tablet K[place]ana Ruppel, DO      I have reviewed the PDMP during this encounter.   Lyndee Hensen, DO 11/15/21 1211

## 2021-11-15 NOTE — Discharge Instructions (Addendum)
Pase por la farmacia a recoger sus recetas. Use tramadol segn sea necesario para el dolor intenso. Puede tomar Tylenol 1000 mg hasta 3 veces al da. Ibuprofeno/Motrin 400 mg hasta 3 veces al da.]  Stop by the pharmacy to pick up your prescriptions.  Use tramadol as needed for severe pain.  You can take Tylenol 1000 mg up to 3 times a day.  Ibuprofen/Motrin 400 mg up to 3 times a day.

## 2021-11-15 NOTE — ED Triage Notes (Signed)
Pt states she has been followed by wound care for infection LT ring finger, pt also states she saw them yesterday, pt states pain worsened last night. Pt reports throbbing pain

## 2021-11-20 NOTE — Progress Notes (Signed)
Olivia Ramos, Olivia Ramos ([id number]) Visit Report for 11/14/2021 Arrival Information Details Patient Name: Olivia Ramos, Olivia Ramos Date of Service: 11/14/2021 12:45 PM Medical Record Number: [id number] Patient Account Number: [id number] Date of Birth/Sex: 06/06/1955 (66 y.o. F) Treating RN: Cornell Barman Primary Care [place] Blizzard: Danae Orleans Other Clinician: Jacqulyn Bath Referring Jadyn Brasher: Danae Orleans Treating Deborrah Mabin/Extender: Skipper Cliche in Treatment: 3 Visit Information History Since Last Visit All ordered tests and consults were completed: No Patient Arrived: Ambulatory Added or deleted any medications: No Arrival Time: 12:50 Any new allergies or adverse reactions: No Transfer Assistance: None Had a fall or experienced change in No Patient Requires Transmission-Based Precautions: No activities of daily living that may affect Patient Has Alerts: No risk of falls: Hospitalized since last visit: No Pain Present Now: Yes Electronic Signature(s) Signed: 11/14/2021 4:28:06 PM By: Massie Kluver Entered By: Massie Kluver on 11/14/2021 12:57:01 Olivia Ramos, Olivia Ramos ([id number]) -------------------------------------------------------------------------------- Clinic Level of Care Assessment Details Patient Name: Olivia Ramos Date of Service: 11/14/2021 12:45 PM Medical Record Number: [id number] Patient Account Number: [id number] Date of Birth/Sex: 11/05/1955 (66 y.o. F) Treating RN: Cornell Barman Primary Care Emmilia Sowder: Danae Orleans Other Clinician: Jacqulyn Bath Referring Belma Dyches: Danae Orleans Treating Adler Alton/Extender: Skipper Cliche in Treatment: 3 Clinic Level of Care Assessment Items TOOL 4 Quantity Score []  - Use when only an EandM is performed on FOLLOW-UP visit 0 ASSESSMENTS - Nursing Assessment / Reassessment X - Reassessment of Co-morbidities (includes updates in patient status) 1 10 X- 1 5 Reassessment of Adherence to Treatment Plan ASSESSMENTS - Wound and Skin  Assessment / Reassessment X - Simple Wound Assessment / Reassessment - one wound 1 5 []  - 0 Complex Wound Assessment / Reassessment - multiple wounds []  - 0 Dermatologic / Skin Assessment (not related to wound area) ASSESSMENTS - Focused Assessment []  - Circumferential Edema Measurements - multi extremities 0 []  - 0 Nutritional Assessment / Counseling / Intervention []  - 0 Lower Extremity Assessment (monofilament, tuning fork, pulses) []  - 0 Peripheral Arterial Disease Assessment (using hand held doppler) ASSESSMENTS - Ostomy and/or Continence Assessment and Care []  - Incontinence Assessment and Management 0 []  - 0 Ostomy Care Assessment and Management (repouching, etc.) PROCESS - Coordination of Care X - Simple Patient / Family Education for ongoing care 1 15 []  - 0 Complex (extensive) Patient / Family Education for ongoing care []  - 0 Staff obtains Programmer, systems, Records, Test Results / Process Orders []  - 0 Staff telephones HHA, Nursing Homes / Clarify orders / etc []  - 0 Routine Transfer to another Facility (non-emergent condition) []  - 0 Routine Hospital Admission (non-emergent condition) []  - 0 New Admissions / Biomedical engineer / Ordering NPWT, Apligraf, etc. []  - 0 Emergency Hospital Admission (emergent condition) X- 1 10 Simple Discharge Coordination []  - 0 Complex (extensive) Discharge Coordination PROCESS - Special Needs []  - Pediatric / Minor Patient Management 0 []  - 0 Isolation Patient Management []  - 0 Hearing / Language / Visual special needs []  - 0 Assessment of Community assistance (transportation, D/C planning, etc.) []  - 0 Additional assistance / Altered mentation []  - 0 Support Surface(s) Assessment (bed, cushion, seat, etc.) INTERVENTIONS - Wound Cleansing / Measurement Inscore, Alexandr ([id number]) X- 1 5 Simple Wound Cleansing - one wound []  - 0 Complex Wound Cleansing - multiple wounds X- 1 5 Wound Imaging (photographs - any number of  wounds) []  - 0 Wound Tracing (instead of photographs) X- 1 5 Simple Wound Measurement - one wound []  - 0 Complex Wound Measurement - multiple wounds INTERVENTIONS -  Wound Dressings []  - Small Wound Dressing one or multiple wounds 0 X- 1 15 Medium Wound Dressing one or multiple wounds []  - 0 Large Wound Dressing one or multiple wounds X- 1 5 Application of Medications - topical []  - 0 Application of Medications - injection INTERVENTIONS - Miscellaneous []  - External ear exam 0 []  - 0 Specimen Collection (cultures, biopsies, blood, body fluids, etc.) []  - 0 Specimen(s) / Culture(s) sent or taken to Lab for analysis []  - 0 Patient Transfer (multiple staff / / Similar devices) []  - 0 Simple Staple / Suture removal (25 or less) []  - 0 Complex Staple / Suture removal (26 or more) []  - 0 Hypo / Hyperglycemic Management (close monitor of Blood Glucose) []  - 0 Ankle / Brachial Index (ABI) - do not check if billed separately X- 1 5 Vital Signs Has the patient been seen at the hospital within the last three years: Yes Total Score: 85 Level Of Care: New/Established - Level 3 Electronic Signature(s) Signed: 11/14/2021 4:28:06 PM By: Entered By: on 11/14/2021 13:28:58 ( ) -------------------------------------------------------------------------------- Encounter Discharge Information Details Patient Name: Nurse, adult Date of Service: 11/14/2021 12:45 PM Medical Record Number: Patient Account Number: Date of Birth/Sex: 1955-08-14 (66 y.o. F) Treating RN: Betha Loa Primary Care Patric Vanpelt: Betha Loa Other Clinician: 01/14/2022 Referring Delorice Bannister: Olivia Ramos Treating Vincenzo Stave/Extender: [id number] in Treatment: 3 Encounter Discharge Information Items Discharge Condition: Stable Ambulatory Status: Ambulatory Discharge Destination: Home Transportation: Private  Auto Accompanied By: son Schedule Follow-up Appointment: Yes Clinical Summary of Care: Electronic Signature(s) Signed: 11/14/2021 4:28:06 PM By: 01/14/2022 Entered By: [id number] on 11/14/2021 13:34:50 Olivia Ramos, 07-11-2000 (Huel Coventry) -------------------------------------------------------------------------------- Lower Extremity Assessment Details Patient Name: Olivia Ramos Date of Service: 11/14/2021 12:45 PM Medical Record Number: Olivia Ramos Patient Account Number: Rowan Blase Date of Birth/Sex: 18-Feb-1956 (65 y.o. F) Treating RN: Betha Loa Primary Care Karsten Howry: 01/14/2022 Other Clinician: [place] Referring Nasim Garofano: Byrd Hesselbach Treating Satvik Parco/Extender: [id number] in Treatment: 3 Electronic Signature(s) Signed: 11/14/2021 4:28:06 PM By: 01/14/2022 Signed: 11/17/2021 5:27:45 PM By: [id number], BSN, RN, CWS, Kim RN, BSN Entered By: 02/22/1956 on 11/14/2021 13:05:14 ALEZA, PEW (Olivia Ramos) -------------------------------------------------------------------------------- Multi Wound Chart Details Patient Name: Olivia Ramos Date of Service: 11/14/2021 12:45 PM Medical Record Number: Rowan Blase Patient Account Number: 01/14/2022 Date of Birth/Sex: 11/02/1955 (65 y.o. F) Treating RN: Elliot Gurney Primary Care Goldie Tregoning: Betha Loa Other Clinician: 01/14/2022 Referring Charnel Giles: Olivia Ramos Treating Olando Willems/Extender: [id number] in Treatment: 3 Vital Signs Height(in): 60 Pulse(bpm): 98 Weight(lbs): 148 Blood Pressure(mmHg): 166/90 Body Mass Index(BMI): 28.9 Temperature(F): 98.1 Respiratory Rate(breaths/min): 18 Photos: [N/A:N/A] Wound Location: Left, Distal Hand - 3rd Digit N/A N/A Wounding Event: Gradually Appeared N/A N/A Primary Etiology: Infection - not elsewhere classified N/A N/A Comorbid History: Asthma, Hypertension, Type II N/A N/A Diabetes Date Acquired: 08/04/2021 N/A N/A Weeks of Treatment: 3 N/A  N/A Wound Status: Open N/A N/A Wound Recurrence: No N/A N/A Measurements L x W x D (cm) 1.1x0.8x0.1 N/A N/A Area (cm) : 0.691 N/A N/A Volume (cm) : 0.069 N/A N/A % Reduction in Area: -340.10% N/A N/A % Reduction in Volume: -331.20% N/A N/A Classification: Full Thickness Without Exposed N/A N/A Support Structures Exudate Amount: None Present N/A N/A Granulation Amount: None Present (0%) N/A N/A Necrotic Amount: Large (67-100%) N/A N/A Necrotic Tissue: Eschar N/A N/A Exposed Structures: Fascia: No N/A N/A Fat Layer (Subcutaneous Tissue): No Tendon: No Muscle: No Joint: No Bone: No  Epithelialization: None N/A N/A Treatment Notes Electronic Signature(s) Signed: 11/14/2021 4:28:06 PM By: Betha Loa Entered By: Betha Loa on 11/14/2021 13:05:32 INSIYA, Olivia Ramos ([id number]) -------------------------------------------------------------------------------- Multi-Disciplinary Care Plan Details Patient Name: Olivia Ramos Date of Service: 11/14/2021 12:45 PM Medical Record Number: [id number] Patient Account Number: [id number] Date of Birth/Sex: 08/25/1955 (65 y.o. F) Treating RN: Huel Coventry Primary Care Zay Yeargan: Olivia Ramos Other Clinician: Izetta Ramos Referring Zemira Zehring: Olivia Ramos Treating Darcia Lampi/Extender: Rowan Blase in Treatment: 3 Active Inactive Wound/Skin Impairment Nursing Diagnoses: Knowledge deficit related to ulceration/compromised skin integrity Goals: Patient/caregiver will verbalize understanding of skin care regimen Date Initiated: 10/24/2021 Target Resolution Date: 11/24/2021 Goal Status: Active Ulcer/skin breakdown will have a volume reduction of 30% by week 4 Date Initiated: 10/24/2021 Target Resolution Date: 11/24/2021 Goal Status: Active Ulcer/skin breakdown will have a volume reduction of 50% by week 8 Date Initiated: 10/24/2021 Target Resolution Date: 12/25/2021 Goal Status: Active Ulcer/skin breakdown will have a volume  reduction of 80% by week 12 Date Initiated: 10/24/2021 Target Resolution Date: 01/24/2022 Goal Status: Active Ulcer/skin breakdown will heal within 14 weeks Date Initiated: 10/24/2021 Target Resolution Date: 02/24/2022 Goal Status: Active Interventions: Assess patient/caregiver ability to obtain necessary supplies Assess patient/caregiver ability to perform ulcer/skin care regimen upon admission and as needed Assess ulceration(s) every visit Notes: Electronic Signature(s) Signed: 11/14/2021 4:28:06 PM By: Betha Loa Signed: 11/17/2021 5:27:45 PM By: Elliot Gurney, BSN, RN, CWS, Kim RN, BSN Entered By: Betha Loa on 11/14/2021 13:05:18 [place], Byrd Hesselbach ([id number]) -------------------------------------------------------------------------------- Pain Assessment Details Patient Name: Olivia Ramos Date of Service: 11/14/2021 12:45 PM Medical Record Number: [id number] Patient Account Number: [id number] Date of Birth/Sex: May 28, 1955 (65 y.o. F) Treating RN: Huel Coventry Primary Care Berlinda Farve: Olivia Ramos Other Clinician: Izetta Ramos Referring Alauna Hayden: Olivia Ramos Treating Tilford Deaton/Extender: Rowan Blase in Treatment: 3 Active Problems Location of Pain Severity and Description of Pain Patient Has Paino Yes Site Locations Pain Location: Pain in Ulcers Duration of the Pain. Constant / Intermittento Constant Rate the pain. Current Pain Level: 10 Character of Pain Describe the Pain: Burning, Throbbing Pain Management and Medication Current Pain Management: Medication: Yes Electronic Signature(s) Signed: 11/14/2021 4:28:06 PM By: Betha Loa Signed: 11/17/2021 5:27:45 PM By: Elliot Gurney, BSN, RN, CWS, Kim RN, BSN Entered By: Betha Loa on 11/14/2021 12:59:56 Olivia Ramos, Olivia Ramos ([id number]) -------------------------------------------------------------------------------- Patient/Caregiver Education Details Patient Name: Olivia Ramos Date of Service: 11/14/2021 12:45  PM Medical Record Number: [id number] Patient Account Number: [id number] Date of Birth/Gender: 1955-10-22 (65 y.o. F) Treating RN: Huel Coventry Primary Care Physician: Olivia Ramos Other Clinician: Izetta Ramos Referring Physician: Jenell Ramos Treating Physician/Extender: Rowan Blase in Treatment: 3 Education Assessment Education Provided To: Patient Education Topics Provided Wound/Skin Impairment: Handouts: Other: continue wound care as directed Electronic Signature(s) Signed: 11/14/2021 4:28:06 PM By: Betha Loa Entered By: Betha Loa on 11/14/2021 13:30:53 [place], [place] ([id number]) -------------------------------------------------------------------------------- Wound Assessment Details Patient Name: Olivia Ramos Date of Service: 11/14/2021 12:45 PM Medical Record Number: [id number] Patient Account Number: [id number] Date of Birth/Sex: [birth year] (65 y.o. F) Treating RN: Huel Coventry Primary Care Danaria Larsen: Olivia Ramos Other Clinician: Izetta Ramos Referring Criselda Starke: Olivia Ramos Treating Niccole Witthuhn/Extender: Rowan Blase in Treatment: 3 Wound Status Wound Number: 1 Primary Etiology: Infection - not elsewhere classified Wound Location: Left, Distal Hand - 3rd Digit Wound Status: Open Wounding Event: Gradually Appeared Comorbid History: Asthma, Hypertension, Type II Diabetes Date Acquired: 08/04/2021 Weeks Of Treatment: 3 Clustered Wound: No Photos Wound Measurements Length: (cm) 1.1 Width: (cm) 0.8 Depth: (cm) 0.1 Area: (cm) 0.691 Volume: (cm) 0.069 %  Reduction in Area: -340.1% % Reduction in Volume: -331.2% Epithelialization: None Wound Description Classification: Full Thickness Without Exposed Support Structures Exudate Amount: None Present Foul Odor After Cleansing: No Slough/Fibrino Yes Wound Bed Granulation Amount: None Present (0%) Exposed Structure Necrotic Amount: Large (67-100%) Fascia Exposed: No Necrotic  Quality: Eschar Fat Layer (Subcutaneous Tissue) Exposed: No Tendon Exposed: No Muscle Exposed: No Joint Exposed: No Bone Exposed: No Treatment Notes Wound #1 (Hand - 3rd Digit) Wound Laterality: Left, Distal Cleanser Peri-Wound Care Topical Santyl Collagenase Ointment, 30 (gm), tube Discharge Instruction: apply nickel thick to wound bed only Olivia Ramos, Olivia ([id number]) Primary Dressing Xeroform 4x4-HBD (in/in) Discharge Instruction: Apply Xeroform 4x4-HBD (in/in) as directed Secondary Dressing Coverlet Latex-Free Fabric Adhesive Dressings Discharge Instruction: 1.5 x 2 Secured With Compression Wrap Compression Stockings Add-Ons Electronic Signature(s) Signed: 11/14/2021 4:28:06 PM By: Massie Kluver Signed: 11/17/2021 5:27:45 PM By: Gretta Cool, BSN, RN, CWS, Kim RN, BSN Entered By: Massie Kluver on 11/14/2021 13:05:05 Olivia Ramos, Olivia Ramos ([id number]) -------------------------------------------------------------------------------- Vitals Details Patient Name: Olivia Ramos Date of Service: 11/14/2021 12:45 PM Medical Record Number: [id number] Patient Account Number: [id number] Date of Birth/Sex: 1955-12-09 (65 y.o. F) Treating RN: Cornell Barman Primary Care Matthewjames Petrasek: Danae Orleans Other Clinician: Jacqulyn Bath Referring Valerie Fredin: Danae Orleans Treating Delcenia Inman/Extender: Skipper Cliche in Treatment: 3 Vital Signs Time Taken: 12:57 Temperature (F): 98.1 Height (in): 60 Pulse (bpm): 98 Weight (lbs): 148 Respiratory Rate (breaths/min): 18 Body Mass Index (BMI): 28.9 Blood Pressure (mmHg): 166/90 Reference Range: 80 - 120 mg / dl Electronic Signature(s) Signed: 11/14/2021 4:28:06 PM By: Massie Kluver Entered By: Massie Kluver on 11/14/2021 12:59:53

## 2021-11-24 ENCOUNTER — Encounter: Payer: BC Managed Care – PPO | Admitting: Physician Assistant

## 2021-11-24 DIAGNOSIS — E11622 Type 2 diabetes mellitus with other skin ulcer: Secondary | ICD-10-CM | POA: Diagnosis not present

## 2021-11-24 NOTE — Progress Notes (Signed)
CLARISSA, LAIRD ([id number]) Visit Report for 11/24/2021 Chief Complaint Document Details Patient Name: Olivia Ramos, Olivia Ramos Date of Service: 11/24/2021 3:45 PM Medical Record Number: [id number] Patient Account Number: [id number] Date of Birth/Sex: 1955/11/29 (66 y.o. F) Treating RN: Yevonne Pax Primary Care Provider: Jenell Milliner Other Clinician: Betha Loa Referring Provider: Jenell Milliner Treating Provider/Extender: Rowan Blase in Treatment: 4 Information Obtained from: Patient Chief Complaint Left 3rd finger ulcer Electronic Signature(s) Signed: 11/24/2021 3:50:18 PM By: Lenda Kelp PA-C Entered By: Lenda Kelp on 11/24/2021 15:50:17 [place], Byrd Hesselbach ([id number]) -------------------------------------------------------------------------------- HPI Details Patient Name: Olivia Ramos Date of Service: 11/24/2021 3:45 PM Medical Record Number: [id number] Patient Account Number: [id number] Date of Birth/Sex: 07/27/1955 (66 y.o. F) Treating RN: Yevonne Pax Primary Care Provider: Jenell Milliner Other Clinician: Betha Loa Referring Provider: Jenell Milliner Treating Provider/Extender: Rowan Blase in Treatment: 4 History of Present Illness HPI Description: 10-24-2021 upon evaluation today patient appears to be doing somewhat poorly in regard to a wound on her finger. This occurred after she was pulling on a hangnail. Subsequently this is something that seem to come up all of a sudden and has been extremely painful for her. Fortunately there does not appear to be any signs of active systemically though locally that is a different story. There is actually some eschar noted and it is extremely painful to touch she has been taking meloxicam which was helping to some degree. With that being said she has been on Keflex but that did not seem to help at all. The patient was referred to our office for further evaluation and treatment. She does have a history of  diabetes mellitus type 2 with hemoglobin A1c of 6.3 on 09-15-2021. 11-07-2021 upon evaluation today patient appears to be doing really a little bit worse in my opinion compared to last time I saw her. Fortunately I do not see any evidence of infection systemically but unfortunately she is still having what appears to be some infection locally. I do not think that there is any need for sending her to the ER or anything that drastic but I do believe switching her antibiotic and switch her over to [facility] as well should probably be the best way to go at this point. 11-14-2021 upon evaluation today patient appears to be still having quite a bit of an issue with her finger at this point. Fortunately I do not see any signs of infection currently which is good news. With that being said unfortunately I do think she still having a lot of pain. I do not feel like there is any worsening overall and in fact I feel like the redness is improved to some degree compared to what it was. She is still on the Levaquin which I put her on for 14 days starting last week. I also did actually go ahead and give her a prescription as well for Santyl but we have been having a hard time getting this approved. In the end it was denied by insurance. I gave her the co-pay card today so this should be only $80. She has had multiple x-rays of the hand which show no signs of osteomyelitis. She continues to have significant issues with pain and would like a referral to pain management. Unfortunately there is anyone in [place] who does pain management I am wondering if we can possibly see if Dr. Vear Clock in [facility] would be willing to take her on for a short amount of time while we get the wound healed  to try to help with the discomfort as this is continued to be an ongoing issue but unfortunately we do not prescribe any pain medications out of the wound care center. 11-24-2021 upon evaluation today patient appears to be  doing well currently in regard to her pain she tells me is a little bit better still stating to be an 8 out of 10. I am not certain that the redness has really improved much at all despite being placed on the Levaquin. She did go to the hospital the day after I saw her last in was given tramadol. With that being said she is still using the Santyl and has not heard from the pain clinic as of yet we did make that referral last week. They did do another x-ray of the ER and there is still no signs of osteomyelitis nonetheless considering how this is going and the pain that she has and I am going to go ahead and proceed with doing a MRI of the finger/hand in order to see if there is any evidence of osteomyelitis or other worsening at this point. Just does not seem to be progressing along the right track but I was hoping for as far as clearing away the eschar and hoping to get this to heal. Electronic Signature(s) Signed: 11/24/2021 4:39:55 PM By: Lenda Kelp PA-C Entered By: Lenda Kelp on 11/24/2021 16:39:55 Akhiok, Zhuri ([id number]) -------------------------------------------------------------------------------- Physical Exam Details Patient Name: Olivia Ramos Date of Service: 11/24/2021 3:45 PM Medical Record Number: [id number] Patient Account Number: [id number] Date of Birth/Sex: 07-14-1955 (66 y.o. F) Treating RN: Yevonne Pax Primary Care Provider: Jenell Milliner Other Clinician: Referring Provider: Jenell Milliner Treating Provider/Extender: Rowan Blase in Treatment: 4 Constitutional Well-nourished and well-hydrated in no acute distress. Respiratory normal breathing without difficulty. Psychiatric this patient is able to make decisions and demonstrates good insight into disease process. Alert and Oriented x 3. pleasant and cooperative. Notes Upon inspection patient's wound bed actually showed signs of still continued eschar unfortunately she is still having a lot of  discomfort. I do not see any evidence of infection systemically though locally there is still erythema at the base of the wound I am concerned that we may be missing something I think we probably need to proceed with an MRI and the ER admission this as well. She has had 3 x-rays all negative for osteomyelitis or gas underneath the skin surface. Electronic Signature(s) Signed: 11/24/2021 4:40:40 PM By: Lenda Kelp PA-C Entered By: Lenda Kelp on 11/24/2021 16:40:40 Hiram, Demesha ([id number]) -------------------------------------------------------------------------------- Physician Orders Details Patient Name: Olivia Ramos Date of Service: 11/24/2021 3:45 PM Medical Record Number: [id number] Patient Account Number: [id number] Date of Birth/Sex: 12-23-1955 (66 y.o. F) Treating RN: Yevonne Pax Primary Care Provider: Jenell Milliner Other Clinician: Betha Loa Referring Provider: Jenell Milliner Treating Provider/Extender: Rowan Blase in Treatment: 4 Verbal / Phone Orders: No Diagnosis Coding ICD-10 Coding Code Description L03.012 Cellulitis of left finger L98.492 Non-pressure chronic ulcer of skin of other sites with fat layer exposed E11.622 Type 2 diabetes mellitus with other skin ulcer I10 Essential (primary) hypertension Follow-up Appointments o Return Appointment in 2 weeks. Bathing/ Shower/ Hygiene o May shower; gently cleanse wound with antibacterial soap, rinse and pat dry prior to dressing wounds Medications-Please add to medication list. o Take one 500mg  Tylenol (Acetaminophen) and one 200mg  Motrin (Ibuprofen) every 6 hours for pain. Do not take ibuprofen if you are on blood thinners or have stomach ulcers. - Tylenol for pain   o P.O. Antibiotics - Continue Levaquin until finished o Santyl Enzymatic Ointment - apply small amount to wound bed, then cover with a small piece of yellow Xeroform, then cover with a bandaid only change dressing once  daily Wound Treatment Wound #1 - Hand - 3rd Digit Wound Laterality: Left, Distal Topical: Santyl Collagenase Ointment, 30 (gm), tube 1 x Per Day/30 Days Discharge Instructions: apply nickel thick to wound bed only Primary Dressing: Xeroform 4x4-HBD (in/in) 1 x Per Day/30 Days Discharge Instructions: Apply Xeroform 4x4-HBD (in/in) as directed Secondary Dressing: Coverlet Latex-Free Fabric Adhesive Dressings 1 x Per Day/30 Days Discharge Instructions: 1.5 x 2 Radiology o MRI with and without Contrast left hand ring finger - non healing wound left ring finger - (ICD10 L03.012 - Cellulitis of left finger) Electronic Signature(s) Unsigned Entered ByYevonne Pax on 11/24/2021 16:19:54 Signature(s): Date(s): ZAKYA, HALABI ([id number]) -------------------------------------------------------------------------------- Problem List Details Patient Name: PEGGIE, HORNAK Date of Service: 11/24/2021 3:45 PM Medical Record Number: [id number] Patient Account Number: [id number] Date of Birth/Sex: [birth year] (66 y.o. F) Treating RN: Yevonne Pax Primary Care Provider: Jenell Milliner Other Clinician: Betha Loa Referring Provider: Jenell Milliner Treating Provider/Extender: Rowan Blase in Treatment: 4 Active Problems ICD-10 Encounter Code Description Active Date MDM Diagnosis L03.012 Cellulitis of left finger 10/24/2021 No Yes L98.492 Non-pressure chronic ulcer of skin of other sites with fat layer exposed 10/24/2021 No Yes E11.622 Type 2 diabetes mellitus with other skin ulcer 10/24/2021 No Yes I10 Essential (primary) hypertension 10/24/2021 No Yes Inactive Problems Resolved Problems Electronic Signature(s) Signed: 11/24/2021 3:50:15 PM By: Lenda Kelp PA-C Entered By: Lenda Kelp on 11/24/2021 15:50:15 Arthur, Armella ([id number]) -------------------------------------------------------------------------------- Progress Note Details Patient Name: Olivia Ramos Date  of Service: 11/24/2021 3:45 PM Medical Record Number: [id number] Patient Account Number: [id number] Date of Birth/Sex: [birth year] (66 y.o. F) Treating RN: Yevonne Pax Primary Care Provider: Jenell Milliner Other Clinician: Referring Provider: Jenell Milliner Treating Provider/Extender: Rowan Blase in Treatment: 4 Subjective Chief Complaint Information obtained from Patient Left 3rd finger ulcer History of Present Illness (HPI) 10-24-2021 upon evaluation today patient appears to be doing somewhat poorly in regard to a wound on her finger. This occurred after she was pulling on a hangnail. Subsequently this is something that seem to come up all of a sudden and has been extremely painful for her. Fortunately there does not appear to be any signs of active systemically though locally that is a different story. There is actually some eschar noted and it is extremely painful to touch she has been taking meloxicam which was helping to some degree. With that being said she has been on Keflex but that did not seem to help at all. The patient was referred to our office for further evaluation and treatment. She does have a history of diabetes mellitus type 2 with hemoglobin A1c of 6.3 on 09-15-2021. 11-07-2021 upon evaluation today patient appears to be doing really a little bit worse in my opinion compared to last time I saw her. Fortunately I do not see any evidence of infection systemically but unfortunately she is still having what appears to be some infection locally. I do not think that there is any need for sending her to the ER or anything that drastic but I do believe switching her antibiotic and switch her over to [facility] as well should probably be the best way to go at this point. 11-14-2021 upon evaluation today patient appears to be still having quite a bit of an issue with her finger  at this point. Fortunately I do not see any signs of infection currently which is good news. With that being  said unfortunately I do think she still having a lot of pain. I do not feel like there is any worsening overall and in fact I feel like the redness is improved to some degree compared to what it was. She is still on the Levaquin which I put her on for 14 days starting last week. I also did actually go ahead and give her a prescription as well for Santyl but we have been having a hard time getting this approved. In the end it was denied by insurance. I gave her the co-pay card today so this should be only $80. She has had multiple x-rays of the hand which show no signs of osteomyelitis. She continues to have significant issues with pain and would like a referral to pain management. Unfortunately there is anyone in [place] who does pain management I am wondering if we can possibly see if Dr. Vear Clock in [facility] would be willing to take her on for a short amount of time while we get the wound healed to try to help with the discomfort as this is continued to be an ongoing issue but unfortunately we do not prescribe any pain medications out of the wound care center. 11-24-2021 upon evaluation today patient appears to be doing well currently in regard to her pain she tells me is a little bit better still stating to be an 8 out of 10. I am not certain that the redness has really improved much at all despite being placed on the Levaquin. She did go to the hospital the day after I saw her last in was given tramadol. With that being said she is still using the Santyl and has not heard from the pain clinic as of yet we did make that referral last week. They did do another x-ray of the ER and there is still no signs of osteomyelitis nonetheless considering how this is going and the pain that she has and I am going to go ahead and proceed with doing a MRI of the finger/hand in order to see if there is any evidence of osteomyelitis or other worsening at this point. Just does not seem to be  progressing along the right track but I was hoping for as far as clearing away the eschar and hoping to get this to heal. Objective Constitutional Well-nourished and well-hydrated in no acute distress. Vitals Time Taken: 4:10 PM, Height: 60 in, Weight: 148 lbs, BMI: 28.9, Temperature: 98.3 F, Pulse: 105 bpm, Respiratory Rate: 16 breaths/min, Blood Pressure: 201/106 mmHg. Respiratory normal breathing without difficulty. Psychiatric this patient is able to make decisions and demonstrates good insight into disease process. Alert and Oriented x 3. pleasant and cooperative. General Notes: Upon inspection patient's wound bed actually showed signs of still continued eschar unfortunately she is still having a lot of discomfort. I do not see any evidence of infection systemically though locally there is still erythema at the base of the wound I am concerned that we may be missing something I think we probably need to proceed with an MRI and the ER admission this as well. She has had 3 x-rays all Langton, Shonya ([id number]) negative for osteomyelitis or gas underneath the skin surface. Integumentary (Hair, Skin) Wound #1 status is Open. Original cause of wound was Gradually Appeared. The date acquired was: 08/04/2021. The wound has been in treatment 4 weeks. The  wound is located on the Left,Distal Hand - 3rd Digit. The wound measures 1cm length x 0.8cm width x 0.1cm depth; 0.628cm^2 area and 0.063cm^3 volume. There is no tunneling or undermining noted. There is a medium amount of serosanguineous drainage noted. There is no granulation within the wound bed. There is a large (67-100%) amount of necrotic tissue within the wound bed including Eschar. Assessment Active Problems ICD-10 Cellulitis of left finger Non-pressure chronic ulcer of skin of other sites with fat layer exposed Type 2 diabetes mellitus with other skin ulcer Essential (primary) hypertension Plan Follow-up Appointments: Return  Appointment in 2 weeks. Bathing/ Shower/ Hygiene: May shower; gently cleanse wound with antibacterial soap, rinse and pat dry prior to dressing wounds Medications-Please add to medication list.: Take one 500mg  Tylenol (Acetaminophen) and one 200mg  Motrin (Ibuprofen) every 6 hours for pain. Do not take ibuprofen if you are on blood thinners or have stomach ulcers. - Tylenol for pain P.O. Antibiotics - Continue Levaquin until finished Santyl Enzymatic Ointment - apply small amount to wound bed, then cover with a small piece of yellow Xeroform, then cover with a bandaid only change dressing once daily Radiology ordered were: MRI with and without Contrast left hand ring finger - non healing wound left ring finger WOUND #1: - Hand - 3rd Digit Wound Laterality: Left, Distal Topical: Santyl Collagenase Ointment, 30 (gm), tube 1 x Per Day/30 Days Discharge Instructions: apply nickel thick to wound bed only Primary Dressing: Xeroform 4x4-HBD (in/in) 1 x Per Day/30 Days Discharge Instructions: Apply Xeroform 4x4-HBD (in/in) as directed Secondary Dressing: Coverlet Latex-Free Fabric Adhesive Dressings 1 x Per Day/30 Days Discharge Instructions: 1.5 x 2 1. Would recommend currently that we go ahead and put an order for MRI of the hand with and without contrast. 2. Also can recommend that we have the patient continue with the Santyl which I do think is the best way to go over using the Xeroform over top of this. 3. We will can use Band-Aid to secure in place think this is probably better than the gauze to keep it from drying out as much. We will see patient back for reevaluation in 2 weeks here in the clinic. If anything worsens or changes patient will contact our office for additional recommendations. Electronic Signature(s) Signed: 11/24/2021 4:41:36 PM By: Lenda KelpStone III, Aletta Edmunds PA-C Entered By: Lenda KelpStone III, Donovon Micheletti on 11/24/2021 16:41:35 HomesteadHERNANDEZ, Chamia  ([id number]) -------------------------------------------------------------------------------- SuperBill Details Patient Name: Olivia GreenHERNANDEZ, Killian Date of Service: 11/24/2021 Medical Record Number: [id number] Patient Account Number: [id number] Date of Birth/Sex: October 14, 1955 (66 y.o. F) Treating RN: Yevonne PaxEpps, Carrie Primary Care Provider: Jenell MillinerLuyando, Yvonne Other Clinician: Betha LoaVenable, Angie Referring Provider: Jenell MillinerLuyando, Yvonne Treating Provider/Extender: Rowan BlaseStone, Hanif Radin Weeks in Treatment: 4 Diagnosis Coding ICD-10 Codes Code Description L03.012 Cellulitis of left finger L98.492 Non-pressure chronic ulcer of skin of other sites with fat layer exposed E11.622 Type 2 diabetes mellitus with other skin ulcer I10 Essential (primary) hypertension Facility Procedures CPT4 Code: [id number] Description: 99213 - WOUND CARE VISIT-LEV 3 EST PT Modifier: Quantity: 1 Physician Procedures CPT4 Code: [id number] Description: 99214 - WC PHYS LEVEL 4 - EST PT Modifier: Quantity: 1 CPT4 Code: Description: ICD-10 Diagnosis Description L03.012 Cellulitis of left finger L98.492 Non-pressure chronic ulcer of skin of other sites with fat layer expos E11.622 Type 2 diabetes mellitus with other skin ulcer I10 Essential (primary) hypertension Modifier: ed Quantity: Electronic Signature(s) Signed: 11/24/2021 4:43:01 PM By: Lenda KelpStone III, Laylynn Campanella PA-C Entered By: Lenda KelpStone III, Hollyann Pablo on 11/24/2021 16:43:01

## 2021-11-24 NOTE — Progress Notes (Signed)
MAHUM, BETTEN ([id number]) Visit Report for 11/24/2021 Arrival Information Details Patient Name: Olivia Ramos, Olivia Ramos Date of Service: 11/24/2021 3:45 PM Medical Record Number: [id number] Patient Account Number: [id number] Date of Birth/Sex: January 14, 1956 (65 y.o. F) Treating RN: Yevonne Pax Primary Care Marshal Schrecengost: Jenell Milliner Other Clinician: Betha Loa Referring Jaylea Plourde: Jenell Milliner Treating Shakeera Rightmyer/Extender: Rowan Blase in Treatment: 4 Visit Information History Since Last Visit All ordered tests and consults were completed: No Patient Arrived: Ambulatory Added or deleted any medications: No Arrival Time: 16:09 Any new allergies or adverse reactions: No Accompanied By: self Had a fall or experienced change in No Transfer Assistance: None activities of daily living that may affect Patient Identification Verified: Yes risk of falls: Secondary Verification Process Completed: Yes Signs or symptoms of abuse/neglect since last visito No Patient Requires Transmission-Based Precautions: No Hospitalized since last visit: No Patient Has Alerts: No Implantable device outside of the clinic excluding No cellular tissue based products placed in the center since last visit: Has Dressing in Place as Prescribed: Yes Pain Present Now: No Electronic Signature(s) Unsigned Entered ByYevonne Pax on 11/24/2021 16:10:20 Signature(s): Date(s): ARRYN, TERRONES ([id number]) -------------------------------------------------------------------------------- Clinic Level of Care Assessment Details Patient Name: Olivia Ramos, Olivia Ramos Date of Service: 11/24/2021 3:45 PM Medical Record Number: [id number] Patient Account Number: [id number] Date of Birth/Sex: [birth year] (65 y.o. F) Treating RN: Yevonne Pax Primary Care Marvina Danner: Jenell Milliner Other Clinician: Betha Loa Referring Hero Mccathern: Jenell Milliner Treating Michel Hendon/Extender: Rowan Blase in Treatment: 4 Clinic Level of  Care Assessment Items TOOL 4 Quantity Score X - Use when only an EandM is performed on FOLLOW-UP visit 1 0 ASSESSMENTS - Nursing Assessment / Reassessment X - Reassessment of Co-morbidities (includes updates in patient status) 1 10 X- 1 5 Reassessment of Adherence to Treatment Plan ASSESSMENTS - Wound and Skin Assessment / Reassessment X - Simple Wound Assessment / Reassessment - one wound 1 5 []  - 0 Complex Wound Assessment / Reassessment - multiple wounds []  - 0 Dermatologic / Skin Assessment (not related to wound area) ASSESSMENTS - Focused Assessment []  - Circumferential Edema Measurements - multi extremities 0 []  - 0 Nutritional Assessment / Counseling / Intervention []  - 0 Lower Extremity Assessment (monofilament, tuning fork, pulses) []  - 0 Peripheral Arterial Disease Assessment (using hand held doppler) ASSESSMENTS - Ostomy and/or Continence Assessment and Care []  - Incontinence Assessment and Management 0 []  - 0 Ostomy Care Assessment and Management (repouching, etc.) PROCESS - Coordination of Care X - Simple Patient / Family Education for ongoing care 1 15 []  - 0 Complex (extensive) Patient / Family Education for ongoing care X- 1 10 Staff obtains , Records, Test Results / Process Orders []  - 0 Staff telephones HHA, Nursing Homes / Clarify orders / etc []  - 0 Routine Transfer to another Facility (non-emergent condition) []  - 0 Routine Hospital Admission (non-emergent condition) []  - 0 New Admissions / / Ordering NPWT, Apligraf, etc. []  - 0 Emergency Hospital Admission (emergent condition) X- 1 10 Simple Discharge Coordination []  - 0 Complex (extensive) Discharge Coordination PROCESS - Special Needs []  - Pediatric / Minor Patient Management 0 []  - 0 Isolation Patient Management []  - 0 Hearing / Language / Visual special needs []  - 0 Assessment of Community assistance (transportation, D/C planning, etc.) []  - 0 Additional  assistance / Altered mentation []  - 0 Support Surface(s) Assessment (bed, cushion, seat, etc.) INTERVENTIONS - Wound Cleansing / Measurement Korb, Ayonna ( ) X- 1 5 Simple Wound Cleansing - one wound []  - 0 Complex  Wound Cleansing - multiple wounds X- 1 5 Wound Imaging (photographs - any number of wounds) []  - 0 Wound Tracing (instead of photographs) X- 1 5 Simple Wound Measurement - one wound []  - 0 Complex Wound Measurement - multiple wounds INTERVENTIONS - Wound Dressings X - Small Wound Dressing one or multiple wounds 1 10 []  - 0 Medium Wound Dressing one or multiple wounds []  - 0 Large Wound Dressing one or multiple wounds []  - 0 Application of Medications - topical []  - 0 Application of Medications - injection INTERVENTIONS - Miscellaneous []  - External ear exam 0 []  - 0 Specimen Collection (cultures, biopsies, blood, body fluids, etc.) []  - 0 Specimen(s) / Culture(s) sent or taken to Lab for analysis []  - 0 Patient Transfer (multiple staff / / Similar devices) []  - 0 Simple Staple / Suture removal (25 or less) []  - 0 Complex Staple / Suture removal (26 or more) []  - 0 Hypo / Hyperglycemic Management (close monitor of Blood Glucose) []  - 0 Ankle / Brachial Index (ABI) - do not check if billed separately X- 1 5 Vital Signs Has the patient been seen at the hospital within the last three years: Yes Total Score: 85 Level Of Care: New/Established - Level 3 Electronic Signature(s) Unsigned Entered By on 11/24/2021 16:18:08 Signature(s): Date(s): Olivia Ramos, Olivia Ramos ( ) -------------------------------------------------------------------------------- Encounter Discharge Information Details Patient Name: Olivia Ramos, Olivia Ramos Date of Service: 11/24/2021 3:45 PM Medical Record Number: Patient Account Number: Date of Birth/Sex: Aug 19, 1955 (65 y.o. F) Treating RN: Primary Care Rishab Stoudt:  Other Clinician: Referring Cynai Skeens: Treating Mayumi Summerson/Extender: Yevonne Pax in Treatment: 4 Encounter Discharge Information Items Discharge Condition: Stable Ambulatory Status: Ambulatory Discharge Destination: Home Transportation: Private Auto Accompanied By: self Schedule Follow-up Appointment: Yes Clinical Summary of Care: Patient Declined Electronic Signature(s) Unsigned Entered By08/23/2023 on 11/24/2021 16:19:57 Signature(s): Date(s): ROBEN, TATSCH ([name]) -------------------------------------------------------------------------------- Lower Extremity Assessment Details Patient Name: Olivia Ramos, Olivia Ramos Date of Service: 11/24/2021 3:45 PM Medical Record Number: [id number] Patient Account Number: 02/22/1956 Date of Birth/Sex: [birth year] (65 y.o. F) Treating RN: Jenell Milliner Primary Care Kaleth Koy: Betha Loa Other Clinician: Jenell Milliner Referring Kathie Posa: Rowan Blase Treating Leticia Mcdiarmid/Extender: Yevonne Pax in Treatment: 4 Electronic Signature(s) Unsigned Entered By: 11/26/2021 on 11/24/2021 16:16:09 Signature(s): Date(s): YICEL, SHANNON ([name]) -------------------------------------------------------------------------------- Multi Wound Chart Details Patient Name: Olivia Ramos, Olivia Ramos Date of Service: 11/24/2021 3:45 PM Medical Record Number: [id number] Patient Account Number: 02/22/1956 Date of Birth/Sex: Mar 14, 1956 (65 y.o. F) Treating RN: Jenell Milliner Primary Care Caylin Raby: Betha Loa Other Clinician: Jenell Milliner Referring Kameryn Tisdel: Rowan Blase Treating Demetrious Rainford/Extender: Yevonne Pax in Treatment: 4 Vital Signs Height(in): 60 Pulse(bpm): 105 Weight(lbs): 148 Blood Pressure(mmHg): 201/106 Body Mass Index(BMI): 28.9 Temperature(F): 98.3 Respiratory Rate(breaths/min): 16 Photos: [N/A:N/A] Wound Location: Left, Distal Hand - 3rd Digit N/A N/A Wounding  Event: Gradually Appeared N/A N/A Primary Etiology: Infection - not elsewhere classified N/A N/A Comorbid History: Asthma, Hypertension, Type II N/A N/A Diabetes Date Acquired: 08/04/2021 N/A N/A Weeks of Treatment: 4 N/A N/A Wound Status: Open N/A N/A Wound Recurrence: No N/A N/A Measurements L x W x D (cm) 1x0.8x0.1 N/A N/A Area (cm) : 0.628 N/A N/A Volume (cm) : 0.063 N/A N/A % Reduction in Area: -300.00% N/A N/A % Reduction in Volume: -293.70% N/A N/A Classification: Full Thickness Without Exposed N/A N/A Support Structures Exudate Amount: Medium N/A N/A Exudate Type: Serosanguineous N/A N/A Exudate Color: red, brown N/A N/A Granulation Amount: None Present (  0%) N/A N/A Necrotic Amount: Large (67-100%) N/A N/A Necrotic Tissue: Eschar N/A N/A Exposed Structures: Fascia: No N/A N/A Fat Layer (Subcutaneous Tissue): No Tendon: No Muscle: No Joint: No Bone: No Epithelialization: None N/A N/A Treatment Notes Electronic Signature(s) Unsigned Entered ByYevonne Pax on 11/24/2021 16:16:38 Signature(s): Date(s): JANETE, QUILLING ([id number], KREEGER ([id number]) -------------------------------------------------------------------------------- Multi-Disciplinary Care Plan Details Patient Name: Olivia Ramos, Olivia Ramos Date of Service: 11/24/2021 3:45 PM Medical Record Number: [id number] Patient Account Number: [id number] Date of Birth/Sex: 08/18/1955 (65 y.o. F) Treating RN: Yevonne Pax Primary Care Teretha Chalupa: Jenell Milliner Other Clinician: Betha Loa Referring Jupiter Kabir: Jenell Milliner Treating Royalti Schauf/Extender: Rowan Blase in Treatment: 4 Active Inactive Wound/Skin Impairment Nursing Diagnoses: Knowledge deficit related to ulceration/compromised skin integrity Goals: Patient/caregiver will verbalize understanding of skin care regimen Date Initiated: 10/24/2021 Target Resolution Date: 11/24/2021 Goal Status: Active Ulcer/skin breakdown will have a volume  reduction of 30% by week 4 Date Initiated: 10/24/2021 Target Resolution Date: 11/24/2021 Goal Status: Active Ulcer/skin breakdown will have a volume reduction of 50% by week 8 Date Initiated: 10/24/2021 Target Resolution Date: 12/25/2021 Goal Status: Active Ulcer/skin breakdown will have a volume reduction of 80% by week 12 Date Initiated: 10/24/2021 Target Resolution Date: 01/24/2022 Goal Status: Active Ulcer/skin breakdown will heal within 14 weeks Date Initiated: 10/24/2021 Target Resolution Date: 02/24/2022 Goal Status: Active Interventions: Assess patient/caregiver ability to obtain necessary supplies Assess patient/caregiver ability to perform ulcer/skin care regimen upon admission and as needed Assess ulceration(s) every visit Notes: Electronic Signature(s) Unsigned Entered ByYevonne Pax on 11/24/2021 16:16:31 Signature(s): Date(s): JACQUELYNE, QUARRY ([id number]) -------------------------------------------------------------------------------- Pain Assessment Details Patient Name: Olivia Ramos, Olivia Ramos Date of Service: 11/24/2021 3:45 PM Medical Record Number: [id number] Patient Account Number: [id number] Date of Birth/Sex: 05/02/1955 (65 y.o. F) Treating RN: Yevonne Pax Primary Care Sonya Pucci: Jenell Milliner Other Clinician: Betha Loa Referring Cassara Nida: Jenell Milliner Treating Shakyla Nolley/Extender: Rowan Blase in Treatment: 4 Active Problems Location of Pain Severity and Description of Pain Patient Has Paino Yes Site Locations With Dressing Change: Yes Duration of the Pain. Constant / Intermittento Intermittent Rate the pain. Current Pain Level: 8 Worst Pain Level: 10 Least Pain Level: 0 Character of Pain Describe the Pain: Aching Pain Management and Medication Current Pain Management: Medication: Yes Cold Application: No Rest: Yes Massage: No Activity: No T.E.N.S.: No Heat Application: No Leg drop or elevation: No Is the Current Pain Management  Adequate: Inadequate How does your wound impact your activities of daily livingo Sleep: No Bathing: No Appetite: No Relationship With Others: No Bladder Continence: No Emotions: No Bowel Continence: No Work: No Toileting: No Drive: No Dressing: No Hobbies: No Electronic Signature(s) Unsigned Entered ByYevonne Pax on 11/24/2021 16:15:22 Signature(s): Date(s): Olivia Ramos, Olivia Ramos ([id number]) -------------------------------------------------------------------------------- Patient/Caregiver Education Details Patient Name: Olivia Ramos, Olivia Ramos Date of Service: 11/24/2021 3:45 PM Medical Record Number: [id number] Patient Account Number: [id number] Date of Birth/Gender: 1955/10/10 (65 y.o. F) Treating RN: Yevonne Pax Primary Care Physician: Jenell Milliner Other Clinician: Betha Loa Referring Physician: Jenell Milliner Treating Physician/Extender: Rowan Blase in Treatment: 4 Education Assessment Education Provided To: Patient Education Topics Provided Wound/Skin Impairment: Methods: Explain/Verbal Responses: State content correctly Electronic Signature(s) Unsigned Entered ByYevonne Pax on 11/24/2021 16:18:21 Signature(s): Date(s): Olivia Ramos, Olivia Ramos ([id number]) -------------------------------------------------------------------------------- Wound Assessment Details Patient Name: Olivia Ramos, Olivia Ramos Date of Service: 11/24/2021 3:45 PM Medical Record Number: [id number] Patient Account Number: [id number] Date of Birth/Sex: [birth year] (65 y.o. F) Treating RN: Yevonne Pax Primary Care Indigo Barbian: Jenell Milliner Other Clinician: Betha Loa Referring Larina Lieurance: Jenell Milliner Treating Kiwana Deblasi/Extender: Rowan Blase in  Treatment: 4 Wound Status Wound Number: 1 Primary Etiology: Infection - not elsewhere classified Wound Location: Left, Distal Hand - 3rd Digit Wound Status: Open Wounding Event: Gradually Appeared Comorbid History: Asthma, Hypertension, Type  II Diabetes Date Acquired: 08/04/2021 Weeks Of Treatment: 4 Clustered Wound: No Photos Wound Measurements Length: (cm) 1 Width: (cm) 0.8 Depth: (cm) 0.1 Area: (cm) 0.628 Volume: (cm) 0.063 % Reduction in Area: -300% % Reduction in Volume: -293.7% Epithelialization: None Tunneling: No Undermining: No Wound Description Classification: Full Thickness Without Exposed Support Structu Exudate Amount: Medium Exudate Type: Serosanguineous Exudate Color: red, brown res Foul Odor After Cleansing: No Slough/Fibrino Yes Wound Bed Granulation Amount: None Present (0%) Exposed Structure Necrotic Amount: Large (67-100%) Fascia Exposed: No Necrotic Quality: Eschar Fat Layer (Subcutaneous Tissue) Exposed: No Tendon Exposed: No Muscle Exposed: No Joint Exposed: No Bone Exposed: No Treatment Notes Wound #1 (Hand - 3rd Digit) Wound Laterality: Left, Distal Cleanser Peri-Wound Care Topical Santyl Collagenase Ointment, 30 (gm), tube Sabo, Kalise ([id number]) Discharge Instruction: apply nickel thick to wound bed only Primary Dressing Xeroform 4x4-HBD (in/in) Discharge Instruction: Apply Xeroform 4x4-HBD (in/in) as directed Secondary Dressing Coverlet Latex-Free Fabric Adhesive Dressings Discharge Instruction: 1.5 x 2 Secured With Compression Wrap Compression Stockings Add-Ons Electronic Signature(s) Unsigned Entered ByYevonne Pax on 11/24/2021 16:15:58 Signature(s): Date(s): BRIONY, PARVEEN ([id number]) -------------------------------------------------------------------------------- Vitals Details Patient Name: JAILIN, MANOCCHIO Date of Service: 11/24/2021 3:45 PM Medical Record Number: [id number] Patient Account Number: [id number] Date of Birth/Sex: 1955/08/17 (65 y.o. F) Treating RN: Yevonne Pax Primary Care Peightyn Roberson: Jenell Milliner Other Clinician: Betha Loa Referring Pasqualina Colasurdo: Jenell Milliner Treating Damany Eastman/Extender: Rowan Blase in Treatment:  4 Vital Signs Time Taken: 16:10 Temperature (F): 98.3 Height (in): 60 Pulse (bpm): 105 Weight (lbs): 148 Respiratory Rate (breaths/min): 16 Body Mass Index (BMI): 28.9 Blood Pressure (mmHg): 201/106 Reference Range: 80 - 120 mg / dl Electronic Signature(s) Unsigned Entered ByYevonne Pax on 11/24/2021 16:11:19 Signature(s): Date(s):

## 2021-11-25 ENCOUNTER — Other Ambulatory Visit: Payer: Self-pay | Admitting: Physician Assistant

## 2021-11-25 DIAGNOSIS — L03012 Cellulitis of left finger: Secondary | ICD-10-CM

## 2021-12-02 ENCOUNTER — Ambulatory Visit: Admission: RE | Admit: 2021-12-02 | Payer: BC Managed Care – PPO | Source: Ambulatory Visit

## 2021-12-09 ENCOUNTER — Ambulatory Visit: Payer: BC Managed Care – PPO | Admitting: Physician Assistant

## 2021-12-12 ENCOUNTER — Ambulatory Visit: Admission: RE | Admit: 2021-12-12 | Payer: BC Managed Care – PPO | Source: Ambulatory Visit

## 2021-12-18 ENCOUNTER — Encounter: Payer: BC Managed Care – PPO | Attending: Physician Assistant | Admitting: Physician Assistant

## 2021-12-18 DIAGNOSIS — E11622 Type 2 diabetes mellitus with other skin ulcer: Secondary | ICD-10-CM | POA: Diagnosis present

## 2021-12-18 DIAGNOSIS — I10 Essential (primary) hypertension: Secondary | ICD-10-CM | POA: Diagnosis not present

## 2021-12-18 DIAGNOSIS — L98492 Non-pressure chronic ulcer of skin of other sites with fat layer exposed: Secondary | ICD-10-CM | POA: Insufficient documentation

## 2021-12-18 DIAGNOSIS — L03012 Cellulitis of left finger: Secondary | ICD-10-CM | POA: Insufficient documentation

## 2021-12-18 NOTE — Progress Notes (Signed)
JAVAEH, MUSCATELLO ([id number]) Visit Report for 12/18/2021 Chief Complaint Document Details Patient Name: Olivia Ramos Date of Service: 12/18/2021 3:45 PM Medical Record Number: [id number] Patient Account Number: [id number] Date of Birth/Sex: 09-Jul-1955 (65 y.o. F) Treating RN: Huel Coventry Primary Care Provider: Jenell Milliner Other Clinician: Betha Loa Referring Provider: Jenell Milliner Treating Provider/Extender: Rowan Blase in Treatment: 7 Information Obtained from: Patient Chief Complaint Left 3rd finger ulcer Electronic Signature(s) Signed: 12/18/2021 3:54:43 PM By: Lenda Kelp PA-C Entered By: Lenda Kelp on 12/18/2021 15:54:43 [place], Johni ([id number]) -------------------------------------------------------------------------------- HPI Details Patient Name: Olivia Ramos Date of Service: 12/18/2021 3:45 PM Medical Record Number: [id number] Patient Account Number: [id number] Date of Birth/Sex: July 12, 1955 (65 y.o. F) Treating RN: Huel Coventry Primary Care Provider: Jenell Milliner Other Clinician: Betha Loa Referring Provider: Jenell Milliner Treating Provider/Extender: Rowan Blase in Treatment: 7 History of Present Illness HPI Description: 10-24-2021 upon evaluation today patient appears to be doing somewhat poorly in regard to a wound on her finger. This occurred after she was pulling on a hangnail. Subsequently this is something that seem to come up all of a sudden and has been extremely painful for her. Fortunately there does not appear to be any signs of active systemically though locally that is a different story. There is actually some eschar noted and it is extremely painful to touch she has been taking meloxicam which was helping to some degree. With that being said she has been on Keflex but that did not seem to help at all. The patient was referred to our office for further evaluation and treatment. She does have a history of  diabetes mellitus type 2 with hemoglobin A1c of 6.3 on 09-15-2021. 11-07-2021 upon evaluation today patient appears to be doing really a little bit worse in my opinion compared to last time I saw her. Fortunately I do not see any evidence of infection systemically but unfortunately she is still having what appears to be some infection locally. I do not think that there is any need for sending her to the ER or anything that drastic but I do believe switching her antibiotic and switch her over to [facility] as well should probably be the best way to go at this point. 11-14-2021 upon evaluation today patient appears to be still having quite a bit of an issue with her finger at this point. Fortunately I do not see any signs of infection currently which is good news. With that being said unfortunately I do think she still having a lot of pain. I do not feel like there is any worsening overall and in fact I feel like the redness is improved to some degree compared to what it was. She is still on the Levaquin which I put her on for 14 days starting last week. I also did actually go ahead and give her a prescription as well for Santyl but we have been having a hard time getting this approved. In the end it was denied by insurance. I gave her the co-pay card today so this should be only $80. She has had multiple x-rays of the hand which show no signs of osteomyelitis. She continues to have significant issues with pain and would like a referral to pain management. Unfortunately there is anyone in [place] who does pain management I am wondering if we can possibly see if Dr. Vear Clock in [facility] would be willing to take her on for a short amount of time while we get the wound healed  to try to help with the discomfort as this is continued to be an ongoing issue but unfortunately we do not prescribe any pain medications out of the wound care center. 11-24-2021 upon evaluation today patient appears to be  doing well currently in regard to her pain she tells me is a little bit better still stating to be an 8 out of 10. I am not certain that the redness has really improved much at all despite being placed on the Levaquin. She did go to the hospital the day after I saw her last in was given tramadol. With that being said she is still using the Santyl and has not heard from the pain clinic as of yet we did make that referral last week. They did do another x-ray of the ER and there is still no signs of osteomyelitis nonetheless considering how this is going and the pain that she has and I am going to go ahead and proceed with doing a MRI of the finger/hand in order to see if there is any evidence of osteomyelitis or other worsening at this point. Just does not seem to be progressing along the right track but I was hoping for as far as clearing away the eschar and hoping to get this to heal. 12-18-2021 upon evaluation today patient appears to be doing better in regard to her finger ulcer. This is actually showing signs of improvement which is great news there does not appear to be any signs of infection also good news. Fortunately I think she is on the right track here the Levaquin does seem to have taken out the infection which I believe in turn has helped tremendously with her pain. Electronic Signature(s) Signed: 12/18/2021 4:35:37 PM By: Lenda KelpStone III, Ransom Nickson PA-C Entered By: Lenda KelpStone III, Khaya Theissen on 12/18/2021 16:35:36 Olivia Ramos ([id number]) -------------------------------------------------------------------------------- Physical Exam Details Patient Name: Olivia Ramos Date of Service: 12/18/2021 3:45 PM Medical Record Number: [id number] Patient Account Number: [id number] Date of Birth/Sex: 12/14/1955 (65 y.o. F) Treating RN: Huel CoventryWoody, Kim Primary Care Provider: Jenell MillinerLuyando, Yvonne Other Clinician: Betha LoaVenable, Angie Referring Provider: Jenell MillinerLuyando, Yvonne Treating Provider/Extender: Rowan BlaseStone, Cedra Villalon Weeks in  Treatment: 7 Constitutional Well-nourished and well-hydrated in no acute distress. Respiratory normal breathing without difficulty. Psychiatric this patient is able to make decisions and demonstrates good insight into disease process. Alert and Oriented x 3. pleasant and cooperative. Notes Upon inspection patient's wound bed actually showed signs of good granulation epithelization at this point. Fortunately I do not see any evidence of infection locally or systemically which is great news and overall I am extremely pleased. She does have a lot of eschar still covering the surface of the wound but the redness is better Electronic Signature(s) Signed: 12/18/2021 4:36:03 PM By: Lenda KelpStone III, Jaydalyn Demattia PA-C Entered By: Lenda KelpStone III, Dawnmarie Breon on 12/18/2021 16:36:03 [place]DE[place], Mark[place] ([id number]) -------------------------------------------------------------------------------- Physician Orders Details Patient Name: Olivia Ramos Date of Service: 12/18/2021 3:45 PM Medical Record Number: [id number] Patient Account Number: [id number] Date of Birth/Sex: 12/14/1955 (65 y.o. F) Treating RN: Huel CoventryWoody, Kim Primary Care Provider: Jenell MillinerLuyando, Yvonne Other Clinician: Betha LoaVenable, Angie Referring Provider: Jenell MillinerLuyando, Yvonne Treating Provider/Extender: Rowan BlaseStone, Jaesean Litzau Weeks in Treatment: 7 Verbal / Phone Orders: No Diagnosis Coding ICD-10 Coding Code Description L03.012 Cellulitis of left finger L98.492 Non-pressure chronic ulcer of skin of other sites with fat layer exposed E11.622 Type 2 diabetes mellitus with other skin ulcer I10 Essential (primary) hypertension Follow-up Appointments o Return Appointment in 2 weeks. Bathing/ Shower/ Hygiene o May shower; gently cleanse wound with antibacterial soap, rinse  and pat dry prior to dressing wounds Medications-Please add to medication list. o Take one 500mg  Tylenol (Acetaminophen) and one 200mg  Motrin (Ibuprofen) every 6 hours for pain. Do not take ibuprofen if you are on  blood thinners or have stomach ulcers. - Tylenol for pain o P.O. Antibiotics - Continue Levaquin until finished o Santyl Enzymatic Ointment - apply small amount to wound bed, then cover with a small piece of yellow Xeroform, then cover with a bandaid only change dressing once daily Wound Treatment Wound #1 - Hand - 3rd Digit Wound Laterality: Left, Distal Topical: Santyl Collagenase Ointment, 30 (gm), tube 1 x Per Day/30 Days Discharge Instructions: apply nickel thick to wound bed only Primary Dressing: Xeroform 4x4-HBD (in/in) 1 x Per Day/30 Days Discharge Instructions: Apply Xeroform 4x4-HBD (in/in) as directed Secondary Dressing: Coverlet Latex-Free Fabric Adhesive Dressings 1 x Per Day/30 Days Discharge Instructions: 1.5 x 2 Electronic Signature(s) Signed: 12/18/2021 4:51:32 PM By: PA-C Signed: 12/22/2021 9:44:14 AM By: Lenda Kelp Entered By: 12/24/2021 on 12/18/2021 16:15:44 Ramos, Olivia (Betha Loa) -------------------------------------------------------------------------------- Problem List Details Patient Name: 12/20/2021 Date of Service: 12/18/2021 3:45 PM Medical Record Number: Olivia Ramos Patient Account Number: 12/20/2021 Date of Birth/Sex: 05/17/1955 (65 y.o. F) Treating RN: 02/22/1956 Primary Care Provider: 07-11-1999 Other Clinician: Huel Coventry Referring Provider: Jenell Milliner Treating Provider/Extender: Betha Loa in Treatment: 7 Active Problems ICD-10 Encounter Code Description Active Date MDM Diagnosis L03.012 Cellulitis of left finger 10/24/2021 No Yes L98.492 Non-pressure chronic ulcer of skin of other sites with fat layer exposed 10/24/2021 No Yes E11.622 Type 2 diabetes mellitus with other skin ulcer 10/24/2021 No Yes I10 Essential (primary) hypertension 10/24/2021 No Yes Inactive Problems Resolved Problems Electronic Signature(s) Signed: 12/18/2021 3:54:40 PM By: 10/26/2021 PA-C Entered By: 12/20/2021 on 12/18/2021 15:54:40 [place], Olivia Ramos (12/20/2021) -------------------------------------------------------------------------------- Progress Note Details Patient Name: Olivia Ramos Date of Service: 12/18/2021 3:45 PM Medical Record Number: Olivia Ramos Patient Account Number: 12/20/2021 Date of Birth/Sex: [birth year] (65 y.o. F) Treating RN: 02/22/1956 Primary Care Provider: 07-11-1999 Other Clinician: Huel Coventry Referring Provider: Jenell Milliner Treating Provider/Extender: Betha Loa in Treatment: 7 Subjective Chief Complaint Information obtained from Patient Left 3rd finger ulcer History of Present Illness (HPI) 10-24-2021 upon evaluation today patient appears to be doing somewhat poorly in regard to a wound on her finger. This occurred after she was pulling on a hangnail. Subsequently this is something that seem to come up all of a sudden and has been extremely painful for her. Fortunately there does not appear to be any signs of active systemically though locally that is a different story. There is actually some eschar noted and it is extremely painful to touch she has been taking meloxicam which was helping to some degree. With that being said she has been on Keflex but that did not seem to help at all. The patient was referred to our office for further evaluation and treatment. She does have a history of diabetes mellitus type 2 with hemoglobin A1c of 6.3 on 09-15-2021. 11-07-2021 upon evaluation today patient appears to be doing really a little bit worse in my opinion compared to last time I saw her. Fortunately I do not see any evidence of infection systemically but unfortunately she is still having what appears to be some infection locally. I do not think that there is any need for sending her to the ER or anything that drastic but I do believe switching her antibiotic and switch her  over to [facility] as well should probably be the best way to go at this  point. 11-14-2021 upon evaluation today patient appears to be still having quite a bit of an issue with her finger at this point. Fortunately I do not see any signs of infection currently which is good news. With that being said unfortunately I do think she still having a lot of pain. I do not feel like there is any worsening overall and in fact I feel like the redness is improved to some degree compared to what it was. She is still on the Levaquin which I put her on for 14 days starting last week. I also did actually go ahead and give her a prescription as well for Santyl but we have been having a hard time getting this approved. In the end it was denied by insurance. I gave her the co-pay card today so this should be only $80. She has had multiple x-rays of the hand which show no signs of osteomyelitis. She continues to have significant issues with pain and would like a referral to pain management. Unfortunately there is anyone in [place] who does pain management I am wondering if we can possibly see if Dr. Vear Clock in [facility] would be willing to take her on for a short amount of time while we get the wound healed to try to help with the discomfort as this is continued to be an ongoing issue but unfortunately we do not prescribe any pain medications out of the wound care center. 11-24-2021 upon evaluation today patient appears to be doing well currently in regard to her pain she tells me is a little bit better still stating to be an 8 out of 10. I am not certain that the redness has really improved much at all despite being placed on the Levaquin. She did go to the hospital the day after I saw her last in was given tramadol. With that being said she is still using the Santyl and has not heard from the pain clinic as of yet we did make that referral last week. They did do another x-ray of the ER and there is still no signs of osteomyelitis nonetheless considering how this is going  and the pain that she has and I am going to go ahead and proceed with doing a MRI of the finger/hand in order to see if there is any evidence of osteomyelitis or other worsening at this point. Just does not seem to be progressing along the right track but I was hoping for as far as clearing away the eschar and hoping to get this to heal. 12-18-2021 upon evaluation today patient appears to be doing better in regard to her finger ulcer. This is actually showing signs of improvement which is great news there does not appear to be any signs of infection also good news. Fortunately I think she is on the right track here the Levaquin does seem to have taken out the infection which I believe in turn has helped tremendously with her pain. Objective Constitutional Well-nourished and well-hydrated in no acute distress. Vitals Time Taken: 3:58 PM, Height: 60 in, Weight: 148 lbs, BMI: 28.9, Temperature: 98.2 F, Pulse: 91 bpm, Respiratory Rate: 18 breaths/min, Blood Pressure: 171/94 mmHg. Respiratory normal breathing without difficulty. Psychiatric this patient is able to make decisions and demonstrates good insight into disease process. Alert and Oriented x 3. pleasant and cooperative. [place], Olivia Ramos ([id number]) General Notes: Upon inspection patient's wound bed actually  showed signs of good granulation epithelization at this point. Fortunately I do not see any evidence of infection locally or systemically which is great news and overall I am extremely pleased. She does have a lot of eschar still covering the surface of the wound but the redness is better Integumentary (Hair, Skin) Wound #1 status is Open. Original cause of wound was Gradually Appeared. The date acquired was: 08/04/2021. The wound has been in treatment 7 weeks. The wound is located on the Left,Distal Hand - 3rd Digit. The wound measures 1.5cm length x 0.7cm width x 0.1cm depth; 0.825cm^2 area and 0.082cm^3 volume. There is a medium amount  of serosanguineous drainage noted. There is no granulation within the wound bed. There is a large (67-100%) amount of necrotic tissue within the wound bed including Eschar. Assessment Active Problems ICD-10 Cellulitis of left finger Non-pressure chronic ulcer of skin of other sites with fat layer exposed Type 2 diabetes mellitus with other skin ulcer Essential (primary) hypertension Plan Follow-up Appointments: Return Appointment in 2 weeks. Bathing/ Shower/ Hygiene: May shower; gently cleanse wound with antibacterial soap, rinse and pat dry prior to dressing wounds Medications-Please add to medication list.: Take one  Tylenol (Acetaminophen) and one  Motrin (Ibuprofen) every 6 hours for pain. Do not take ibuprofen if you are on blood thinners or have stomach ulcers. - Tylenol for pain P.O. Antibiotics - Continue Levaquin until finished Santyl Enzymatic Ointment - apply small amount to wound bed, then cover with a small piece of yellow Xeroform, then cover with a bandaid only change dressing once daily WOUND #1: - Hand - 3rd Digit Wound Laterality: Left, Distal Topical: Santyl Collagenase Ointment, 30 (gm), tube 1 x Per Day/30 Days Discharge Instructions: apply nickel thick to wound bed only Primary Dressing: Xeroform 4x4-HBD (in/in) 1 x Per Day/30 Days Discharge Instructions: Apply Xeroform 4x4-HBD (in/in) as directed Secondary Dressing: Coverlet Latex-Free Fabric Adhesive Dressings 1 x Per Day/30 Days Discharge Instructions: 1.5 x 2 1. Recommend currently that we have the patient go ahead continue the Santyl followed by the Xeroform gauze dressing. 2. She will continue to cover this with a coverlet. 3. I am also going to recommend that she continue to monitor for any signs of worsening or infection if anything changes she should contact the office and let me know. We will see patient back for reevaluation in 1 week here in the clinic. If anything worsens or changes patient  will contact our office for additional recommendations. Electronic Signature(s) Signed: 12/18/2021 4:38:16 PM By: Lenda Kelp PA-C Entered By: Lenda Kelp on 12/18/2021 16:38:16 [place], Olivia Ramos ([id number]) -------------------------------------------------------------------------------- SuperBill Details Patient Name: Olivia Ramos Date of Service: 12/18/2021 Medical Record Number: [id number] Patient Account Number: [id number] Date of Birth/Sex: [birth year] (65 y.o. F) Treating RN: Huel Coventry Primary Care Provider: Jenell Milliner Other Clinician: Betha Loa Referring Provider: Jenell Milliner Treating Provider/Extender: Rowan Blase in Treatment: 7 Diagnosis Coding ICD-10 Codes Code Description L03.012 Cellulitis of left finger L98.492 Non-pressure chronic ulcer of skin of other sites with fat layer exposed E11.622 Type 2 diabetes mellitus with other skin ulcer I10 Essential (primary) hypertension Facility Procedures CPT4 Code: [id number] Description: 99213 - WOUND CARE VISIT-LEV 3 EST PT Modifier: Quantity: 1 Physician Procedures CPT4 Code: [id number] Description: 99214 - WC PHYS LEVEL 4 - EST PT Modifier: Quantity: 1 CPT4 Code: Description: ICD-10 Diagnosis Description L03.012 Cellulitis of left finger L98.492 Non-pressure chronic ulcer of skin of other sites with fat layer expos E11.622 Type 2 diabetes mellitus with  other skin ulcer I10 Essential (primary) hypertension Modifier: ed Quantity: Electronic Signature(s) Signed: 12/18/2021 4:38:32 PM By: Lenda Kelp PA-C Entered By: Lenda Kelp on 12/18/2021 16:38:31

## 2021-12-18 NOTE — Progress Notes (Signed)
NATEISHA, MOYD ([id number]) Visit Report for 12/18/2021 Arrival Information Details Patient Name: Olivia Ramos, Olivia Ramos Date of Service: 12/18/2021 3:45 PM Medical Record Number: [id number] Patient Account Number: [id number] Date of Birth/Sex: Aug 06, 1955 (65 y.o. F) Treating RN: Huel Coventry Primary Care Jachai Okazaki: Jenell Milliner Other Clinician: Betha Loa Referring Latisha Lasch: Jenell Milliner Treating Auri Jahnke/Extender: Rowan Blase in Treatment: 7 Visit Information History Since Last Visit All ordered tests and consults were completed: No Patient Arrived: Ambulatory Added or deleted any medications: No Arrival Time: 15:56 Any new allergies or adverse reactions: No Transfer Assistance: None Had a fall or experienced change in No Patient Requires Transmission-Based Precautions: No activities of daily living that may affect Patient Has Alerts: No risk of falls: Signs or symptoms of abuse/neglect since last visito No Hospitalized since last visit: No Pain Present Now: Yes Electronic Signature(s) Unsigned Entered ByBetha Loa on 12/18/2021 15:58:27 Signature(s): Date(s): Olivia Ramos, Olivia Ramos ([id number]) -------------------------------------------------------------------------------- Clinic Level of Care Assessment Details Patient Name: JUDIANNE, SEIPLE Date of Service: 12/18/2021 3:45 PM Medical Record Number: [id number] Patient Account Number: [id number] Date of Birth/Sex: January 02, 1956 (65 y.o. F) Treating RN: Huel Coventry Primary Care Rayshun Kandler: Jenell Milliner Other Clinician: Betha Loa Referring Kayle Correa: Jenell Milliner Treating Jaydy Fitzhenry/Extender: Rowan Blase in Treatment: 7 Clinic Level of Care Assessment Items TOOL 4 Quantity Score []  - Use when only an EandM is performed on FOLLOW-UP visit 0 ASSESSMENTS - Nursing Assessment / Reassessment X - Reassessment of Co-morbidities (includes updates in patient status) 1 10 X- 1 5 Reassessment of Adherence to  Treatment Plan ASSESSMENTS - Wound and Skin Assessment / Reassessment X - Simple Wound Assessment / Reassessment - one wound 1 5 []  - 0 Complex Wound Assessment / Reassessment - multiple wounds []  - 0 Dermatologic / Skin Assessment (not related to wound area) ASSESSMENTS - Focused Assessment []  - Circumferential Edema Measurements - multi extremities 0 []  - 0 Nutritional Assessment / Counseling / Intervention []  - 0 Lower Extremity Assessment (monofilament, tuning fork, pulses) []  - 0 Peripheral Arterial Disease Assessment (using hand held doppler) ASSESSMENTS - Ostomy and/or Continence Assessment and Care []  - Incontinence Assessment and Management 0 []  - 0 Ostomy Care Assessment and Management (repouching, etc.) PROCESS - Coordination of Care X - Simple Patient / Family Education for ongoing care 1 15 []  - 0 Complex (extensive) Patient / Family Education for ongoing care []  - 0 Staff obtains Consents, Records, Test Results / Process Orders []  - 0 Staff telephones HHA, Nursing Homes / Clarify orders / etc []  - 0 Routine Transfer to another Facility (non-emergent condition) []  - 0 Routine Hospital Admission (non-emergent condition) []  - 0 New Admissions / / Ordering NPWT, Apligraf, etc. []  - 0 Emergency Hospital Admission (emergent condition) X- 1 10 Simple Discharge Coordination []  - 0 Complex (extensive) Discharge Coordination PROCESS - Special Needs []  - Pediatric / Minor Patient Management 0 []  - 0 Isolation Patient Management []  - 0 Hearing / Language / Visual special needs []  - 0 Assessment of Community assistance (transportation, D/C planning, etc.) []  - 0 Additional assistance / Altered mentation []  - 0 Support Surface(s) Assessment (bed, cushion, seat, etc.) INTERVENTIONS - Wound Cleansing / Measurement Doffing, Chalice ( ) X- 1 5 Simple Wound Cleansing - one wound []  - 0 Complex Wound Cleansing - multiple wounds X- 1  5 Wound Imaging (photographs - any number of wounds) []  - 0 Wound Tracing (instead of photographs) X- 1 5 Simple Wound Measurement - one wound []  - 0 Complex Wound Measurement -  multiple wounds INTERVENTIONS - Wound Dressings []  - Small Wound Dressing one or multiple wounds 0 X- 1 15 Medium Wound Dressing one or multiple wounds []  - 0 Large Wound Dressing one or multiple wounds X- 1 5 Application of Medications - topical []  - 0 Application of Medications - injection INTERVENTIONS - Miscellaneous []  - External ear exam 0 []  - 0 Specimen Collection (cultures, biopsies, blood, body fluids, etc.) []  - 0 Specimen(s) / Culture(s) sent or taken to Lab for analysis []  - 0 Patient Transfer (multiple staff / / Similar devices) []  - 0 Simple Staple / Suture removal (25 or less) []  - 0 Complex Staple / Suture removal (26 or more) []  - 0 Hypo / Hyperglycemic Management (close monitor of Blood Glucose) []  - 0 Ankle / Brachial Index (ABI) - do not check if billed separately X- 1 5 Vital Signs Has the patient been seen at the hospital within the last three years: Yes Total Score: 85 Level Of Care: New/Established - Level 3 Electronic Signature(s) Unsigned Entered By: on 12/18/2021 16:16:14 Signature(s): Date(s): Olivia Ramos, Olivia Ramos ( ) -------------------------------------------------------------------------------- Encounter Discharge Information Details Patient Name: Olivia Ramos, Olivia Ramos Date of Service: 12/18/2021 3:45 PM Medical Record Number: Nurse, adult Patient Account Number: Date of Birth/Sex: 1955-08-03 (65 y.o. F) Treating RN: Primary Care Kashmir Leedy: Betha Loa Other Clinician: 12/20/2021 Referring Peni Rupard: [name] Treating Bernedette Auston/Extender: [id number] in Treatment: 7 Encounter Discharge Information Items Discharge Condition: Stable Ambulatory Status: Ambulatory Discharge Destination:  Home Transportation: Private Auto Accompanied By: self Schedule Follow-up Appointment: Yes Clinical Summary of Care: Electronic Signature(s) Unsigned Entered By: [name] on 12/18/2021 16:26:52 Signature(s): Date(s): JALILA, GOODNOUGH ([id number]) -------------------------------------------------------------------------------- Lower Extremity Assessment Details Patient Name: Olivia Ramos, Olivia Ramos Date of Service: 12/18/2021 3:45 PM Medical Record Number: Huel Coventry Patient Account Number: Jenell Milliner Date of Birth/Sex: 06-02-1955 (65 y.o. F) Treating RN: Rowan Blase Primary Care Aella Ronda: Betha Loa Other Clinician: 12/20/2021 Referring Miasha Emmons: [name] Treating Alen Matheson/Extender: [id number] in Treatment: 7 Electronic Signature(s) Unsigned Entered By: [name] on 12/18/2021 16:03:29 Signature(s): Date(s): ANDRES, ESCANDON ([id number]) -------------------------------------------------------------------------------- Multi Wound Chart Details Patient Name: Olivia Ramos, Olivia Ramos Date of Service: 12/18/2021 3:45 PM Medical Record Number: Huel Coventry Patient Account Number: Jenell Milliner Date of Birth/Sex: 02-16-1956 (65 y.o. F) Treating RN: Rowan Blase Primary Care Victoria Henshaw: Betha Loa Other Clinician: 12/20/2021 Referring Darlen Gledhill: [name] Treating Zayah Keilman/Extender: [id number] in Treatment: 7 Vital Signs Height(in): 60 Pulse(bpm): 91 Weight(lbs): 148 Blood Pressure(mmHg): 171/94 Body Mass Index(BMI): 28.9 Temperature(F): 98.2 Respiratory Rate(breaths/min): 18 Photos: [N/A:N/A] Wound Location: Left, Distal Hand - 3rd Digit N/A N/A Wounding Event: Gradually Appeared N/A N/A Primary Etiology: Infection - not elsewhere classified N/A N/A Comorbid History: Asthma, Hypertension, Type II N/A N/A Diabetes Date Acquired: 08/04/2021 N/A N/A Weeks of Treatment: 7 N/A N/A Wound Status: Open N/A N/A Wound Recurrence: No N/A  N/A Measurements L x W x D (cm) 1.5x0.7x0.1 N/A N/A Area (cm) : 0.825 N/A N/A Volume (cm) : 0.082 N/A N/A % Reduction in Area: -425.50% N/A N/A % Reduction in Volume: -412.50% N/A N/A Classification: Full Thickness Without Exposed N/A N/A Support Structures Exudate Amount: Medium N/A N/A Exudate Type: Serosanguineous N/A N/A Exudate Color: red, brown N/A N/A Granulation Amount: None Present (0%) N/A N/A Necrotic Amount: Large (67-100%) N/A N/A Necrotic Tissue: Eschar N/A N/A Exposed Structures: Fascia: No N/A N/A Fat Layer (Subcutaneous Tissue): No Tendon: No Muscle: No Joint: No Bone: No Epithelialization: None N/A N/A Treatment Notes Electronic Signature(s) Unsigned Entered  By: Massie Kluver on 12/18/2021 16:03:38 Signature(s): Date(s): WILENA, FLOCCO ([id number], ACQUISTO ([id number]) -------------------------------------------------------------------------------- Multi-Disciplinary Care Plan Details Patient Name: Olivia Ramos, Olivia Ramos Date of Service: 12/18/2021 3:45 PM Medical Record Number: [id number] Patient Account Number: [id number] Date of Birth/Sex: Nov 21, 1955 (65 y.o. F) Treating RN: Cornell Barman Primary Care Ansleigh Safer: Danae Orleans Other Clinician: Massie Kluver Referring Antoinetta Berrones: Danae Orleans Treating Melford Tullier/Extender: Skipper Cliche in Treatment: 7 Active Inactive Wound/Skin Impairment Nursing Diagnoses: Knowledge deficit related to ulceration/compromised skin integrity Goals: Patient/caregiver will verbalize understanding of skin care regimen Date Initiated: 10/24/2021 Target Resolution Date: 11/24/2021 Goal Status: Active Ulcer/skin breakdown will have a volume reduction of 30% by week 4 Date Initiated: 10/24/2021 Target Resolution Date: 11/24/2021 Goal Status: Active Ulcer/skin breakdown will have a volume reduction of 50% by week 8 Date Initiated: 10/24/2021 Target Resolution Date: 12/25/2021 Goal Status: Active Ulcer/skin  breakdown will have a volume reduction of 80% by week 12 Date Initiated: 10/24/2021 Target Resolution Date: 01/24/2022 Goal Status: Active Ulcer/skin breakdown will heal within 14 weeks Date Initiated: 10/24/2021 Target Resolution Date: 02/24/2022 Goal Status: Active Interventions: Assess patient/caregiver ability to obtain necessary supplies Assess patient/caregiver ability to perform ulcer/skin care regimen upon admission and as needed Assess ulceration(s) every visit Notes: Electronic Signature(s) Unsigned Entered By: Massie Kluver on 12/18/2021 16:03:33 Signature(s): Date(s): Olivia Ramos, Olivia Ramos ([id number]) -------------------------------------------------------------------------------- Pain Assessment Details Patient Name: Olivia Ramos, Olivia Ramos Date of Service: 12/18/2021 3:45 PM Medical Record Number: [id number] Patient Account Number: [id number] Date of Birth/Sex: October 12, 1955 (65 y.o. F) Treating RN: Cornell Barman Primary Care Blanca Thornton: Danae Orleans Other Clinician: Massie Kluver Referring Evan Osburn: Danae Orleans Treating Rio Kidane/Extender: Skipper Cliche in Treatment: 7 Active Problems Location of Pain Severity and Description of Pain Patient Has Paino Yes Site Locations Pain Location: Pain in Ulcers Duration of the Pain. Constant / Intermittento Constant Rate the pain. Current Pain Level: 4 Character of Pain Describe the Pain: Burning Pain Management and Medication Current Pain Management: Medication: Yes Rest: Yes Electronic Signature(s) Unsigned Entered ByMassie Kluver on 12/18/2021 15:59:16 Signature(s): Date(s): Olivia Ramos, Olivia Ramos ([id number]) -------------------------------------------------------------------------------- Patient/Caregiver Education Details Patient Name: Olivia Ramos, Olivia Ramos Date of Service: 12/18/2021 3:45 PM Medical Record Number: [id number] Patient Account Number: [id number] Date of Birth/Gender: Dec 26, 1955 (65 y.o. F) Treating RN:  Cornell Barman Primary Care Physician: Danae Orleans Other Clinician: Massie Kluver Referring Physician: Danae Orleans Treating Physician/Extender: Skipper Cliche in Treatment: 7 Education Assessment Education Provided To: Patient Education Topics Provided Wound/Skin Impairment: Handouts: Other: continue wound care as directed Methods: Explain/Verbal Responses: State content correctly Electronic Signature(s) Unsigned Entered By: Massie Kluver on 12/18/2021 16:16:41 Signature(s): Date(s): RITHA, KINSLOW ([id number]) -------------------------------------------------------------------------------- Wound Assessment Details Patient Name: Olivia Ramos, Olivia Ramos Date of Service: 12/18/2021 3:45 PM Medical Record Number: [id number] Patient Account Number: [id number] Date of Birth/Sex: 1956/02/15 (65 y.o. F) Treating RN: Cornell Barman Primary Care Musab Wingard: Danae Orleans Other Clinician: Massie Kluver Referring Laelani Vasko: Danae Orleans Treating Iyari Hagner/Extender: Skipper Cliche in Treatment: 7 Wound Status Wound Number: 1 Primary Etiology: Infection - not elsewhere classified Wound Location: Left, Distal Hand - 3rd Digit Wound Status: Open Wounding Event: Gradually Appeared Comorbid History: Asthma, Hypertension, Type II Diabetes Date Acquired: 08/04/2021 Weeks Of Treatment: 7 Clustered Wound: No Photos Wound Measurements Length: (cm) 1.5 Width: (cm) 0.7 Depth: (cm) 0.1 Area: (cm) 0.825 Volume: (cm) 0.082 % Reduction in Area: -425.5% % Reduction in Volume: -412.5% Epithelialization: None Wound Description Classification: Full Thickness Without Exposed Support Structu Exudate Amount: Medium Exudate Type: Serosanguineous Exudate Color: red, brown res Foul Odor After Cleansing: No Slough/Fibrino Yes Wound Bed  Granulation Amount: None Present (0%) Exposed Structure Necrotic Amount: Large (67-100%) Fascia Exposed: No Necrotic Quality: Eschar Fat Layer  (Subcutaneous Tissue) Exposed: No Tendon Exposed: No Muscle Exposed: No Joint Exposed: No Bone Exposed: No Treatment Notes Wound #1 (Hand - 3rd Digit) Wound Laterality: Left, Distal Cleanser Peri-Wound Care Topical Santyl Collagenase Ointment, 30 (gm), tube Dimock, Janiylah ([id number]) Discharge Instruction: apply nickel thick to wound bed only Primary Dressing Xeroform 4x4-HBD (in/in) Discharge Instruction: Apply Xeroform 4x4-HBD (in/in) as directed Secondary Dressing Coverlet Latex-Free Fabric Adhesive Dressings Discharge Instruction: 1.5 x 2 Secured With Compression Wrap Compression Stockings Add-Ons Electronic Signature(s) Unsigned Entered ByMassie Kluver on 12/18/2021 16:03:15 Signature(s): Date(s): ARALYNN, LOSEKE ([id number]) -------------------------------------------------------------------------------- Vitals Details Patient Name: SABIAN, SPROW Date of Service: 12/18/2021 3:45 PM Medical Record Number: [id number] Patient Account Number: [id number] Date of Birth/Sex: 08-27-1955 (65 y.o. F) Treating RN: Cornell Barman Primary Care Yumiko Alkins: Danae Orleans Other Clinician: Massie Kluver Referring Mellony Danziger: Danae Orleans Treating Fatema Rabe/Extender: Skipper Cliche in Treatment: 7 Vital Signs Time Taken: 15:58 Temperature (F): 98.2 Height (in): 60 Pulse (bpm): 91 Weight (lbs): 148 Respiratory Rate (breaths/min): 18 Body Mass Index (BMI): 28.9 Blood Pressure (mmHg): 171/94 Reference Range: 80 - 120 mg / dl Electronic Signature(s) Unsigned Entered ByMassie Kluver on 12/18/2021 15:58:51 Signature(s): Date(s):

## 2021-12-26 ENCOUNTER — Encounter: Payer: BC Managed Care – PPO | Admitting: Physician Assistant

## 2021-12-26 DIAGNOSIS — E11622 Type 2 diabetes mellitus with other skin ulcer: Secondary | ICD-10-CM | POA: Diagnosis not present

## 2021-12-26 NOTE — Progress Notes (Signed)
Olivia Ramos, Lekia ([id number]) Visit Report for 12/26/2021 Chief Complaint Document Details Patient Name: Olivia Ramos, Jaycelyn Date of Service: 12/26/2021 11:30 AM Medical Record Number: [id number] Patient Account Number: [id number] Date of Birth/Sex: [birth year] (66 y.o. F) Treating RN: Huel CoventryWoody, Kim Primary Care Provider: Jenell MillinerLuyando, Yvonne Other Clinician: Referring Provider: Jenell MillinerLuyando, Yvonne Treating Provider/Extender: Rowan BlaseStone, Aiken Withem Weeks in Treatment: 9 Information Obtained from: Patient Chief Complaint Left 3rd finger ulcer Electronic Signature(s) Signed: 12/26/2021 11:44:07 AM By: Lenda KelpStone III, Odyssey Vasbinder PA-C Entered By: Lenda KelpStone III, Momoko Slezak on 12/26/2021 11:44:07 SalisburyHERNANDEZ, Mamye ([id number]) -------------------------------------------------------------------------------- Debridement Details Patient Name: Olivia Ramos, [name] Date of Service: 12/26/2021 11:30 AM Medical Record Number: [id number] Patient Account Number: [id number] Date of Birth/Sex: [birth year] (66 y.o. F) Treating RN: Huel CoventryWoody, Kim Primary Care Provider: Jenell MillinerLuyando, Yvonne Other Clinician: Referring Provider: Jenell MillinerLuyando, Yvonne Treating Provider/Extender: Rowan BlaseStone, Merissa Renwick Weeks in Treatment: 9 Debridement Performed for Wound #1 Left,Distal Hand - 3rd Digit Assessment: Performed By: Physician Nelida MeuseStone, Doreena Maulden E., PA-C Debridement Type: Debridement Level of Consciousness (Pre- Awake and Alert procedure): Pre-procedure Verification/Time Out Yes - 12:15 Taken: Total Area Debrided (L x W): 1.2 (cm) x 0.7 (cm) = 0.84 (cm) Tissue and other material Viable, Non-Viable, Slough, Subcutaneous, Skin: Dermis , Skin: Epidermis, Slough debrided: Level: Skin/Subcutaneous Tissue Debridement Description: Excisional Instrument: Curette Bleeding: Moderate Hemostasis Achieved: Silver Nitrate Response to Treatment: Procedure was tolerated well Level of Consciousness (Post- Awake and Alert procedure): Post Debridement Measurements of Total Wound Length: (cm)  1.2 Width: (cm) 0.8 Depth: (cm) 0.3 Volume: (cm) 0.226 Character of Wound/Ulcer Post Debridement: Stable Post Procedure Diagnosis Same as Pre-procedure Electronic Signature(s) Signed: 12/26/2021 1:36:52 PM By: Lenda KelpStone III, Arne Schlender PA-C Signed: 12/29/2021 8:05:30 AM By: Elliot GurneyWoody, BSN, RN, CWS, Kim RN, BSN Entered By: Elliot GurneyWoody, BSN, RN, CWS, Kim on 12/26/2021 12:36:06 Olivia Ramos, Zabria ([id number]) -------------------------------------------------------------------------------- HPI Details Patient Name: Olivia Ramos, [name] Date of Service: 12/26/2021 11:30 AM Medical Record Number: [id number] Patient Account Number: [id number] Date of Birth/Sex: [birth year] (66 y.o. F) Treating RN: Huel CoventryWoody, Kim Primary Care Provider: Jenell MillinerLuyando, Yvonne Other Clinician: Referring Provider: Jenell MillinerLuyando, Yvonne Treating Provider/Extender: Rowan BlaseStone, Tiffanie Blassingame Weeks in Treatment: 9 History of Present Illness HPI Description: 10-24-2021 upon evaluation today patient appears to be doing somewhat poorly in regard to a wound on her finger. This occurred after she was pulling on a hangnail. Subsequently this is something that seem to come up all of a sudden and has been extremely painful for her. Fortunately there does not appear to be any signs of active systemically though locally that is a different story. There is actually some eschar noted and it is extremely painful to touch she has been taking meloxicam which was helping to some degree. With that being said she has been on Keflex but that did not seem to help at all. The patient was referred to our office for further evaluation and treatment. She does have a history of diabetes mellitus type 2 with hemoglobin A1c of 6.3 on 09-15-2021. 11-07-2021 upon evaluation today patient appears to be doing really a little bit worse in my opinion compared to last time I saw her. Fortunately I do not see any evidence of infection systemically but unfortunately she is still having what appears to be some  infection locally. I do not think that there is any need for sending her to the ER or anything that drastic but I do believe switching her antibiotic and switch her over to [facility] as well should probably be the best way to go at this point. 11-14-2021 upon evaluation today patient appears to be still  having quite a bit of an issue with her finger at this point. Fortunately I do not see any signs of infection currently which is good news. With that being said unfortunately I do think she still having a lot of pain. I do not feel like there is any worsening overall and in fact I feel like the redness is improved to some degree compared to what it was. She is still on the Levaquin which I put her on for 14 days starting last week. I also did actually go ahead and give her a prescription as well for Santyl but we have been having a hard time getting this approved. In the end it was denied by insurance. I gave her the co-pay card today so this should be only $80. She has had multiple x-rays of the hand which show no signs of osteomyelitis. She continues to have significant issues with pain and would like a referral to pain management. Unfortunately there is anyone in [place] who does pain management I am wondering if we can possibly see if Dr. Hardin Negus in [facility] would be willing to take her on for a short amount of time while we get the wound healed to try to help with the discomfort as this is continued to be an ongoing issue but unfortunately we do not prescribe any pain medications out of the wound care center. 11-24-2021 upon evaluation today patient appears to be doing well currently in regard to her pain she tells me is a little bit better still stating to be an 8 out of 10. I am not certain that the redness has really improved much at all despite being placed on the Levaquin. She did go to the hospital the day after I saw her last in was given tramadol. With that being said she  is still using the Santyl and has not heard from the pain clinic as of yet we did make that referral last week. They did do another x-ray of the ER and there is still no signs of osteomyelitis nonetheless considering how this is going and the pain that she has and I am going to go ahead and proceed with doing a MRI of the finger/hand in order to see if there is any evidence of osteomyelitis or other worsening at this point. Just does not seem to be progressing along the right track but I was hoping for as far as clearing away the eschar and hoping to get this to heal. 12-18-2021 upon evaluation today patient appears to be doing better in regard to her finger ulcer. This is actually showing signs of improvement which is great news there does not appear to be any signs of infection also good news. Fortunately I think she is on the right track here the [place] does seem to have taken out the infection which I believe in turn has helped tremendously with her pain. 12-26-2021 upon evaluation today patient appears to be doing well currently in regard to her wound this is actually softening up a lot as far as the eschar is concerned at this point. Fortunately I do not see any evidence of active infection locally or systemically at this time which is great news. I do think getting some of the eschar off would be good she actually wants it off she states that it looks bad. She is seen with the help of an interpreter today. Electronic Signature(s) Signed: 12/26/2021 1:17:04 PM By: Worthy Keeler PA-C Entered By: Melburn Hake,  Desiray Orchard on 12/26/2021 13:17:04 JAELAH, HAUTH ([id number]) -------------------------------------------------------------------------------- Physical Exam Details Patient Name: ANNELY, SLIVA Date of Service: 12/26/2021 11:30 AM Medical Record Number: [id number] Patient Account Number: [id number] Date of Birth/Sex: July 31, 1955 (66 y.o. F) Treating RN: Huel Coventry Primary Care Provider:  Jenell Milliner Other Clinician: Referring Provider: Jenell Milliner Treating Provider/Extender: Rowan Blase in Treatment: 9 Constitutional Well-nourished and well-hydrated in no acute distress. Respiratory normal breathing without difficulty. Psychiatric this patient is able to make decisions and demonstrates good insight into disease process. Alert and Oriented x 3. pleasant and cooperative. Notes Upon inspection patient's wound bed actually showed signs of good granulation epithelization at this point. Fortunately there does not appear to be any evidence of active infection locally or systemically which is great news and overall I am extremely pleased with where we stand at this point. I did have to numb her up with lidocaine 2% for a total of 5 cc around the base of the finger in order to be able to perform the debridement due to the discomfort she was experiencing when I attempted to debride this. Nonetheless she tolerated that without complication and postdebridement this is doing significantly better. Electronic Signature(s) Signed: 12/26/2021 1:17:44 PM By: Lenda Kelp PA-C Entered By: Lenda Kelp on 12/26/2021 13:17:44 [place], Kambrey ([id number]) -------------------------------------------------------------------------------- Physician Orders Details Patient Name: Olivia Green Date of Service: 12/26/2021 11:30 AM Medical Record Number: [id number] Patient Account Number: [id number] Date of Birth/Sex: 03-Sep-1955 (66 y.o. F) Treating RN: Huel Coventry Primary Care Provider: Jenell Milliner Other Clinician: Referring Provider: Jenell Milliner Treating Provider/Extender: Rowan Blase in Treatment: 9 Verbal / Phone Orders: No Diagnosis Coding ICD-10 Coding Code Description L03.012 Cellulitis of left finger L98.492 Non-pressure chronic ulcer of skin of other sites with fat layer exposed E11.622 Type 2 diabetes mellitus with other skin ulcer I10 Essential  (primary) hypertension Follow-up Appointments o Return Appointment in 2 weeks. Bathing/ Shower/ Hygiene o May shower; gently cleanse wound with antibacterial soap, rinse and pat dry prior to dressing wounds Medications-Please add to medication list. o P.O. Antibiotics - Levaquin for 7 days o Santyl Enzymatic Ointment - apply small amount to wound bed, then cover with a small piece of yellow Xeroform, then cover with a bandaid only change dressing once daily Wound Treatment Wound #1 - Hand - 3rd Digit Wound Laterality: Left, Distal Topical: Santyl Collagenase Ointment, 30 (gm), tube 1 x Per Day/30 Days Discharge Instructions: apply nickel thick to wound bed only Primary Dressing: Xeroform 4x4-HBD (in/in) 1 x Per Day/30 Days Discharge Instructions: Apply Xeroform 4x4-HBD (in/in) as directed Secondary Dressing: Coverlet Latex-Free Fabric Adhesive Dressings 1 x Per Day/30 Days Discharge Instructions: 1.5 x 2 Patient Medications Allergies: lecithin, soy Notifications Medication Indication Start End levofloxacin 12/26/2021 DOSE 1 - oral 500 mg tablet - 1 tablet oral taken 1 time per day for 7 days ibuprofen 12/26/2021 DOSE 1 - oral 800 mg tablet - 1 tablet oral taken 3 times per day as needed for severe pain Electronic Signature(s) Signed: 12/26/2021 12:43:06 PM By: Lenda Kelp PA-C Entered By: Lenda Kelp on 12/26/2021 12:43:06 [place], Daleyza ([id number]) -------------------------------------------------------------------------------- Problem List Details Patient Name: Olivia Green Date of Service: 12/26/2021 11:30 AM Medical Record Number: [id number] Patient Account Number: [id number] Date of Birth/Sex: 1955-05-24 (66 y.o. F) Treating RN: Huel Coventry Primary Care Provider: Jenell Milliner Other Clinician: Referring Provider: Jenell Milliner Treating Provider/Extender: Rowan Blase in Treatment: 9 Active Problems ICD-10 Encounter Code Description Active Date  MDM Diagnosis L03.012 Cellulitis of  left finger 10/24/2021 No Yes L98.492 Non-pressure chronic ulcer of skin of other sites with fat layer exposed 10/24/2021 No Yes E11.622 Type 2 diabetes mellitus with other skin ulcer 10/24/2021 No Yes I10 Essential (primary) hypertension 10/24/2021 No Yes Inactive Problems Resolved Problems Electronic Signature(s) Signed: 12/26/2021 11:43:59 AM By: Lenda Kelp PA-C Entered By: Lenda Kelp on 12/26/2021 11:43:59 [place], Halaina ([id number]) -------------------------------------------------------------------------------- Progress Note Details Patient Name: Olivia Green Date of Service: 12/26/2021 11:30 AM Medical Record Number: [id number] Patient Account Number: [id number] Date of Birth/Sex: 28-Mar-1956 (66 y.o. F) Treating RN: Huel Coventry Primary Care Provider: Jenell Milliner Other Clinician: Referring Provider: Jenell Milliner Treating Provider/Extender: Rowan Blase in Treatment: 9 Subjective Chief Complaint Information obtained from Patient Left 3rd finger ulcer History of Present Illness (HPI) 10-24-2021 upon evaluation today patient appears to be doing somewhat poorly in regard to a wound on her finger. This occurred after she was pulling on a hangnail. Subsequently this is something that seem to come up all of a sudden and has been extremely painful for her. Fortunately there does not appear to be any signs of active systemically though locally that is a different story. There is actually some eschar noted and it is extremely painful to touch she has been taking meloxicam which was helping to some degree. With that being said she has been on Keflex but that did not seem to help at all. The patient was referred to our office for further evaluation and treatment. She does have a history of diabetes mellitus type 2 with hemoglobin A1c of 6.3 on 09-15-2021. 11-07-2021 upon evaluation today patient appears to be doing really a little bit  worse in my opinion compared to last time I saw her. Fortunately I do not see any evidence of infection systemically but unfortunately she is still having what appears to be some infection locally. I do not think that there is any need for sending her to the ER or anything that drastic but I do believe switching her antibiotic and switch her over to [facility] as well should probably be the best way to go at this point. 11-14-2021 upon evaluation today patient appears to be still having quite a bit of an issue with her finger at this point. Fortunately I do not see any signs of infection currently which is good news. With that being said unfortunately I do think she still having a lot of pain. I do not feel like there is any worsening overall and in fact I feel like the redness is improved to some degree compared to what it was. She is still on the Levaquin which I put her on for 14 days starting last week. I also did actually go ahead and give her a prescription as well for Santyl but we have been having a hard time getting this approved. In the end it was denied by insurance. I gave her the co-pay card today so this should be only $80. She has had multiple x-rays of the hand which show no signs of osteomyelitis. She continues to have significant issues with pain and would like a referral to pain management. Unfortunately there is anyone in [place] who does pain management I am wondering if we can possibly see if Dr. Vear Clock in [facility] would be willing to take her on for a short amount of time while we get the wound healed to try to help with the discomfort as this is continued to be an ongoing issue but unfortunately  we do not prescribe any pain medications out of the wound care center. 11-24-2021 upon evaluation today patient appears to be doing well currently in regard to her pain she tells me is a little bit better still stating to be an 8 out of 10. I am not certain that the  redness has really improved much at all despite being placed on the Levaquin. She did go to the hospital the day after I saw her last in was given tramadol. With that being said she is still using the Santyl and has not heard from the pain clinic as of yet we did make that referral last week. They did do another x-ray of the ER and there is still no signs of osteomyelitis nonetheless considering how this is going and the pain that she has and I am going to go ahead and proceed with doing a MRI of the finger/hand in order to see if there is any evidence of osteomyelitis or other worsening at this point. Just does not seem to be progressing along the right track but I was hoping for as far as clearing away the eschar and hoping to get this to heal. 12-18-2021 upon evaluation today patient appears to be doing better in regard to her finger ulcer. This is actually showing signs of improvement which is great news there does not appear to be any signs of infection also good news. Fortunately I think she is on the right track here the Levaquin does seem to have taken out the infection which I believe in turn has helped tremendously with her pain. 12-26-2021 upon evaluation today patient appears to be doing well currently in regard to her wound this is actually softening up a lot as far as the eschar is concerned at this point. Fortunately I do not see any evidence of active infection locally or systemically at this time which is great news. I do think getting some of the eschar off would be good she actually wants it off she states that it looks bad. She is seen with the help of an interpreter today. Objective Constitutional Well-nourished and well-hydrated in no acute distress. Vitals Time Taken: 11:44 AM, Height: 60 in, Weight: 148 lbs, BMI: 28.9, Temperature: 98.4 F, Pulse: 91 bpm, Blood Pressure: 145/79 mmHg. Respiratory normal breathing without difficulty. LINDSI, BAYLISS  ([id number]) Psychiatric this patient is able to make decisions and demonstrates good insight into disease process. Alert and Oriented x 3. pleasant and cooperative. General Notes: Upon inspection patient's wound bed actually showed signs of good granulation epithelization at this point. Fortunately there does not appear to be any evidence of active infection locally or systemically which is great news and overall I am extremely pleased with where we stand at this point. I did have to numb her up with lidocaine 2% for a total of 5 cc around the base of the finger in order to be able to perform the debridement due to the discomfort she was experiencing when I attempted to debride this. Nonetheless she tolerated that without complication and postdebridement this is doing significantly better. Integumentary (Hair, Skin) Wound #1 status is Open. Original cause of wound was Gradually Appeared. The date acquired was: 08/04/2021. The wound has been in treatment 9 weeks. The wound is located on the Left,Distal Hand - 3rd Digit. The wound measures 1.2cm length x 0.7cm width x 0.1cm depth; 0.66cm^2 area and 0.066cm^3 volume. There is Fat Layer (Subcutaneous Tissue) exposed. There is no tunneling or undermining noted. There is  a medium amount of serosanguineous drainage noted. There is no granulation within the wound bed. There is a large (67-100%) amount of necrotic tissue within the wound bed including Adherent Slough. Assessment Active Problems ICD-10 Cellulitis of left finger Non-pressure chronic ulcer of skin of other sites with fat layer exposed Type 2 diabetes mellitus with other skin ulcer Essential (primary) hypertension Procedures Wound #1 Pre-procedure diagnosis of Wound #1 is an Infection - not elsewhere classified located on the Left,Distal Hand - 3rd Digit . There was a Excisional Skin/Subcutaneous Tissue Debridement with a total area of 0.84 sq cm performed by Nelida Meuse., PA-C. With the  following instrument(s): Curette to remove Viable and Non-Viable tissue/material. Material removed includes Subcutaneous Tissue, Slough, Skin: Dermis, and Skin: Epidermis. No specimens were taken. A time out was conducted at 12:15, prior to the start of the procedure. A Moderate amount of bleeding was controlled with Silver Nitrate. The procedure was tolerated well. Post Debridement Measurements: 1.2cm length x 0.8cm width x 0.3cm depth; 0.226cm^3 volume. Character of Wound/Ulcer Post Debridement is stable. Post procedure Diagnosis Wound #1: Same as Pre-Procedure Plan Follow-up Appointments: Return Appointment in 2 weeks. Bathing/ Shower/ Hygiene: May shower; gently cleanse wound with antibacterial soap, rinse and pat dry prior to dressing wounds Medications-Please add to medication list.: P.O. Antibiotics - Levaquin for 7 days Santyl Enzymatic Ointment - apply small amount to wound bed, then cover with a small piece of yellow Xeroform, then cover with a bandaid only change dressing once daily The following medication(s) was prescribed: levofloxacin oral 500 mg tablet 1 1 tablet oral taken 1 time per day for 7 days starting 12/26/2021 ibuprofen oral 800 mg tablet 1 1 tablet oral taken 3 times per day as needed for severe pain starting 12/26/2021 WOUND #1: - Hand - 3rd Digit Wound Laterality: Left, Distal Topical: Santyl Collagenase Ointment, 30 (gm), tube 1 x Per Day/30 Days Discharge Instructions: apply nickel thick to wound bed only Primary Dressing: Xeroform 4x4-HBD (in/in) 1 x Per Day/30 Days Discharge Instructions: Apply Xeroform 4x4-HBD (in/in) as directed Secondary Dressing: Coverlet Latex-Free Fabric Adhesive Dressings 1 x Per Day/30 Days Discharge Instructions: 1.5 x 2 [place], Javayah ([id number]) 1. I am good recommend that we have her continue for this week with the Santyl as well as the Xeroform gauze just to make sure everything stays nice and clean on the surface of the wound.  My hope is that it will be really nice and clean come next week and we can see about switching over to a different type of dressing to try to help this heal faster likely collagen or endoform. 2. I am good recommend as well that we have the patient continue with the Band-Aid to cover which I think she has been doing well with that as well. We will see patient back for reevaluation in 1 week here in the clinic. If anything worsens or changes patient will contact our office for additional recommendations. Electronic Signature(s) Signed: 12/26/2021 1:18:23 PM By: Lenda Kelp PA-C Entered By: Lenda Kelp on 12/26/2021 13:18:22 [place], [place] ([id number]) -------------------------------------------------------------------------------- SuperBill Details Patient Name: Olivia Green Date of Service: 12/26/2021 Medical Record Number: [id number] Patient Account Number: [id number] Date of Birth/Sex: July 17, 1955 (66 y.o. F) Treating RN: Huel Coventry Primary Care Provider: Jenell Milliner Other Clinician: Referring Provider: Jenell Milliner Treating Provider/Extender: Rowan Blase in Treatment: 9 Diagnosis Coding ICD-10 Codes Code Description L03.012 Cellulitis of left finger L98.492 Non-pressure chronic ulcer of skin of other sites with fat  layer exposed E11.622 Type 2 diabetes mellitus with other skin ulcer I10 Essential (primary) hypertension Facility Procedures CPT4 Code: 11914782 Description: 11042 - DEB SUBQ TISSUE 20 SQ CM/< Modifier: Quantity: 1 CPT4 Code: Description: ICD-10 Diagnosis Description L98.492 Non-pressure chronic ulcer of skin of other sites with fat layer expose Modifier: d Quantity: Physician Procedures CPT4 Code: 9562130 Description: 11042 - WC PHYS SUBQ TISS 20 SQ CM Modifier: Quantity: 1 CPT4 Code: Description: ICD-10 Diagnosis Description L98.492 Non-pressure chronic ulcer of skin of other sites with fat layer expose Modifier:  d Quantity: Electronic Signature(s) Signed: 12/26/2021 1:21:52 PM By: Lenda Kelp PA-C Entered By: Lenda Kelp on 12/26/2021 13:21:52

## 2021-12-26 NOTE — Progress Notes (Signed)
AMII, AMBROSI ([id number]) Visit Report for 12/26/2021 Arrival Information Details Patient Name: Olivia Ramos, Olivia Ramos Date of Service: 12/26/2021 11:30 AM Medical Record Number: [id number] Patient Account Number: [id number] Date of Birth/Sex: 08-11-1955 (65 y.o. F) Treating RN: Cornell Barman Primary Care Ariell Gunnels: Danae Orleans Other Clinician: Referring Natalynn Pedone: Danae Orleans Treating Honest Safranek/Extender: Skipper Cliche in Treatment: 9 Visit Information History Since Last Visit Added or deleted any medications: No Patient Arrived: Ambulatory Has Dressing in Place as Prescribed: Yes Arrival Time: 11:44 Pain Present Now: No Accompanied By: son and intrepreter Transfer Assistance: None Patient Identification Verified: Yes Secondary Verification Process Completed: Yes Patient Requires Transmission-Based Precautions: No Patient Has Alerts: No Electronic Signature(s) Unsigned Entered ByGretta Cool, BSN, RN, CWS, Kim on 12/26/2021 11:44:25 Signature(s): Date(s): NECO, VISCUSI ([id number]) -------------------------------------------------------------------------------- Encounter Discharge Information Details Patient Name: Olivia Ramos, Olivia Ramos Date of Service: 12/26/2021 11:30 AM Medical Record Number: [id number] Patient Account Number: [id number] Date of Birth/Sex: [birth year] (65 y.o. F) Treating RN: Cornell Barman Primary Care Tishina Lown: Danae Orleans Other Clinician: Referring Rease Swinson: Danae Orleans Treating Tavien Chestnut/Extender: Skipper Cliche in Treatment: 9 Encounter Discharge Information Items Post Procedure Vitals Discharge Condition: Stable Temperature (F): 98.4 Ambulatory Status: Ambulatory Pulse (bpm): 91 Discharge Destination: Home Respiratory Rate (breaths/min): 16 Transportation: Private Auto Blood Pressure (mmHg): 145/79 Accompanied By: son and intrepreter Schedule Follow-up Appointment: Yes Clinical Summary of Care: Electronic Signature(s) Unsigned Entered By:  Gretta Cool, BSN, RN, CWS, Kim on 12/26/2021 12:41:55 Signature(s): Date(s): DUSTY, LEE ([id number]) -------------------------------------------------------------------------------- Lower Extremity Assessment Details Patient Name: Olivia Ramos, Olivia Ramos Date of Service: 12/26/2021 11:30 AM Medical Record Number: [id number] Patient Account Number: [id number] Date of Birth/Sex: April 17, 1955 (65 y.o. F) Treating RN: Cornell Barman Primary Care Jarae Nemmers: Danae Orleans Other Clinician: Referring Robertson Colclough: Danae Orleans Treating [name] Ra/Extender: Skipper Cliche in Treatment: 9 Electronic Signature(s) Unsigned Entered By: Gretta Cool, BSN, RN, CWS, Kim on 12/26/2021 11:55:58 Signature(s): Date(s): LUPIE, BETSCHART ([id number]) -------------------------------------------------------------------------------- Multi Wound Chart Details Patient Name: Olivia Ramos, Olivia Ramos Date of Service: 12/26/2021 11:30 AM Medical Record Number: [id number] Patient Account Number: [id number] Date of Birth/Sex: 08/15/1955 (65 y.o. F) Treating RN: Cornell Barman Primary Care Obert Espindola: Danae Orleans Other Clinician: Referring Zada Haser: Danae Orleans Treating Shiloh Southern/Extender: Skipper Cliche in Treatment: 9 Vital Signs Height(in): 60 Pulse(bpm): 91 Weight(lbs): 148 Blood Pressure(mmHg): 145/79 Body Mass Index(BMI): 28.9 Temperature(F): 98.4 Respiratory Rate(breaths/min): Photos: [N/A:N/A] Wound Location: Left, Distal Hand - 3rd Digit N/A N/A Wounding Event: Gradually Appeared N/A N/A Primary Etiology: Infection - not elsewhere classified N/A N/A Comorbid History: Asthma, Hypertension, Type II N/A N/A Diabetes Date Acquired: 08/04/2021 N/A N/A Weeks of Treatment: 9 N/A N/A Wound Status: Open N/A N/A Wound Recurrence: No N/A N/A Measurements L x W x D (cm) 1.2x0.7x0.1 N/A N/A Area (cm) : 0.66 N/A N/A Volume (cm) : 0.066 N/A N/A % Reduction in Area: -320.40% N/A N/A % Reduction in Volume: -312.50% N/A  N/A Classification: Full Thickness Without Exposed N/A N/A Support Structures Exudate Amount: Medium N/A N/A Exudate Type: Serosanguineous N/A N/A Exudate Color: red, brown N/A N/A Granulation Amount: None Present (0%) N/A N/A Necrotic Amount: Large (67-100%) N/A N/A Exposed Structures: Fat Layer (Subcutaneous Tissue): N/A N/A Yes Fascia: No Tendon: No Muscle: No Joint: No Bone: No Epithelialization: None N/A N/A Treatment Notes Electronic Signature(s) Unsigned Entered By: Gretta Cool, BSN, RN, CWS, Kim on 12/26/2021 11:57:39 Signature(s): Date(s): ZANYAH, WACH ([id number]) -------------------------------------------------------------------------------- Multi-Disciplinary Care Plan Details Patient Name: Olivia Ramos, Olivia Ramos Date of Service: 12/26/2021 11:30 AM Medical Record Number: [id number] Patient Account Number: [id number] Date of Birth/Sex: [birth year] (65 y.o. F) Treating RN: Gretta Cool,  [place] Primary Care Darroll Bredeson: Danae Orleans Other Clinician: Referring Romolo Sieling: Danae Orleans Treating Naquita Nappier/Extender: Skipper Cliche in Treatment: 9 Active Inactive Necrotic Tissue Nursing Diagnoses: Impaired tissue integrity related to necrotic/devitalized tissue Knowledge deficit related to management of necrotic/devitalized tissue Goals: Necrotic/devitalized tissue will be minimized in the wound bed Date Initiated: 12/26/2021 Target Resolution Date: 12/26/2021 Goal Status: Active Patient/caregiver will verbalize understanding of reason and process for debridement of necrotic tissue Date Initiated: 12/26/2021 Target Resolution Date: 12/26/2021 Goal Status: Active Interventions: Assess patient pain level pre-, during and post procedure and prior to discharge Treatment Activities: Apply topical anesthetic as ordered : 12/26/2021 Excisional debridement : 12/26/2021 Notes: Wound/Skin Impairment Nursing Diagnoses: Knowledge deficit related to ulceration/compromised skin  integrity Goals: Patient/caregiver will verbalize understanding of skin care regimen Date Initiated: 10/24/2021 Target Resolution Date: 11/24/2021 Goal Status: Active Ulcer/skin breakdown will have a volume reduction of 30% by week 4 Date Initiated: 10/24/2021 Target Resolution Date: 11/24/2021 Goal Status: Active Ulcer/skin breakdown will have a volume reduction of 50% by week 8 Date Initiated: 10/24/2021 Target Resolution Date: 12/25/2021 Goal Status: Active Ulcer/skin breakdown will have a volume reduction of 80% by week 12 Date Initiated: 10/24/2021 Target Resolution Date: 01/24/2022 Goal Status: Active Ulcer/skin breakdown will heal within 14 weeks Date Initiated: 10/24/2021 Target Resolution Date: 02/24/2022 Goal Status: Active Interventions: Assess patient/caregiver ability to obtain necessary supplies Assess patient/caregiver ability to perform ulcer/skin care regimen upon admission and as needed Assess ulceration(s) every visit Notes: Olivia Ramos, Olivia Ramos ([id number]) Electronic Signature(s) Unsigned Entered By: Gretta Cool, BSN, RN, CWS, Kim on 12/26/2021 11:56:58 Signature(s): Date(s): Olivia Ramos, Olivia Ramos ([id number]) -------------------------------------------------------------------------------- Pain Assessment Details Patient Name: Olivia Ramos, Olivia Ramos Date of Service: 12/26/2021 11:30 AM Medical Record Number: [id number] Patient Account Number: [id number] Date of Birth/Sex: 17-Dec-1955 (65 y.o. F) Treating RN: Cornell Barman Primary Care Laresha Bacorn: Danae Orleans Other Clinician: Referring Verginia Toohey: Danae Orleans Treating Deshay Blumenfeld/Extender: Skipper Cliche in Treatment: 9 Active Problems Location of Pain Severity and Description of Pain Patient Has Paino Yes Site Locations Pain Location: Pain in Ulcers With Dressing Change: Yes Rate the pain. Current Pain Level: 8 Pain Management and Medication Current Pain Management: Electronic Signature(s) Unsigned Entered By: Gretta Cool,  BSN, RN, CWS, Kim on 12/26/2021 11:45:02 Signature(s): Date(s): CARLYE, KOMAR ([id number]) -------------------------------------------------------------------------------- Patient/Caregiver Education Details Patient Name: PHENIX, Olivia Ramos Date of Service: 12/26/2021 11:30 AM Medical Record Number: [id number] Patient Account Number: [id number] Date of Birth/Gender: 09-03-1955 (65 y.o. F) Treating RN: Cornell Barman Primary Care Physician: Danae Orleans Other Clinician: Referring Physician: Danae Orleans Treating Physician/Extender: Skipper Cliche in Treatment: 9 Education Assessment Education Provided To: Patient Education Topics Provided Infection: Handouts: Other: Levaquin for 7 days. Methods: Explain/Verbal Responses: State content correctly Wound Debridement: Handouts: Wound Debridement Methods: Demonstration, Explain/Verbal Responses: State content correctly Wound/Skin Impairment: Handouts: Other: wound care as prescribed Electronic Signature(s) Unsigned Entered By: Gretta Cool, BSN, RN, CWS, Kim on 12/26/2021 12:41:06 Signature(s): Date(s): CORABELL, GOLDADE ([id number]) -------------------------------------------------------------------------------- Wound Assessment Details Patient Name: TELLY, Olivia Ramos Date of Service: 12/26/2021 11:30 AM Medical Record Number: [id number] Patient Account Number: [id number] Date of Birth/Sex: Apr 05, 1956 (65 y.o. F) Treating RN: Cornell Barman Primary Care Anuel Sitter: Danae Orleans Other Clinician: Referring Westin Knotts: Danae Orleans Treating Dondre Catalfamo/Extender: Skipper Cliche in Treatment: 9 Wound Status Wound Number: 1 Primary Etiology: Infection - not elsewhere classified Wound Location: Left, Distal Hand - 3rd Digit Wound Status: Open Wounding Event: Gradually Appeared Comorbid History: Asthma, Hypertension, Type II Diabetes Date Acquired: 08/04/2021 Weeks Of Treatment: 9 Clustered Wound: No Photos Wound Measurements Length:  (cm) 1.2 Width: (cm)  0.7 Depth: (cm) 0.1 Area: (cm) 0.66 Volume: (cm) 0.066 % Reduction in Area: -320.4% % Reduction in Volume: -312.5% Epithelialization: None Tunneling: No Undermining: No Wound Description Classification: Full Thickness Without Exposed Support Structu Exudate Amount: Medium Exudate Type: Serosanguineous Exudate Color: red, brown res Foul Odor After Cleansing: No Slough/Fibrino Yes Wound Bed Granulation Amount: None Present (0%) Exposed Structure Necrotic Amount: Large (67-100%) Fascia Exposed: No Necrotic Quality: Adherent Slough Fat Layer (Subcutaneous Tissue) Exposed: Yes Tendon Exposed: No Muscle Exposed: No Joint Exposed: No Bone Exposed: No Treatment Notes Wound #1 (Hand - 3rd Digit) Wound Laterality: Left, Distal Cleanser Peri-Wound Care Topical Santyl Collagenase Ointment, 30 (gm), tube Perfetti, Kaitlyn ([id number]) Discharge Instruction: apply nickel thick to wound bed only Primary Dressing Xeroform 4x4-HBD (in/in) Discharge Instruction: Apply Xeroform 4x4-HBD (in/in) as directed Secondary Dressing Coverlet Latex-Free Fabric Adhesive Dressings Discharge Instruction: 1.5 x 2 Secured With Compression Wrap Compression Stockings Add-Ons Electronic Signature(s) Unsigned Entered By: Gretta Cool, BSN, RN, CWS, Kim on 12/26/2021 11:56:21 Signature(s): Date(s): JALA, DUNDON ([id number]) -------------------------------------------------------------------------------- Vitals Details Patient Name: KRYSTEN, VERONICA Date of Service: 12/26/2021 11:30 AM Medical Record Number: [id number] Patient Account Number: [id number] Date of Birth/Sex: Dec 19, 1955 (65 y.o. F) Treating RN: Cornell Barman Primary Care Cassondra Stachowski: Danae Orleans Other Clinician: Referring [place] Spees: Danae Orleans Treating Nyree Yonker/Extender: Skipper Cliche in Treatment: 9 Vital Signs Time Taken: 11:44 Temperature (F): 98.4 Height (in): 60 Pulse (bpm): 91 Weight (lbs):  148 Blood Pressure (mmHg): 145/79 Body Mass Index (BMI): 28.9 Reference Range: 80 - 120 mg / dl Electronic Signature(s) Unsigned Entered By: Gretta Cool, BSN, RN, CWS, Kim on 12/26/2021 11:44:45 Signature(s): Date(s):

## 2022-01-08 ENCOUNTER — Ambulatory Visit: Payer: BC Managed Care – PPO | Admitting: Physician Assistant

## 2022-01-13 ENCOUNTER — Encounter: Payer: BC Managed Care – PPO | Attending: Physician Assistant | Admitting: Physician Assistant

## 2022-01-13 DIAGNOSIS — L03012 Cellulitis of left finger: Secondary | ICD-10-CM | POA: Insufficient documentation

## 2022-01-13 DIAGNOSIS — L98492 Non-pressure chronic ulcer of skin of other sites with fat layer exposed: Secondary | ICD-10-CM | POA: Diagnosis not present

## 2022-01-13 DIAGNOSIS — E11622 Type 2 diabetes mellitus with other skin ulcer: Secondary | ICD-10-CM | POA: Diagnosis present

## 2022-01-13 DIAGNOSIS — I10 Essential (primary) hypertension: Secondary | ICD-10-CM | POA: Insufficient documentation

## 2022-01-13 NOTE — Progress Notes (Signed)
Olivia Ramos, Olivia Ramos ([id number]) Visit Report for 01/13/2022 Chief Complaint Document Details Patient Name: Olivia Ramos, Olivia Ramos Date of Service: 01/13/2022 4:00 PM Medical Record Number: [id number] Patient Account Number: [id number] Date of Birth/Sex: Apr 06, 1956 (65 y.o. F) Treating RN: Zenaida Deed Primary Care Provider: Jenell Milliner Other Clinician: Referring Provider: Jenell Milliner Treating Provider/Extender: Rowan Blase in Treatment: 11 Information Obtained from: Patient Chief Complaint Left 3rd finger ulcer Electronic Signature(s) Signed: 01/13/2022 4:14:38 PM By: Lenda Kelp PA-C Entered By: Lenda Kelp on 01/13/2022 16:14:38 [place], [place] ([id number]) -------------------------------------------------------------------------------- Debridement Details Patient Name: Olivia Ramos Date of Service: 01/13/2022 4:00 PM Medical Record Number: [id number] Patient Account Number: [id number] Date of Birth/Sex: October 11, 1955 (65 y.o. F) Treating RN: Zenaida Deed Primary Care Provider: Jenell Milliner Other Clinician: Referring Provider: Jenell Milliner Treating Provider/Extender: Rowan Blase in Treatment: 11 Debridement Performed for Wound #1 Left,Distal Hand - 3rd Digit Assessment: Performed By: Physician Nelida Meuse., PA-C Debridement Type: Debridement Level of Consciousness (Pre- Awake and Alert procedure): Pre-procedure Verification/Time Out Yes - 16:25 Taken: Start Time: 16:27 Pain Control: Lidocaine 4% Topical Solution Total Area Debrided (L x W): 0.9 (cm) x 0.5 (cm) = 0.45 (cm) Tissue and other material Viable, Non-Viable, Slough, Subcutaneous, Slough debrided: Level: Skin/Subcutaneous Tissue Debridement Description: Excisional Instrument: Curette Bleeding: Minimum Hemostasis Achieved: Pressure Procedural Pain: 3 Post Procedural Pain: 1 Response to Treatment: Procedure was tolerated well Level of Consciousness (Post- Awake and  Alert procedure): Post Debridement Measurements of Total Wound Length: (cm) 0.9 Width: (cm) 0.5 Depth: (cm) 0.1 Volume: (cm) 0.035 Character of Wound/Ulcer Post Debridement: Improved Post Procedure Diagnosis Same as Pre-procedure Electronic Signature(s) Signed: 01/13/2022 5:00:21 PM By: Zenaida Deed RN, BSN Signed: 01/13/2022 6:10:21 PM By: Lenda Kelp PA-C Entered By: Zenaida Deed on 01/13/2022 16:30:21 [place], Shalicia ([id number]) -------------------------------------------------------------------------------- HPI Details Patient Name: Olivia Ramos Date of Service: 01/13/2022 4:00 PM Medical Record Number: [id number] Patient Account Number: [id number] Date of Birth/Sex: 07/13/1955 (65 y.o. F) Treating RN: Zenaida Deed Primary Care Provider: Jenell Milliner Other Clinician: Referring Provider: Jenell Milliner Treating Provider/Extender: Rowan Blase in Treatment: 11 History of Present Illness HPI Description: 10-24-2021 upon evaluation today patient appears to be doing somewhat poorly in regard to a wound on her finger. This occurred after she was pulling on a hangnail. Subsequently this is something that seem to come up all of a sudden and has been extremely painful for her. Fortunately there does not appear to be any signs of active systemically though locally that is a different story. There is actually some eschar noted and it is extremely painful to touch she has been taking meloxicam which was helping to some degree. With that being said she has been on Keflex but that did not seem to help at all. The patient was referred to our office for further evaluation and treatment. She does have a history of diabetes mellitus type 2 with hemoglobin A1c of 6.3 on 09-15-2021. 11-07-2021 upon evaluation today patient appears to be doing really a little bit worse in my opinion compared to last time I saw her. Fortunately I do not see any evidence of infection  systemically but unfortunately she is still having what appears to be some infection locally. I do not think that there is any need for sending her to the ER or anything that drastic but I do believe switching her antibiotic and switch her over to [facility] as well should probably be the best way to go at this point. 11-14-2021 upon evaluation today patient  appears to be still having quite a bit of an issue with her finger at this point. Fortunately I do not see any signs of infection currently which is good news. With that being said unfortunately I do think she still having a lot of pain. I do not feel like there is any worsening overall and in fact I feel like the redness is improved to some degree compared to what it was. She is still on the Levaquin which I put her on for 14 days starting last week. I also did actually go ahead and give her a prescription as well for Santyl but we have been having a hard time getting this approved. In the end it was denied by insurance. I gave her the co-pay card today so this should be only $80. She has had multiple x-rays of the hand which show no signs of osteomyelitis. She continues to have significant issues with pain and would like a referral to pain management. Unfortunately there is anyone in [place] who does pain management I am wondering if we can possibly see if Dr. Vear Clock in [facility] would be willing to take her on for a short amount of time while we get the wound healed to try to help with the discomfort as this is continued to be an ongoing issue but unfortunately we do not prescribe any pain medications out of the wound care center. 11-24-2021 upon evaluation today patient appears to be doing well currently in regard to her pain she tells me is a little bit better still stating to be an 8 out of 10. I am not certain that the redness has really improved much at all despite being placed on the Levaquin. She did go to the hospital the  day after I saw her last in was given tramadol. With that being said she is still using the Santyl and has not heard from the pain clinic as of yet we did make that referral last week. They did do another x-ray of the ER and there is still no signs of osteomyelitis nonetheless considering how this is going and the pain that she has and I am going to go ahead and proceed with doing a MRI of the finger/hand in order to see if there is any evidence of osteomyelitis or other worsening at this point. Just does not seem to be progressing along the right track but I was hoping for as far as clearing away the eschar and hoping to get this to heal. 12-18-2021 upon evaluation today patient appears to be doing better in regard to her finger ulcer. This is actually showing signs of improvement which is great news there does not appear to be any signs of infection also good news. Fortunately I think she is on the right track here the Levaquin does seem to have taken out the infection which I believe in turn has helped tremendously with her pain. 12-26-2021 upon evaluation today patient appears to be doing well currently in regard to her wound this is actually softening up a lot as far as the eschar is concerned at this point. Fortunately I do not see any evidence of active infection locally or systemically at this time which is great news. I do think getting some of the eschar off would be good she actually wants it off she states that it looks bad. She is seen with the help of an interpreter today. 01-13-2022 upon evaluation patient's wound bed actually showed signs of significant  improvement is much smaller looks much better. I do feel like we need to try to clean away some of the necrotic debris today and she is in agreement with that plan. Electronic Signature(s) Signed: 01/13/2022 5:41:55 PM By: Lenda KelpStone III, Kynzlie Hilleary PA-C Entered By: Lenda KelpStone III, Javon Hupfer on 01/13/2022 17:41:55 [place]HERNANDEZ, Olivia Ramos  ([id number]) -------------------------------------------------------------------------------- Physical Exam Details Patient Name: Olivia GreenHERNANDEZ, Olivia Ramos Date of Service: 01/13/2022 4:00 PM Medical Record Number: [id number] Patient Account Number: [id number] Date of Birth/Sex: 1955/06/30 (65 y.o. F) Treating RN: Zenaida DeedBoehlein, Linda Primary Care Provider: Jenell MillinerLuyando, Yvonne Other Clinician: Referring Provider: Jenell MillinerLuyando, Yvonne Treating Provider/Extender: Rowan BlaseStone, Jamiya Nims Weeks in Treatment: 11 Constitutional Well-nourished and well-hydrated in no acute distress. Respiratory normal breathing without difficulty. Psychiatric this patient is able to make decisions and demonstrates good insight into disease process. Alert and Oriented x 3. pleasant and cooperative. Notes Upon inspection patient's wound bed actually showed signs of good granulation and epithelization at this point. Fortunately I do not see any evidence of infection locally or systemically at this time. Electronic Signature(s) Signed: 01/13/2022 5:42:10 PM By: Lenda KelpStone III, Aslee Such PA-C Entered By: Lenda KelpStone III, Jasia Hiltunen on 01/13/2022 17:42:10 [place], Olivia Ramos ([id number]) -------------------------------------------------------------------------------- Physician Orders Details Patient Name: Olivia GreenHERNANDEZ, Olivia Ramos Date of Service: 01/13/2022 4:00 PM Medical Record Number: [id number] Patient Account Number: [id number] Date of Birth/Sex: 1955/06/30 (65 y.o. F) Treating RN: Zenaida DeedBoehlein, Linda Primary Care Provider: Jenell MillinerLuyando, Yvonne Other Clinician: Referring Provider: Jenell MillinerLuyando, Yvonne Treating Provider/Extender: Rowan BlaseStone, Lua Feng Weeks in Treatment: 4611 Verbal / Phone Orders: No Diagnosis Coding ICD-10 Coding Code Description L03.012 Cellulitis of left finger L98.492 Non-pressure chronic ulcer of skin of other sites with fat layer exposed E11.622 Type 2 diabetes mellitus with other skin ulcer I10 Essential (primary) hypertension Follow-up Appointments o Return  Appointment in 2 weeks. Bathing/ Shower/ Hygiene o May shower; gently cleanse wound with antibacterial soap, rinse and pat dry prior to dressing wounds Medications-Please add to medication list. o Santyl Enzymatic Ointment - apply small amount to wound bed, then cover with a small piece of yellow Xeroform, then cover with a bandaid only change dressing once daily Wound Treatment Wound #1 - Hand - 3rd Digit Wound Laterality: Left, Distal Topical: Santyl Collagenase Ointment, 30 (gm), tube 1 x Per Day/30 Days Discharge Instructions: apply nickel thick to wound bed only Primary Dressing: Xeroform 4x4-HBD (in/in) 1 x Per Day/30 Days Discharge Instructions: Apply Xeroform 4x4-HBD (in/in) as directed Secondary Dressing: Coverlet Latex-Free Fabric Adhesive Dressings 1 x Per Day/30 Days Discharge Instructions: 1.5 x 2 Patient Medications Allergies: lecithin, soy Notifications Medication Indication Start End lidocaine prior to debridement 01/13/2022 DOSE topical 4 % cream - cream topical Electronic Signature(s) Signed: 01/13/2022 5:00:21 PM By: Zenaida DeedBoehlein, Linda RN, BSN Signed: 01/13/2022 6:10:21 PM By: Lenda KelpStone III, Brylan Seubert PA-C Entered By: Zenaida DeedBoehlein, Linda on 01/13/2022 16:33:56 [place], Olivia Ramos ([id number]) -------------------------------------------------------------------------------- Problem List Details Patient Name: Olivia GreenHERNANDEZ, Glennice Date of Service: 01/13/2022 4:00 PM Medical Record Number: [id number] Patient Account Number: [id number] Date of Birth/Sex: 1955/06/30 (65 y.o. F) Treating RN: Zenaida DeedBoehlein, Linda Primary Care Provider: Jenell MillinerLuyando, Yvonne Other Clinician: Referring Provider: Jenell MillinerLuyando, Yvonne Treating Provider/Extender: Rowan BlaseStone, Darrian Goodwill Weeks in Treatment: 11 Active Problems ICD-10 Encounter Code Description Active Date MDM Diagnosis L03.012 Cellulitis of left finger 10/24/2021 No Yes L98.492 Non-pressure chronic ulcer of skin of other sites with fat layer exposed 10/24/2021 No  Yes E11.622 Type 2 diabetes mellitus with other skin ulcer 10/24/2021 No Yes I10 Essential (primary) hypertension 10/24/2021 No Yes Inactive Problems Resolved Problems Electronic Signature(s) Signed: 01/13/2022 4:14:34 PM By: Lenda KelpStone III, Shanielle Correll PA-C Entered By:  Lenda Kelp on 01/13/2022 16:14:34 Olivia Ramos, Olivia Ramos ([id number]) -------------------------------------------------------------------------------- Progress Note Details Patient Name: Olivia Ramos, Olivia Ramos Date of Service: 01/13/2022 4:00 PM Medical Record Number: [id number] Patient Account Number: [id number] Date of Birth/Sex: 1955-05-13 (65 y.o. F) Treating RN: Zenaida Deed Primary Care Provider: Jenell Milliner Other Clinician: Referring Provider: Jenell Milliner Treating Provider/Extender: Rowan Blase in Treatment: 11 Subjective Chief Complaint Information obtained from Patient Left 3rd finger ulcer History of Present Illness (HPI) 10-24-2021 upon evaluation today patient appears to be doing somewhat poorly in regard to a wound on her finger. This occurred after she was pulling on a hangnail. Subsequently this is something that seem to come up all of a sudden and has been extremely painful for her. Fortunately there does not appear to be any signs of active systemically though locally that is a different story. There is actually some eschar noted and it is extremely painful to touch she has been taking meloxicam which was helping to some degree. With that being said she has been on Keflex but that did not seem to help at all. The patient was referred to our office for further evaluation and treatment. She does have a history of diabetes mellitus type 2 with hemoglobin A1c of 6.3 on 09-15-2021. 11-07-2021 upon evaluation today patient appears to be doing really a little bit worse in my opinion compared to last time I saw her. Fortunately I do not see any evidence of infection systemically but unfortunately she is still having  what appears to be some infection locally. I do not think that there is any need for sending her to the ER or anything that drastic but I do believe switching her antibiotic and switch her over to [facility] as well should probably be the best way to go at this point. 11-14-2021 upon evaluation today patient appears to be still having quite a bit of an issue with her finger at this point. Fortunately I do not see any signs of infection currently which is good news. With that being said unfortunately I do think she still having a lot of pain. I do not feel like there is any worsening overall and in fact I feel like the redness is improved to some degree compared to what it was. She is still on the Levaquin which I put her on for 14 days starting last week. I also did actually go ahead and give her a prescription as well for Santyl but we have been having a hard time getting this approved. In the end it was denied by insurance. I gave her the co-pay card today so this should be only $80. She has had multiple x-rays of the hand which show no signs of osteomyelitis. She continues to have significant issues with pain and would like a referral to pain management. Unfortunately there is anyone in [place] who does pain management I am wondering if we can possibly see if Dr. Vear Clock in [facility] would be willing to take her on for a short amount of time while we get the wound healed to try to help with the discomfort as this is continued to be an ongoing issue but unfortunately we do not prescribe any pain medications out of the wound care center. 11-24-2021 upon evaluation today patient appears to be doing well currently in regard to her pain she tells me is a little bit better still stating to be an 8 out of 10. I am not certain that the redness has really improved much  at all despite being placed on the Levaquin. She did go to the hospital the day after I saw her last in was given tramadol.  With that being said she is still using the Santyl and has not heard from the pain clinic as of yet we did make that referral last week. They did do another x-ray of the ER and there is still no signs of osteomyelitis nonetheless considering how this is going and the pain that she has and I am going to go ahead and proceed with doing a MRI of the finger/hand in order to see if there is any evidence of osteomyelitis or other worsening at this point. Just does not seem to be progressing along the right track but I was hoping for as far as clearing away the eschar and hoping to get this to heal. 12-18-2021 upon evaluation today patient appears to be doing better in regard to her finger ulcer. This is actually showing signs of improvement which is great news there does not appear to be any signs of infection also good news. Fortunately I think she is on the right track here the Levaquin does seem to have taken out the infection which I believe in turn has helped tremendously with her pain. 12-26-2021 upon evaluation today patient appears to be doing well currently in regard to her wound this is actually softening up a lot as far as the eschar is concerned at this point. Fortunately I do not see any evidence of active infection locally or systemically at this time which is great news. I do think getting some of the eschar off would be good she actually wants it off she states that it looks bad. She is seen with the help of an interpreter today. 01-13-2022 upon evaluation patient's wound bed actually showed signs of significant improvement is much smaller looks much better. I do feel like we need to try to clean away some of the necrotic debris today and she is in agreement with that plan. Objective Constitutional Well-nourished and well-hydrated in no acute distress. Vitals Time Taken: 4:10 PM, Height: 60 in, Weight: 148 lbs, BMI: 28.9, Temperature: 98.3 F, Pulse: 92 bpm, Respiratory Rate: 18  breaths/min, Blood Pressure: 164/92 mmHg. Brusca, Erendida ([id number]) Respiratory normal breathing without difficulty. Psychiatric this patient is able to make decisions and demonstrates good insight into disease process. Alert and Oriented x 3. pleasant and cooperative. General Notes: Upon inspection patient's wound bed actually showed signs of good granulation and epithelization at this point. Fortunately I do not see any evidence of infection locally or systemically at this time. Integumentary (Hair, Skin) Wound #1 status is Open. Original cause of wound was Gradually Appeared. The date acquired was: 08/04/2021. The wound has been in treatment 11 weeks. The wound is located on the Left,Distal Hand - 3rd Digit. The wound measures 0.9cm length x 0.5cm width x 0.1cm depth; 0.353cm^2 area and 0.035cm^3 volume. There is Fat Layer (Subcutaneous Tissue) exposed. There is no tunneling or undermining noted. There is a medium amount of serosanguineous drainage noted. The wound margin is flat and intact. There is medium (34-66%) red granulation within the wound bed. There is a medium (34-66%) amount of necrotic tissue within the wound bed including Adherent Slough. Assessment Active Problems ICD-10 Cellulitis of left finger Non-pressure chronic ulcer of skin of other sites with fat layer exposed Type 2 diabetes mellitus with other skin ulcer Essential (primary) hypertension Procedures Wound #1 Pre-procedure diagnosis of Wound #1 is an Infection -  not elsewhere classified located on the Left,Distal Hand - 3rd Digit . There was a Excisional Skin/Subcutaneous Tissue Debridement with a total area of 0.45 sq cm performed by Nelida Meuse., PA-C. With the following instrument(s): Curette to remove Viable and Non-Viable tissue/material. Material removed includes Subcutaneous Tissue and Slough and after achieving pain control using Lidocaine 4% Topical Solution. No specimens were taken. A time out was  conducted at 16:25, prior to the start of the procedure. A Minimum amount of bleeding was controlled with Pressure. The procedure was tolerated well with a pain level of 3 throughout and a pain level of 1 following the procedure. Post Debridement Measurements: 0.9cm length x 0.5cm width x 0.1cm depth; 0.035cm^3 volume. Character of Wound/Ulcer Post Debridement is improved. Post procedure Diagnosis Wound #1: Same as Pre-Procedure Plan Follow-up Appointments: Return Appointment in 2 weeks. Bathing/ Shower/ Hygiene: May shower; gently cleanse wound with antibacterial soap, rinse and pat dry prior to dressing wounds Medications-Please add to medication list.: Santyl Enzymatic Ointment - apply small amount to wound bed, then cover with a small piece of yellow Xeroform, then cover with a bandaid only change dressing once daily The following medication(s) was prescribed: lidocaine topical 4 % cream cream topical for prior to debridement was prescribed at facility WOUND #1: - Hand - 3rd Digit Wound Laterality: Left, Distal Topical: Santyl Collagenase Ointment, 30 (gm), tube 1 x Per Day/30 Days Discharge Instructions: apply nickel thick to wound bed only Primary Dressing: Xeroform 4x4-HBD (in/in) 1 x Per Day/30 Days Discharge Instructions: Apply Xeroform 4x4-HBD (in/in) as directed Secondary Dressing: Coverlet Latex-Free Fabric Adhesive Dressings 1 x Per Day/30 Days Discharge Instructions: 1.5 x 2 Williams, Bannie ([id number]) 1. I am going to recommend that we have the patient continue to monitor for any signs of worsening or infection. This includes the use of the Santyl which I do believe is doing quite well currently. 2. Regular continue with the Xeroform a coverlet to go over top of this. 3. I am also going to suggest the patient should continue to monitor for any signs of worsening and if anything changes she definitely needs to let me know soon as possible. I am very pleased though with how  things appear today. We will see patient back for reevaluation in 1 week here in the clinic. If anything worsens or changes patient will contact our office for additional recommendations. Electronic Signature(s) Signed: 01/13/2022 5:42:47 PM By: Lenda Kelp PA-C Entered By: Lenda Kelp on 01/13/2022 17:42:47 [place], [place] ([id number]) -------------------------------------------------------------------------------- SuperBill Details Patient Name: Olivia Ramos Date of Service: 01/13/2022 Medical Record Number: [id number] Patient Account Number: [id number] Date of Birth/Sex: 09/10/1955 (65 y.o. F) Treating RN: Zenaida Deed Primary Care Provider: Jenell Milliner Other Clinician: Referring Provider: Jenell Milliner Treating Provider/Extender: Rowan Blase in Treatment: 11 Diagnosis Coding ICD-10 Codes Code Description L03.012 Cellulitis of left finger L98.492 Non-pressure chronic ulcer of skin of other sites with fat layer exposed E11.622 Type 2 diabetes mellitus with other skin ulcer I10 Essential (primary) hypertension Facility Procedures CPT4 Code: 05397673 Description: 11042 - DEB SUBQ TISSUE 20 SQ CM/< Modifier: Quantity: 1 CPT4 Code: Description: ICD-10 Diagnosis Description L98.492 Non-pressure chronic ulcer of skin of other sites with fat layer expose Modifier: d Quantity: Physician Procedures CPT4 Code: 4193790 Description: 11042 - WC PHYS SUBQ TISS 20 SQ CM Modifier: Quantity: 1 CPT4 Code: Description: ICD-10 Diagnosis Description L98.492 Non-pressure chronic ulcer of skin of other sites with fat layer expose Modifier: d Quantity: Electronic Signature(s) Signed:  01/13/2022 5:42:55 PM By: Worthy Keeler PA-C Previous Signature: 01/13/2022 5:00:21 PM Version By: Baruch Gouty RN, BSN Entered By: Worthy Keeler on 01/13/2022 17:42:54

## 2022-01-14 NOTE — Progress Notes (Signed)
Olivia Ramos, Olivia Ramos ([id number]) Visit Report for 01/13/2022 Arrival Information Details Patient Name: Olivia Ramos, Olivia Ramos Date of Service: 01/13/2022 4:00 PM Medical Record Number: [id number] Patient Account Number: [id number] Date of Birth/Sex: 19-Aug-1955 (65 y.o. F) Treating RN: Baruch Gouty Primary Care Emalynn Clewis: Danae Orleans Other Clinician: Referring Geraldyn Shain: Danae Orleans Treating Zhuri Krass/Extender: Skipper Cliche in Treatment: 11 Visit Information History Since Last Visit Added or deleted any medications: No Patient Arrived: Ambulatory Any new allergies or adverse reactions: No Arrival Time: 16:08 Had a fall or experienced change in No Accompanied By: son and interpreter activities of daily living that may affect Transfer Assistance: None risk of falls: Patient Identification Verified: Yes Signs or symptoms of abuse/neglect since last visito No Secondary Verification Process Completed: Yes Hospitalized since last visit: No Patient Requires Transmission-Based Precautions: No Implantable device outside of the clinic excluding No Patient Has Alerts: No cellular tissue based products placed in the center since last visit: Has Dressing in Place as Prescribed: Yes Pain Present Now: No Electronic Signature(s) Signed: 01/13/2022 5:00:21 PM By: Baruch Gouty RN, BSN Entered By: Baruch Gouty on 01/13/2022 16:10:16 Olivia Ramos ([id number]) -------------------------------------------------------------------------------- Encounter Discharge Information Details Patient Name: Olivia Ramos Date of Service: 01/13/2022 4:00 PM Medical Record Number: [id number] Patient Account Number: [id number] Date of Birth/Sex: 05-Aug-1955 (65 y.o. F) Treating RN: Baruch Gouty Primary Care Sheikh Leverich: Danae Orleans Other Clinician: Referring Elena Davia: Danae Orleans Treating Telly Broberg/Extender: Skipper Cliche in Treatment: 11 Encounter Discharge Information Items Post  Procedure Vitals Discharge Condition: Stable Temperature (F): 98.3 Ambulatory Status: Ambulatory Pulse (bpm): 92 Discharge Destination: Home Respiratory Rate (breaths/min): 18 Transportation: Private Auto Blood Pressure (mmHg): 164/92 Accompanied By: son, interpreter Schedule Follow-up Appointment: Yes Clinical Summary of Care: Patient Declined Electronic Signature(s) Signed: 01/13/2022 5:00:21 PM By: Baruch Gouty RN, BSN Entered By: Baruch Gouty on 01/13/2022 16:35:05 [place], Olivia Ramos ([id number]) -------------------------------------------------------------------------------- Multi Wound Chart Details Patient Name: Olivia Ramos Date of Service: 01/13/2022 4:00 PM Medical Record Number: [id number] Patient Account Number: [id number] Date of Birth/Sex: 1955/08/15 (65 y.o. F) Treating RN: Baruch Gouty Primary Care Kaylina Cahue: Danae Orleans Other Clinician: Referring Clara Herbison: Danae Orleans Treating Kalandra Masters/Extender: Skipper Cliche in Treatment: 11 Vital Signs Height(in): 60 Pulse(bpm): 92 Weight(lbs): 148 Blood Pressure(mmHg): 164/92 Body Mass Index(BMI): 28.9 Temperature(F): 98.3 Respiratory Rate(breaths/min): 18 Photos: [N/A:N/A] Wound Location: Left, Distal Hand - 3rd Digit N/A N/A Wounding Event: Gradually Appeared N/A N/A Primary Etiology: Infection - not elsewhere classified N/A N/A Comorbid History: Asthma, Hypertension, Type II N/A N/A Diabetes Date Acquired: 08/04/2021 N/A N/A Weeks of Treatment: 11 N/A N/A Wound Status: Open N/A N/A Wound Recurrence: No N/A N/A Measurements L x W x D (cm) 0.9x0.5x0.1 N/A N/A Area (cm) : 0.353 N/A N/A Volume (cm) : 0.035 N/A N/A % Reduction in Area: -124.80% N/A N/A % Reduction in Volume: -118.70% N/A N/A Classification: Full Thickness Without Exposed N/A N/A Support Structures Exudate Amount: Medium N/A N/A Exudate Type: Serosanguineous N/A N/A Exudate Color: red, brown N/A N/A Wound Margin: Flat and  Intact N/A N/A Granulation Amount: Medium (34-66%) N/A N/A Granulation Quality: Red N/A N/A Necrotic Amount: Medium (34-66%) N/A N/A Exposed Structures: Fat Layer (Subcutaneous Tissue): N/A N/A Yes Fascia: No Tendon: No Muscle: No Joint: No Bone: No Epithelialization: Medium (34-66%) N/A N/A Treatment Notes Electronic Signature(s) Signed: 01/13/2022 5:00:21 PM By: Baruch Gouty RN, BSN Entered By: Baruch Gouty on 01/13/2022 16:18:29 Olivia Ramos ([id number]) -------------------------------------------------------------------------------- Multi-Disciplinary Care Plan Details Patient Name: Olivia Ramos Date of Service: 01/13/2022 4:00 PM Medical Record Number: [id number] Patient Account Number: [id number]  Date of Birth/Sex: Nov 13, 1955 (66 y.o. F) Treating RN: Baruch Gouty Primary Care Elwanda Moger: Danae Orleans Other Clinician: Referring Kaidence Sant: Danae Orleans Treating Eleina Jergens/Extender: Skipper Cliche in Treatment: 11 oo Multidisciplinary Care Plan reviewed with physician Active Inactive Necrotic Tissue Nursing Diagnoses: Impaired tissue integrity related to necrotic/devitalized tissue Knowledge deficit related to management of necrotic/devitalized tissue Goals: Necrotic/devitalized tissue will be minimized in the wound bed Date Initiated: 12/26/2021 Target Resolution Date: 01/23/2022 Goal Status: Active Patient/caregiver will verbalize understanding of reason and process for debridement of necrotic tissue Date Initiated: 12/26/2021 Target Resolution Date: 01/23/2022 Goal Status: Active Interventions: Assess patient pain level pre-, during and post procedure and prior to discharge Treatment Activities: Apply topical anesthetic as ordered : 12/26/2021 Excisional debridement : 12/26/2021 Notes: Wound/Skin Impairment Nursing Diagnoses: Knowledge deficit related to ulceration/compromised skin integrity Goals: Patient/caregiver will verbalize  understanding of skin care regimen Date Initiated: 10/24/2021 Target Resolution Date: 01/23/2022 Goal Status: Active Ulcer/skin breakdown will have a volume reduction of 30% by week 4 Date Initiated: 10/24/2021 Date Inactivated: 01/13/2022 Target Resolution Date: 11/24/2021 Goal Status: Met Ulcer/skin breakdown will have a volume reduction of 50% by week 8 Date Initiated: 10/24/2021 Date Inactivated: 01/13/2022 Target Resolution Date: 12/25/2021 Goal Status: Met Ulcer/skin breakdown will have a volume reduction of 80% by week 12 Date Initiated: 10/24/2021 Target Resolution Date: 01/24/2022 Goal Status: Active Ulcer/skin breakdown will heal within 14 weeks Date Initiated: 10/24/2021 Target Resolution Date: 02/24/2022 Goal Status: Active Interventions: Assess patient/caregiver ability to obtain necessary supplies Assess patient/caregiver ability to perform ulcer/skin care regimen upon admission and as needed Assess ulceration(s) every visit Notes: Olivia Ramos, Olivia Ramos ([id number]) Electronic Signature(s) Signed: 01/13/2022 5:00:21 PM By: Baruch Gouty RN, BSN Entered By: Baruch Gouty on 01/13/2022 [place], Olivia Ramos ([id number]) -------------------------------------------------------------------------------- Pain Assessment Details Patient Name: Olivia Ramos Date of Service: 01/13/2022 4:00 PM Medical Record Number: [id number] Patient Account Number: [id number] Date of Birth/Sex: [birth year] (65 y.o. F) Treating RN: Baruch Gouty Primary Care Wealthy Danielski: Danae Orleans Other Clinician: Referring Luisfelipe Engel[place]: Danae Orleans Treating Zakery Normington/Extender: Skipper Cliche in Treatment: 11 Active Problems Location of Pain Severity and Description of Pain Patient Has Paino No Site Locations With Dressing Change: Yes Duration of the Pain. Constant / Intermittento Intermittent Rate the pain. Current Pain Level: 0 Worst Pain Level: 2 Character of Pain Describe the Pain:  Aching, Tender Pain Management and Medication Current Pain Management: Electronic Signature(s) Signed: 01/13/2022 5:00:21 PM By: Baruch Gouty RN, BSN Entered By: Baruch Gouty on 01/13/2022 16:11:38 Olivia Ramos ([id number]) -------------------------------------------------------------------------------- Patient/Caregiver Education Details Patient Name: Olivia Ramos Date of Service: 01/13/2022 4:00 PM Medical Record Number: [id number] Patient Account Number: [id number] Date of Birth/Gender: 09-23-1955 (65 y.o. F) Treating RN: Baruch Gouty Primary Care Physician: Danae Orleans Other Clinician: Referring Physician: Danae Orleans Treating Physician/Extender: Skipper Cliche in Treatment: 11 Education Assessment Education Provided To: Patient Education Topics Provided Wound/Skin Impairment: Methods: Explain/Verbal Responses: Reinforcements needed, State content correctly [employer]) Signed: 01/13/2022 5:00:21 PM By: Baruch Gouty RN, BSN Entered By: Baruch Gouty on 01/13/2022 16:18:42 Olivia Ramos, Olivia Ramos ([id number]) -------------------------------------------------------------------------------- Wound Assessment Details Patient Name: Olivia Ramos Date of Service: 01/13/2022 4:00 PM Medical Record Number: [id number] Patient Account Number: [id number] Date of Birth/Sex: [birth year] (65 y.o. F) Treating RN: Baruch Gouty Primary Care Brayan Votaw: Danae Orleans Other Clinician: Referring Shamar Engelmann: Danae Orleans Treating Bedie Dominey/Extender: Skipper Cliche in Treatment: 11 Wound Status Wound Number: 1 Primary Etiology: Infection - not elsewhere classified Wound Location: Left, Distal Hand - 3rd Digit Wound Status: Open Wounding Event: Gradually Appeared Comorbid History:  Asthma, Hypertension, Type II Diabetes Date Acquired: 08/04/2021 Weeks Of Treatment: 11 Clustered Wound: No Photos Wound Measurements Length: (cm) 0.9 Width: (cm)  0.5 Depth: (cm) 0.1 Area: (cm) 0.353 Volume: (cm) 0.035 % Reduction in Area: -124.8% % Reduction in Volume: -118.7% Epithelialization: Medium (34-66%) Tunneling: No Undermining: No Wound Description Classification: Full Thickness Without Exposed Support Structu Wound Margin: Flat and Intact Exudate Amount: Medium Exudate Type: Serosanguineous Exudate Color: red, brown res Foul Odor After Cleansing: No Slough/Fibrino Yes Wound Bed Granulation Amount: Medium (34-66%) Exposed Structure Granulation Quality: Red Fascia Exposed: No Necrotic Amount: Medium (34-66%) Fat Layer (Subcutaneous Tissue) Exposed: Yes Necrotic Quality: Adherent Slough Tendon Exposed: No Muscle Exposed: No Joint Exposed: No Bone Exposed: No Treatment Notes Wound #1 (Hand - 3rd Digit) Wound Laterality: Left, Distal Cleanser Peri-Wound Care Topical Santyl Collagenase Ointment, 30 (gm), tube Olivia Ramos, Olivia Ramos ([id number]) Discharge Instruction: apply nickel thick to wound bed only Primary Dressing Xeroform 4x4-HBD (in/in) Discharge Instruction: Apply Xeroform 4x4-HBD (in/in) as directed Secondary Dressing Coverlet Latex-Free Fabric Adhesive Dressings Discharge Instruction: 1.5 x 2 Secured With Compression Wrap Compression Stockings Add-Ons Electronic Signature(s) Signed: 01/13/2022 5:00:21 PM By: Baruch Gouty RN, BSN Entered By: Baruch Gouty on 01/13/2022 [place], Olivia Ramos ([id number]) -------------------------------------------------------------------------------- [place] Details Patient Name: Olivia Ramos Date of Service: 01/13/2022 4:00 PM Medical Record Number: [id number] Patient Account Number: [id number] Date of Birth/Sex: 11/05/1955 (65 y.o. F) Treating RN: Baruch Gouty Primary Care Messiah Rovira: Danae Orleans Other Clinician: Referring Reyes Fifield: Danae Orleans Treating Briggitte Boline/Extender: Skipper Cliche in Treatment: 11 Vital Signs Time Taken: 16:10 Temperature  (F): 98.3 Height (in): 60 Pulse (bpm): 92 Weight (lbs): 148 Respiratory Rate (breaths/min): 18 Body Mass Index (BMI): 28.9 Blood Pressure (mmHg): 164/92 Reference Range: 80 - 120 mg / dl Electronic Signature(s) Signed: 01/13/2022 5:00:21 PM By: Baruch Gouty RN, BSN Entered By: Baruch Gouty on 01/13/2022 16:11:05

## 2022-01-27 ENCOUNTER — Ambulatory Visit: Payer: BC Managed Care – PPO | Admitting: Physician Assistant

## 2022-01-30 ENCOUNTER — Encounter: Payer: BC Managed Care – PPO | Admitting: Physician Assistant

## 2022-01-30 DIAGNOSIS — E11622 Type 2 diabetes mellitus with other skin ulcer: Secondary | ICD-10-CM | POA: Diagnosis not present

## 2022-02-03 NOTE — Progress Notes (Signed)
Olivia Ramos, Olivia ([id number]) [id number] Page 1 of 8 Visit Report for 01/30/2022 Chief Complaint Document Details Patient Name: Date of Service: Olivia Ramos, [place] R[place] 01/30/2022 7:30 A M Medical Record Number: [id number] Patient Account Number: [id number] Date of Birth/Sex: Treating RN: [birth year] (66 y.o. Olivia Ramos) Woody, Kim Primary Care Provider: Jenell MillinerLuyando, Yvonne Other Clinician: Betha LoaVenable, Angie Referring Provider: Treating Provider/Extender: Bishop LimboStone, Yuridia Couts Luyando, Yvonne Weeks in Treatment: 14 Information Obtained from: Patient Chief Complaint Left 3rd finger ulcer Electronic Signature(s) Signed: 01/30/2022 1:31:59 PM By: Lenda KelpStone III, Khaalid Lefkowitz PA-C Entered By: Lenda KelpStone III, Kemara Quigley on 01/30/2022 07:47:58 -------------------------------------------------------------------------------- Debridement Details Patient Name: Date of Service: Olivia NDEZ, MA RIA 01/30/2022 7:30 A M Medical Record Number: [id number] Patient Account Number: [id number] Date of Birth/Sex: Treating RN: [birth year] (66 y.o. Cathlean CowerF) Woody, Kim Primary Care Provider: Jenell MillinerLuyando, Yvonne Other Clinician: Betha LoaVenable, Angie Referring Provider: Treating Provider/Extender: Bishop LimboStone, Ansh Fauble Luyando, Yvonne Weeks in Treatment: 14 Debridement Performed for Assessment: Wound #1 Left,Distal Hand - 3rd Digit Performed By: Physician Nelida MeuseStone, Unity Luepke E., PA-C Debridement Type: Debridement Level of Consciousness (Pre-procedure): Awake and Alert Pre-procedure Verification/Time Out Yes - 07:54 Taken: Start Time: 07:54 T Area Debrided (L x W): otal 0.3 (cm) x 0.3 (cm) = 0.09 (cm) Tissue and other material debrided: Viable, Non-Viable, Slough, Subcutaneous, Slough Level: Skin/Subcutaneous Tissue Debridement Description: Excisional Instrument: Curette Bleeding: Minimum Hemostasis Achieved: Pressure End Time: 07:57 Response to Treatment: Procedure was tolerated well Level of Consciousness (Post- Awake and Alert procedure): Olivia Ramos,  [place] ([id number]) [id number] Page 2 of 8 Post Debridement Measurements of Total Wound Length: (cm) 0.2 Width: (cm) 0.2 Depth: (cm) 0.2 Volume: (cm) 0.006 Character of Wound/Ulcer Post Debridement: Stable Post Procedure Diagnosis Same as Pre-procedure Electronic Signature(s) Signed: 01/30/2022 11:05:28 AM By: Elliot GurneyWoody, BSN, RN, CWS, Kim RN, BSN Signed: 01/30/2022 1:31:59 PM By: Lenda KelpStone III, Adylene Dlugosz PA-C Signed: 02/02/2022 5:32:10 PM By: Betha LoaVenable, Angie Entered By: Betha LoaVenable, Angie on 01/30/2022 07:59:24 -------------------------------------------------------------------------------- HPI Details Patient Name: Date of Service: Olivia GibneyHERNA NDEZ, MA RIA 01/30/2022 7:30 A M Medical Record Number: [id number] Patient Account Number: [id number] Date of Birth/Sex: Treating RN: [birth year] (66 y.o. Olivia Ramos) Woody, Kim Primary Care Provider: Jenell MillinerLuyando, Yvonne Other Clinician: Betha LoaVenable, Angie Referring Provider: Treating Provider/Extender: Dahlia BailiffStone, Briya Lookabaugh Luyando, Yvonne Weeks in Treatment: 14 History of Present Illness HPI Description: 10-24-2021 upon evaluation today patient appears to be doing somewhat poorly in regard to a wound on her finger. This occurred after she was pulling on a hangnail. Subsequently this is something that seem to come up all of a sudden and has been extremely painful for her. Fortunately there does not appear to be any signs of active systemically though locally that is a different story. There is actually some eschar noted and it is extremely painful to touch she has been taking meloxicam which was helping to some degree. With that being said she has been on Keflex but that did not seem to help at all. The patient was referred to our office for further evaluation and treatment. She does have a history of diabetes mellitus type 2 with hemoglobin A1c of 6.3 on 6-12- 2023. 11-07-2021 upon evaluation today patient appears to be doing really a little bit worse in my opinion  compared to last time I saw her. Fortunately I do not see any evidence of infection systemically but unfortunately she is still having what appears to be some infection locally. I do not think that there is any need for sending her to the ER or anything that drastic but I do believe switching her antibiotic and  switch her over to [facility] as well should probably be the best way to go at this point. 11-14-2021 upon evaluation today patient appears to be still having quite a bit of an issue with her finger at this point. Fortunately I do not see any signs of infection currently which is good news. With that being said unfortunately I do think she still having a lot of pain. I do not feel like there is any worsening overall and in fact I feel like the redness is improved to some degree compared to what it was. She is still on the Levaquin which I put her on for 14 days starting last week. I also did actually go ahead and give her a prescription as well for Santyl but we have been having a hard time getting this approved. In the end it was denied by insurance. I gave her the co-pay card today so this should be only $80. She has had multiple x-rays of the hand which show no signs of osteomyelitis. She continues to have significant issues with pain and would like a referral to pain management. Unfortunately there is anyone in [place] who does pain management I am wondering if we can possibly see if Dr. Vear Clock in [facility] would be willing to take her on for a short amount of time while we get the wound healed to try to help with the discomfort as this is continued to be an ongoing issue but unfortunately we do not prescribe any pain medications out of the wound care center. 11-24-2021 upon evaluation today patient appears to be doing well currently in regard to her pain she tells me is a little bit better still stating to be an 8 out of 10. I am not certain that the redness has really  improved much at all despite being placed on the Levaquin. She did go to the hospital the day after I saw her last in was given tramadol. With that being said she is still using the Santyl and has not heard from the pain clinic as of yet we did make that referral last week. They did do another x-ray of the ER and there is still no signs of osteomyelitis nonetheless considering how this is going and the pain that she has and I am going to go ahead and proceed with doing a MRI of the finger/hand in order to see if there is any evidence of osteomyelitis or other worsening at this point. Just does not seem to be progressing along the right track but I was hoping for as far as clearing away the eschar and hoping to get this to heal. 12-18-2021 upon evaluation today patient appears to be doing better in regard to her finger ulcer. This is actually showing signs of improvement which is great news there does not appear to be any signs of infection also good news. Fortunately I think she is on the right track here the Levaquin does seem to have taken out the infection which I believe in turn has helped tremendously with her pain. 12-26-2021 upon evaluation today patient appears to be doing well currently in regard to her wound this is actually softening up a lot as far as the eschar is concerned at this point. Fortunately I do not see any evidence of active infection locally or systemically at this time which is great news. I do think getting some of the eschar off would be good she actually wants it off she states that it  looks bad. She is seen with the help of an interpreter today. 01-13-2022 upon evaluation patient's wound bed actually showed signs of significant improvement is much smaller looks much better. I do feel like we need to try to clean away some of the necrotic debris today and she is in agreement with that plan. BRITNI, DRISCOLL ([id number]) [id number] Page 3 of  8 01-30-2022 upon evaluation today patient appears to be doing well currently in regard to her wound although there are still very small opening currently which I do believe needs to be addressed. Fortunately there does not appear to be any signs of significant infection which is great news in general. No fevers, chills, nausea, vomiting, or diarrhea. She tells me the pain is much better. Electronic Signature(s) Signed: 01/30/2022 8:05:05 AM By: Lenda Kelp PA-C Entered By: Lenda Kelp on 01/30/2022 08:05:05 -------------------------------------------------------------------------------- Physical Exam Details Patient Name: Date of Service: Olivia NDEZ, MA RIA 01/30/2022 7:30 A M Medical Record Number: [id number] Patient Account Number: [id number] Date of Birth/Sex: Treating RN: Apr 25, 1955 (66 y.o. Olivia Mayer Primary Care Provider: Jenell Milliner Other Clinician: Betha Loa Referring Provider: Treating Provider/Extender: Bishop Limbo Weeks in Treatment: 14 Constitutional Well-nourished and well-hydrated in no acute distress. Respiratory normal breathing without difficulty. Psychiatric this patient is able to make decisions and demonstrates good insight into disease process. Alert and Oriented x 3. pleasant and cooperative. Notes Upon inspection patient's wound bed actually showed signs of good granulation and epithelization at this point. There was some necrotic tissue that I did have to clearway today and she tolerated this with minimal discomfort postdebridement it looks like this is doing much better. Electronic Signature(s) Signed: 01/30/2022 8:07:14 AM By: Lenda Kelp PA-C Entered By: Lenda Kelp on 01/30/2022 08:07:14 -------------------------------------------------------------------------------- Physician Orders Details Patient Name: Date of Service: Olivia NDEZ, MA RIA 01/30/2022 7:30 A M Medical Record Number: [id number] Patient Account  Number: [id number] Date of Birth/Sex: Treating RN: 24-Sep-1955 (66 y.o. Cathlean Cower, Kim Primary Care Provider: Jenell Milliner Other Clinician: Betha Loa Referring Provider: Treating Provider/Extender: Dahlia Bailiff in Treatment: [phone] Verbal / Phone Orders: No Diagnosis Coding [place], Byrd Hesselbach ([id number]) [id number] Page 4 of 8 ICD-10 Coding Code Description L03.012 Cellulitis of left finger L98.492 Non-pressure chronic ulcer of skin of other sites with fat layer exposed E11.622 Type 2 diabetes mellitus with other skin ulcer I10 Essential (primary) hypertension Follow-up Appointments Return Appointment in 2 weeks. Bathing/ Shower/ Hygiene May shower; gently cleanse wound with antibacterial soap, rinse and pat dry prior to dressing wounds Wound Treatment Wound #1 - Hand - 3rd Digit Wound Laterality: Left, Distal Prim Dressing: Prisma 4.34 (in) 1 x Per Day/30 Days ary Discharge Instructions: Moisten w/normal saline or sterile water; Cover wound as directed. Do not remove from wound bed. Prim Dressing: Xeroform 4x4-HBD (in/in) 1 x Per Day/30 Days ary Discharge Instructions: Apply Xeroform 4x4-HBD (in/in) as directed Secondary Dressing: Coverlet Latex-Free Fabric Adhesive Dressings 1 x Per Day/30 Days Discharge Instructions: 1.5 x 2 Patient Medications llergies: lecithin, soy A Notifications Medication Indication Start End 01/30/2022 ibuprofen DOSE 1 - oral 800 mg tablet - 1 tablet oral taken 3 times per day as needed for severe pain Electronic Signature(s) Signed: 01/30/2022 8:39:32 AM By: Lenda Kelp PA-C Entered By: Lenda Kelp on 01/30/2022 08:39:32 -------------------------------------------------------------------------------- Problem List Details Patient Name: Date of Service: Olivia Gibney, MA RIA 01/30/2022 7:30 A M Medical Record Number: [id number] Patient Account Number: [id number] Date of Birth/Sex: Treating  RN: 05/10/1955 (66 y.o. [place] Primary Care Provider: Danae Orleans Other Clinician: Massie Kluver Referring Provider: Treating Provider/Extender: Maren Beach Weeks in Treatment: 14 Active Problems ICD-10 Encounter Code Description Active Date MDM Diagnosis L03.012 Cellulitis of left finger 10/24/2021 No Yes L98.492 Non-pressure chronic ulcer of skin of other sites with fat layer exposed 10/24/2021 No Yes [place], [place] ([id number]) [id number] Page 5 of 8 E11.622 Type 2 diabetes mellitus with other skin ulcer 10/24/2021 No Yes I10 Essential (primary) hypertension 10/24/2021 No Yes Inactive Problems Resolved Problems Electronic Signature(s) Signed: 01/30/2022 1:31:59 PM By: Worthy Keeler PA-C Entered By: Worthy Keeler on 01/30/2022 07:47:46 -------------------------------------------------------------------------------- Progress Note Details Patient Name: Date of Service: Olivia Ramos, Olivia Ramos RIA 01/30/2022 7:30 A M Medical Record Number: [id number] Patient Account Number: [id number] Date of Birth/Sex: Treating RN: 1955/06/15 (67 y.o. [place] Primary Care Provider: Danae Orleans Other Clinician: Massie Kluver Referring Provider: Treating Provider/Extender: Jannifer Franklin in Treatment: 14 Subjective Chief Complaint Information obtained from Patient Left 3rd finger ulcer History of Present Illness (HPI) 10-24-2021 upon evaluation today patient appears to be doing somewhat poorly in regard to a wound on her finger. This occurred after she was pulling on a hangnail. Subsequently this is something that seem to come up all of a sudden and has been extremely painful for her. Fortunately there does not appear to be any signs of active systemically though locally that is a different story. There is actually some eschar noted and it is extremely painful to touch she has been taking meloxicam which was helping  to some degree. With that being said she has been on Keflex but that did not seem to help at all. The patient was referred to our office for further evaluation and treatment. She does have a history of diabetes mellitus type 2 with hemoglobin A1c of 6.3 on 09-15-2021. 11-07-2021 upon evaluation today patient appears to be doing really a little bit worse in my opinion compared to last time I saw her. Fortunately I do not see any evidence of infection systemically but unfortunately she is still having what appears to be some infection locally. I do not think that there is any need for sending her to the ER or anything that drastic but I do believe switching her antibiotic and switch her over to [facility] as well should probably be the best way to go at this point. 11-14-2021 upon evaluation today patient appears to be still having quite a bit of an issue with her finger at this point. Fortunately I do not see any signs of infection currently which is good news. With that being said unfortunately I do think she still having a lot of pain. I do not feel like there is any worsening overall and in fact I feel like the redness is improved to some degree compared to what it was. She is still on the Levaquin which I put her on for 14 days starting last week. I also did actually go ahead and give her a prescription as well for Santyl but we have been having a hard time getting this approved. In the end it was denied by insurance. I gave her the co-pay card today so this should be only $80. She has had multiple x-rays of the hand which show no signs of osteomyelitis. She continues to have significant issues with pain and would like a referral to pain management. Unfortunately there is anyone in [place] who does pain management I  am wondering if we can possibly see if Dr. Vear Clock in [facility] would be willing to take her on for a short amount of time while we get the wound healed to try to help with  the discomfort as this is continued to be an ongoing issue but unfortunately we do not prescribe any pain medications out of the wound care center. 11-24-2021 upon evaluation today patient appears to be doing well currently in regard to her pain she tells me is a little bit better still stating to be an 8 out of 10. I am not certain that the redness has really improved much at all despite being placed on the Levaquin. She did go to the hospital the day after I saw her last in was given tramadol. With that being said she is still using the Santyl and has not heard from the pain clinic as of yet we did make that referral last week. They did do another x-ray of the ER and there is still no signs of osteomyelitis nonetheless considering how this is going and the pain that she has and I am going to go ahead and proceed with doing a MRI of the finger/hand in order to see if there is any evidence of osteomyelitis or other worsening at this point. Just does not seem to be progressing along the right track but I was hoping for as far as clearing away the eschar and hoping to get this to heal. 12-18-2021 upon evaluation today patient appears to be doing better in regard to her finger ulcer. This is actually showing signs of improvement which is great news there does not appear to be any signs of infection also good news. Fortunately I think she is on the right track here the Levaquin does seem to have taken out the infection which I believe in turn has helped tremendously with her pain. ALONDA, WEABER ([id number]) [id number] Page 6 of 8 12-26-2021 upon evaluation today patient appears to be doing well currently in regard to her wound this is actually softening up a lot as far as the eschar is concerned at this point. Fortunately I do not see any evidence of active infection locally or systemically at this time which is great news. I do think getting some of the eschar off would be good  she actually wants it off she states that it looks bad. She is seen with the help of an interpreter today. 01-13-2022 upon evaluation patient's wound bed actually showed signs of significant improvement is much smaller looks much better. I do feel like we need to try to clean away some of the necrotic debris today and she is in agreement with that plan. 01-30-2022 upon evaluation today patient appears to be doing well currently in regard to her wound although there are still very small opening currently which I do believe needs to be addressed. Fortunately there does not appear to be any signs of significant infection which is great news in general. No fevers, chills, nausea, vomiting, or diarrhea. She tells me the pain is much better. Objective Constitutional Well-nourished and well-hydrated in no acute distress. Vitals Time Taken: 7:40 AM, Height: 60 in, Weight: 148 lbs, BMI: 28.9, Temperature: 97.9 F, Pulse: 86 bpm, Respiratory Rate: 16 breaths/min, Blood Pressure: 167/90 mmHg. General Notes: patient states she has not taken her blood pressure medication this morning Respiratory normal breathing without difficulty. Psychiatric this patient is able to make decisions and demonstrates good insight into disease process. Alert and Oriented  x 3. pleasant and cooperative. General Notes: Upon inspection patient's wound bed actually showed signs of good granulation and epithelization at this point. There was some necrotic tissue that I did have to clearway today and she tolerated this with minimal discomfort postdebridement it looks like this is doing much better. Integumentary (Hair, Skin) Wound #1 status is Open. Original cause of wound was Gradually Appeared. The date acquired was: 08/04/2021. The wound has been in treatment 14 weeks. The wound is located on the Left,Distal Hand - 3rd Digit. The wound measures 0.1cm length x 0.1cm width x 0.1cm depth; 0.008cm^2 area and 0.001cm^3 volume. There is  Fat Layer (Subcutaneous Tissue) exposed. There is a medium amount of serosanguineous drainage noted. The wound margin is flat and intact. There is medium (34-66%) red granulation within the wound bed. There is a medium (34-66%) amount of necrotic tissue within the wound bed. Assessment Active Problems ICD-10 Cellulitis of left finger Non-pressure chronic ulcer of skin of other sites with fat layer exposed Type 2 diabetes mellitus with other skin ulcer Essential (primary) hypertension Procedures Wound #1 Pre-procedure diagnosis of Wound #1 is an Infection - not elsewhere classified located on the Left,Distal Hand - 3rd Digit . There was a Excisional Skin/Subcutaneous Tissue Debridement with a total area of 0.09 sq cm performed by Nelida Meuse., PA-C. With the following instrument(s): Curette to remove Viable and Non-Viable tissue/material. Material removed includes Subcutaneous Tissue and Slough and. A time out was conducted at 07:54, prior to the start of the procedure. A Minimum amount of bleeding was controlled with Pressure. The procedure was tolerated well. Post Debridement Measurements: 0.2cm length x 0.2cm width x 0.2cm depth; 0.006cm^3 volume. Character of Wound/Ulcer Post Debridement is stable. Post procedure Diagnosis Wound #1: Same as Pre-Procedure Plan Follow-up Appointments: Return Appointment in 2 weeks. Bathing/ Shower/ Hygiene: May shower; gently cleanse wound with antibacterial soap, rinse and pat dry prior to dressing wounds The following medication(s) was prescribed: ibuprofen oral 800 mg tablet 1 1 tablet oral taken 3 times per day as needed for severe pain starting 01/30/2022 HAADIYA, FROGGE ([id number]) [id number] Page 7 of 8 WOUND #1: - Hand - 3rd Digit Wound Laterality: Left, Distal Prim Dressing: Prisma 4.34 (in) 1 x Per Day/30 Days ary Discharge Instructions: Moisten w/normal saline or sterile water; Cover wound as directed. Do not  remove from wound bed. Prim Dressing: Xeroform 4x4-HBD (in/in) 1 x Per Day/30 Days ary Discharge Instructions: Apply Xeroform 4x4-HBD (in/in) as directed Secondary Dressing: Coverlet Latex-Free Fabric Adhesive Dressings 1 x Per Day/30 Days Discharge Instructions: 1.5 x 2 1. I would recommend currently that we go ahead and continue with the wound care measures as before and the patient is in agreement with the plan. This includes the use of the Xeroform the recommend discontinue the Santyl and actually can use some collagen down to the wound bed first to try to help get this to fill-in. I do think the Santyl has done a good job up to this point however. 2. I would recommend that she change this probably daily just due to the fact that it is on her finger and has a difficult spot to keep the dressing on anyway at the tip. We will see patient back for reevaluation in 1 week here in the clinic. If anything worsens or changes patient will contact our office for additional recommendations. Electronic Signature(s) Signed: 01/30/2022 8:44:21 AM By: Lenda Kelp PA-C Previous Signature: 01/30/2022 8:39:43 AM Version By: Lenda Kelp PA-C  Previous Signature: 01/30/2022 8:09:10 AM Version By: Lenda Kelp PA-C Entered By: Lenda Kelp on 01/30/2022 08:44:21 -------------------------------------------------------------------------------- SuperBill Details Patient Name: Date of Service: Olivia NDEZ, MA RIA 01/30/2022 Medical Record Number: [id number] Patient Account Number: [id number] Date of Birth/Sex: Treating RN: 1956/03/10 (66 y.o. Cathlean Cower, Kim Primary Care Provider: Jenell Milliner Other Clinician: Betha Loa Referring Provider: Treating Provider/Extender: Bishop Limbo Weeks in Treatment: 14 Diagnosis Coding ICD-10 Codes Code Description L03.012 Cellulitis of left finger L98.492 Non-pressure chronic ulcer of skin of other sites with fat layer exposed E11.622  Type 2 diabetes mellitus with other skin ulcer I10 Essential (primary) hypertension Facility Procedures : CPT4 Code: [id number] Description: 11042 - DEB SUBQ TISSUE 20 SQ CM/< ICD-10 Diagnosis Description L98.492 Non-pressure chronic ulcer of skin of other sites with fat layer exposed Modifier: Quantity: 1 Physician Procedures : CPT4 Code Description Modifier [id number] - WC PHYS LEVEL 4 - EST PT 25 ICD-10 Diagnosis Description L03.012 Cellulitis of left finger L98.492 Non-pressure chronic ulcer of skin of other sites with fat layer exposed E11.622 Type 2 diabetes mellitus  with other skin ulcer I10 Essential (primary) hypertension Quantity: 1 : [id number] - WC PHYS SUBQ TISS 20 SQ CM Cartaya, Dorthea ([id number]) [id number] Pag ICD-10 Diagnosis Description L98.492 Non-pressure chronic ulcer of skin of other sites with fat layer exposed Quantity: 1 e 8 of 8 Electronic Signature(s) Signed: 01/30/2022 8:39:59 AM By: Lenda Kelp PA-C Previous Signature: 01/30/2022 8:09:16 AM Version By: Lenda Kelp PA-C Entered By: Lenda Kelp on 01/30/2022 08:39:58

## 2022-02-03 NOTE — Progress Notes (Signed)
Ramos, Olivia ([id number]) [id number] Page 1 of 8 Visit Report for 01/30/2022 Arrival Information Details Patient Name: Date of Service: Olivia Ramos, Olivia Ramos 01/30/2022 7:30 A M Medical Record Number: [id number] Patient Account Number: [id number] Date of Birth/Sex: Treating RN: 1955/11/18 (66 y.o. Olivia Ramos, Olivia Ramos Primary Care Olivia Ramos: Olivia Ramos Other Clinician: Massie Ramos Referring Olivia Ramos: Treating Olivia Ramos/Extender: Olivia Ramos in Treatment: 12 Visit Information History Since Last Visit All ordered tests and consults were completed: No Patient Arrived: Ambulatory Added or deleted any medications: No Arrival Time: 07:39 Any new allergies or adverse reactions: No Transfer Assistance: None Had a fall or experienced change in No Patient Requires Transmission-Based Precautions: No activities of daily living that may affect Patient Has Alerts: No risk of falls: Signs or symptoms of abuse/neglect since last visito No Hospitalized since last visit: No Pain Present Now: Yes Electronic Signature(s) Signed: 02/02/2022 5:32:10 PM By: Olivia Ramos Entered By: Olivia Ramos on 01/30/2022 07:39:58 -------------------------------------------------------------------------------- Clinic Level of Care Assessment Details Patient Name: Date of Service: Olivia Ramos, [place] Ramos 01/30/2022 7:30 A M Medical Record Number: [id number] Patient Account Number: [id number] Date of Birth/Sex: Treating RN: 09-18-1955 (66 y.o. Olivia Ramos Primary Care Olivia Ramos: Olivia Ramos Other Clinician: Massie Ramos Referring Olivia Ramos: Treating Olivia Ramos/Extender: Olivia Ramos in Treatment: 14 Clinic Level of Care Assessment Items TOOL 1 Quantity Score _0  - 0 Use when EandM and Procedure is performed on INITIAL visit ASSESSMENTS - Nursing Assessment / Reassessment _1  - 0 General Physical Exam (combine w/ comprehensive assessment  (listed just below) when performed on new pt. evals) _2  - 0 Comprehensive Assessment (HX, ROS, Risk Assessments, Wounds Hx, etc.) ASSESSMENTS - Wound and Skin Assessment / Reassessment _3  - 0 Dermatologic / Skin Assessment (not related to wound area) Olivia Ramos ([id number]) [id number] Page 2 of 8 ASSESSMENTS - Ostomy and/or Continence Assessment and Care _4  - 0 Incontinence Assessment and Management _5  - 0 Ostomy Care Assessment and Management (repouching, etc.) PROCESS - Coordination of Care _6  - 0 Simple Patient / Family Education for ongoing care _7  - 0 Complex (extensive) Patient / Family Education for ongoing care _8  - 0 Staff obtains Programmer, systems, Records, T Results / Process Orders est _9  - 0 Staff telephones HHA, Nursing Homes / Clarify orders / etc _10  - 0 Routine Transfer to another Facility (non-emergent condition) _11  - 0 Routine Hospital Admission (non-emergent condition) _12  - 0 New Admissions / Biomedical engineer / Ordering NPWT Apligraf, etc. , _13  - 0 Emergency Hospital Admission (emergent condition) PROCESS - Special Needs _14  - 0 Pediatric / Minor Patient Management _15  - 0 Isolation Patient Management _16  - 0 Hearing / Language / Visual special needs _17  - 0 Assessment of Community assistance (transportation, D/C planning, etc.) _18  - 0 Additional assistance / Altered mentation _19  - 0 Support Surface(s) Assessment (bed, cushion, seat, etc.) INTERVENTIONS - Miscellaneous _20  - 0 External ear exam _21  - 0 Patient Transfer (multiple staff / Civil Service fast streamer / Similar devices) _22  - 0 Simple Staple / Suture removal (25 or less) _23  - 0 Complex Staple / Suture removal (26 or more) _24  - 0 Hypo/Hyperglycemic Management (do not check if billed separately) _25  - 0 Ankle / Brachial Index (ABI) - do not check if billed separately Has the patient been seen at the hospital within the last three years: Yes Total Score: 0 Level Of Care:  ____ Electronic Signature(s) Signed: 02/02/2022 5:32:10 PM By: Olivia Ramos Entered By: Olivia Ramos on 01/30/2022 07:59:58 -------------------------------------------------------------------------------- Encounter  Discharge Information Details Patient Name: Date of Service: Olivia Ramos, Olivia Ramos 01/30/2022 7:30 A M Medical Record Number: [id number] Patient Account Number: [id number] Date of Birth/Sex: Treating RN: 01-17-1956 (66 y.o. Olivia Ramos, Olivia Ramos Primary Care Olivia Ramos: Olivia Ramos Other Clinician: Massie Ramos Referring Olivia Ramos: Treating Olivia Ramos/Extender: Olivia Ramos in Treatment: 14 Encounter Discharge Information Items Post Procedure [place], [place] ([id number]) [id number] Page 3 of 8 Discharge Condition: Stable Temperature (F): 97.9 Ambulatory Status: Ambulatory Pulse (bpm): 86 Discharge Destination: Home Respiratory Rate (breaths/min): 18 Transportation: Private Auto Blood Pressure (mmHg): 167/90 Accompanied By: self Schedule Follow-up Appointment: Yes Clinical Summary of Care: Electronic Signature(s) Signed: 02/02/2022 5:32:10 PM By: Olivia Ramos Entered By: Olivia Ramos on 01/30/2022 08:08:22 -------------------------------------------------------------------------------- Lower Extremity Assessment Details Patient Name: Date of Service: HERNA NDEZ, [place] Ramos 01/30/2022 7:30 A M Medical Record Number: [id number] Patient Account Number: [id number] Date of Birth/Sex: Treating RN: 1956/02/06 (66 y.o. Olivia Ramos Primary Care Andrick Rust: Olivia Ramos Other Clinician: Massie Ramos Referring Prince Olivier: Treating Manju Kulkarni/Extender: Olivia Ramos in Treatment: 14 Electronic Signature(s) Signed: 01/30/2022 11:05:28 AM By: Gretta Cool BSN, RN, CWS, Kim RN, BSN Signed: 02/02/2022 5:32:10 PM By: Olivia Ramos Entered By: Olivia Ramos on 01/30/2022  07:47:31 -------------------------------------------------------------------------------- Multi Wound Chart Details Patient Name: Date of Service: Olivia Ramos, [place] Ramos 01/30/2022 7:30 A M Medical Record Number: [id number] Patient Account Number: [id number] Date of Birth/Sex: Treating RN: 30-Apr-1955 (65 y.o. Olivia Ramos, Olivia Ramos Primary Care Jada Kuhnert: Olivia Ramos Other Clinician: Massie Ramos Referring Euphemia Lingerfelt: Treating Oluwadamilola Deliz/Extender: Maren Beach Weeks in Treatment: 14 Vital Signs Height(in): 60 Pulse(bpm): 86 Weight(lbs): 148 Blood Pressure(mmHg): 167/90 Body Mass Index(BMI): 28.9 Temperature(F): 97.9 Respiratory Rate(breaths/min): 16 [1:Photos:] [N/A:N/A] Left, Distal Hand - 3rd Digit N/A N/A Wound Location: Gradually Appeared N/A N/A Wounding Event: Infection - not elsewhere classified N/A N/A Primary Etiology: Asthma, Hypertension, Type II N/A N/A Comorbid History: Diabetes 08/04/2021 N/A N/A Date Acquired: 14 N/A N/A Weeks of Treatment: Open N/A N/A Wound Status: No N/A N/A Wound Recurrence: 0.1x0.1x0.1 N/A N/A Measurements L x W x D (cm) 0.008 N/A N/A A (cm) : rea 0.001 N/A N/A Volume (cm) : 94.90% N/A N/A % Reduction in Area: 93.80% N/A N/A % Reduction in Volume: Full Thickness Without Exposed N/A N/A Classification: Support Structures Medium N/A N/A Exudate Amount: Serosanguineous N/A N/A Exudate Type: red, brown N/A N/A Exudate Color: Flat and Intact N/A N/A Wound Margin: Medium (34-66%) N/A N/A Granulation Amount: Red N/A N/A Granulation Quality: Medium (34-66%) N/A N/A Necrotic Amount: Fat Layer (Subcutaneous Tissue): Yes N/A N/A Exposed Structures: Fascia: No Tendon: No Muscle: No Joint: No Bone: No Medium (34-66%) N/A N/A Epithelialization: Treatment Notes Electronic Signature(s) Signed: 02/02/2022 5:32:10 PM By: Olivia Ramos Entered By: Olivia Ramos on 01/30/2022  07:47:51 -------------------------------------------------------------------------------- Multi-Disciplinary Care Plan Details Patient Name: Date of Service: Olivia Der, [place] Ramos 01/30/2022 7:30 A M Medical Record Number: [id number] Patient Account Number: [id number] Date of Birth/Sex: Treating RN: 1955/10/15 (66 y.o. Olivia Ramos Primary Care Rosalin Buster: Olivia Ramos Other Clinician: Massie Ramos Referring Jeremiah Tarpley: Treating Chrishana Spargur/Extender: Olivia Ramos in Treatment: [place] reviewed with physician Active Inactive Necrotic Tissue Nursing Diagnoses: Impaired tissue integrity related to necrotic/devitalized tissue Knowledge deficit related to management of necrotic/devitalized tissue Roseman, Caralee ([id number]) [id number] Page 5 of 8 Goals: Necrotic/devitalized tissue will be minimized in the wound bed Date Initiated: 12/26/2021 Target Resolution Date: 01/23/2022 Goal Status: Active Patient/caregiver will verbalize understanding of reason and process for debridement of necrotic  tissue Date Initiated: 12/26/2021 Target Resolution Date: 01/23/2022 Goal Status: Active Interventions: Assess patient pain level pre-, during and post procedure and prior to discharge Treatment Activities: Apply topical anesthetic as ordered : 12/26/2021 Excisional debridement : 12/26/2021 Notes: Wound/Skin Impairment Nursing Diagnoses: Knowledge deficit related to ulceration/compromised skin integrity Goals: Patient/caregiver will verbalize understanding of skin care regimen Date Initiated: 10/24/2021 Target Resolution Date: 01/23/2022 Goal Status: Active Ulcer/skin breakdown will have a volume reduction of 30% by week 4 Date Initiated: 10/24/2021 Date Inactivated: 01/13/2022 Target Resolution Date: 11/24/2021 Goal Status: Met Ulcer/skin breakdown will have a volume reduction of 50% by week 8 Date Initiated: 10/24/2021 Date  Inactivated: 01/13/2022 Target Resolution Date: 12/25/2021 Goal Status: Met Ulcer/skin breakdown will have a volume reduction of 80% by week 12 Date Initiated: 10/24/2021 Target Resolution Date: 01/24/2022 Goal Status: Active Ulcer/skin breakdown will heal within 14 weeks Date Initiated: 10/24/2021 Target Resolution Date: 02/24/2022 Goal Status: Active Interventions: Assess patient/caregiver ability to obtain necessary supplies Assess patient/caregiver ability to perform ulcer/skin care regimen upon admission and as needed Assess ulceration(s) every visit Notes: Electronic Signature(s) Signed: 01/30/2022 11:05:28 AM By: Gretta Cool, BSN, RN, CWS, Kim RN, BSN Signed: 02/02/2022 5:32:10 PM By: Olivia Ramos Entered By: Olivia Ramos on 01/30/2022 07:47:35 -------------------------------------------------------------------------------- Pain Assessment Details Patient Name: Date of Service: Olivia Ramos, [place] Ramos 01/30/2022 7:30 A M Medical Record Number: [id number] Patient Account Number: [id number] Date of Birth/Sex: Treating RN: Aug 07, 1955 (65 y.o. Olivia Ramos Primary Care Nayzeth Altman: Olivia Ramos Other Clinician: Massie Ramos Referring Luisalberto Beegle: Treating Tkeya Stencil/Extender: Olivia Ramos in Treatment: [place], [place] ([id number]) [id number] Page 6 of 8 Location of Pain Severity and Description of Pain Patient Has Paino Yes Site Locations Pain Location: Pain in Ulcers Duration of the Pain. Constant / Intermittento Constant Rate the pain. Current Pain Level: 4 Character of Pain Describe the Pain: Aching Pain Management and Medication Current Pain Management: Medication: Yes Rest: Yes Electronic Signature(s) Signed: 01/30/2022 11:05:28 AM By: Gretta Cool, BSN, RN, CWS, Kim RN, BSN Signed: 02/02/2022 5:32:10 PM By: Olivia Ramos Entered By: Olivia Ramos on 01/30/2022  07:44:01 -------------------------------------------------------------------------------- Patient/Caregiver Education Details Patient Name: Date of Service: Olivia Der, MA Ramos 10/27/2023andnbsp7:30 [place] Record Number: [id number] Patient Account Number: [id number] Date of Birth/Gender: Treating RN: 1955-08-08 (66 y.o. Olivia Ramos Primary Care Physician: Olivia Ramos Other Clinician: Massie Ramos Referring Physician: Treating Physician/Extender: Olivia Ramos in Treatment: 14 Education Assessment Education Provided To: Patient Education Topics Provided Wound/Skin Impairment: Handouts: Other: continue wound care as directed Methods: Explain/Verbal Responses: State content correctly Electronic Signature(s) Signed: 02/02/2022 5:32:10 PM By: Olivia Ramos Entered By: Olivia Ramos on 01/30/2022 08:00:29 Haji, Kieu ([id number]) [id number] Page 7 of 8 -------------------------------------------------------------------------------- Wound Assessment Details Patient Name: Date of Service: Olivia Der, MA Ramos 01/30/2022 7:30 A M Medical Record Number: [id number] Patient Account Number: [id number] Date of Birth/Sex: Treating RN: [birth year] (66 y.o. Olivia Ramos, Olivia Ramos Primary Care Clarabell Matsuoka: Olivia Ramos Other Clinician: Massie Ramos Referring Kamyah Wilhelmsen: Treating Arhaan Chesnut/Extender: Maren Beach Weeks in Treatment: 14 Wound Status Wound Number: 1 Primary Etiology: Infection - not elsewhere classified Wound Location: Left, Distal Hand - 3rd Digit Wound Status: Open Wounding Event: Gradually Appeared Comorbid History: Asthma, Hypertension, Type II Diabetes Date Acquired: 08/04/2021 Weeks Of Treatment: 14 Clustered Wound: No Photos Wound Measurements Length: (cm) Width: (cm) Depth: (cm) Area: (cm) Volume: (cm) 0.1 % Reduction in Area: 94.9% 0.1 % Reduction in Volume: 93.8% 0.1 Epithelialization:  Medium (34-66%) 0.008 0.001 Wound Description  Classification: Full Thickness Without Exposed Support Wound Margin: Flat and Intact Exudate Amount: Medium Exudate Type: Serosanguineous Exudate Color: red, brown Structures Foul Odor After Cleansing: No Slough/Fibrino Yes Wound Bed Granulation Amount: Medium (34-66%) Exposed Structure Granulation Quality: Red Fascia Exposed: No Necrotic Amount: Medium (34-66%) Fat Layer (Subcutaneous Tissue) Exposed: Yes Tendon Exposed: No Muscle Exposed: No Joint Exposed: No Bone Exposed: No Treatment Notes Wound #1 (Hand - 3rd Digit) Wound Laterality: Left, Distal Cleanser Ballengee, Adelyn ([id number]) [id number] Page 8 of 8 Peri-Wound Care Topical Primary Dressing Prisma 4.34 (in) Discharge Instruction: Moisten w/normal saline or sterile water; Cover wound as directed. Do not remove from wound bed. Xeroform 4x4-HBD (in/in) Discharge Instruction: Apply Xeroform 4x4-HBD (in/in) as directed Secondary Dressing Coverlet Latex-Free Fabric Adhesive Dressings Discharge Instruction: 1.5 x 2 Secured With Compression Wrap Compression Stockings Add-Ons Electronic Signature(s) Signed: 01/30/2022 11:05:28 AM By: Gretta Cool, BSN, RN, CWS, Kim RN, BSN Signed: 02/02/2022 5:32:10 PM By: Olivia Ramos Entered By: Olivia Ramos on 01/30/2022 07:55:02 -------------------------------------------------------------------------------- Vitals Details Patient Name: Date of Service: Olivia Ramos, [place] Ramos 01/30/2022 7:30 A M Medical Record Number: [id number] Patient Account Number: [id number] Date of Birth/Sex: Treating RN: 12-Jan-1956 (66 y.o. Olivia Ramos, Olivia Ramos Primary Care Janie Strothman: Olivia Ramos Other Clinician: Massie Ramos Referring Lether Tesch: Treating Tiney Zipper/Extender: Maren Beach Weeks in Treatment: 14 Vital Signs Time Taken: 07:40 Temperature (F): 97.9 Height (in): 60 Pulse (bpm): 86 Weight (lbs):  148 Respiratory Rate (breaths/min): 16 Body Mass Index (BMI): 28.9 Blood Pressure (mmHg): 167/90 Reference Range: 80 - 120 mg / dl Notes patient states she has not taken her blood pressure medication this morning Electronic Signature(s) Signed: 02/02/2022 5:32:10 PM By: Olivia Ramos Entered By: Olivia Ramos on 01/30/2022 07:43:57

## 2022-02-06 ENCOUNTER — Ambulatory Visit: Payer: BC Managed Care – PPO | Admitting: Physician Assistant

## 2022-02-13 ENCOUNTER — Encounter: Payer: BC Managed Care – PPO | Attending: Physician Assistant | Admitting: Internal Medicine

## 2022-02-13 DIAGNOSIS — L98492 Non-pressure chronic ulcer of skin of other sites with fat layer exposed: Secondary | ICD-10-CM | POA: Diagnosis not present

## 2022-02-13 DIAGNOSIS — L03012 Cellulitis of left finger: Secondary | ICD-10-CM | POA: Diagnosis present

## 2022-02-13 DIAGNOSIS — I10 Essential (primary) hypertension: Secondary | ICD-10-CM | POA: Diagnosis not present

## 2022-02-13 DIAGNOSIS — E11622 Type 2 diabetes mellitus with other skin ulcer: Secondary | ICD-10-CM | POA: Diagnosis not present

## 2022-02-17 NOTE — Progress Notes (Signed)
AYLANIE, CUBILLOS ([id number]) [id number] Page 1 of 5 Visit Report for 02/13/2022 HPI Details Patient Name: Date of Service: Olivia Ramos, [place] RIA 02/13/2022 7:30 A M Medical Record Number: [id number] Patient Account Number: [id number] Date of Birth/Sex: Treating RN: [birth year] (66 y.o. Skip Mayer Primary Care Provider: Jenell Milliner Other Clinician: Betha Loa Referring Provider: Treating Provider/Extender: RO BSO N, MICHA EL Chauncey Fischer in Treatment: 16 History of Present Illness HPI Description: 10-24-2021 upon evaluation today patient appears to be doing somewhat poorly in regard to a wound on her finger. This occurred after she was pulling on a hangnail. Subsequently this is something that seem to come up all of a sudden and has been extremely painful for her. Fortunately there does not appear to be any signs of active systemically though locally that is a different story. There is actually some eschar noted and it is extremely painful to touch she has been taking meloxicam which was helping to some degree. With that being said she has been on Keflex but that did not seem to help at all. The patient was referred to our office for further evaluation and treatment. She does have a history of diabetes mellitus type 2 with hemoglobin A1c of 6.3 on 6-12- 2023. 11-07-2021 upon evaluation today patient appears to be doing really a little bit worse in my opinion compared to last time I saw her. Fortunately I do not see any evidence of infection systemically but unfortunately she is still having what appears to be some infection locally. I do not think that there is any need for sending her to the ER or anything that drastic but I do believe switching her antibiotic and switch her over to [facility] as well should probably be the best way to go at this point. 11-14-2021 upon evaluation today patient appears to be still having quite a bit of an issue with her  finger at this point. Fortunately I do not see any signs of infection currently which is good news. With that being said unfortunately I do think she still having a lot of pain. I do not feel like there is any worsening overall and in fact I feel like the redness is improved to some degree compared to what it was. She is still on the Levaquin which I put her on for 14 days starting last week. I also did actually go ahead and give her a prescription as well for Santyl but we have been having a hard time getting this approved. In the end it was denied by insurance. I gave her the co-pay card today so this should be only $80. She has had multiple x-rays of the hand which show no signs of osteomyelitis. She continues to have significant issues with pain and would like a referral to pain management. Unfortunately there is anyone in [place] who does pain management I am wondering if we can possibly see if Dr. Vear Clock in [facility] would be willing to take her on for a short amount of time while we get the wound healed to try to help with the discomfort as this is continued to be an ongoing issue but unfortunately we do not prescribe any pain medications out of the wound care center. 11-24-2021 upon evaluation today patient appears to be doing well currently in regard to her pain she tells me is a little bit better still stating to be an 8 out of 10. I am not certain that the redness has really improved  much at all despite being placed on the Levaquin. She did go to the hospital the day after I saw her last in was given tramadol. With that being said she is still using the Santyl and has not heard from the pain clinic as of yet we did make that referral last week. They did do another x-ray of the ER and there is still no signs of osteomyelitis nonetheless considering how this is going and the pain that she has and I am going to go ahead and proceed with doing a MRI of the finger/hand in order  to see if there is any evidence of osteomyelitis or other worsening at this point. Just does not seem to be progressing along the right track but I was hoping for as far as clearing away the eschar and hoping to get this to heal. 12-18-2021 upon evaluation today patient appears to be doing better in regard to her finger ulcer. This is actually showing signs of improvement which is great news there does not appear to be any signs of infection also good news. Fortunately I think she is on the right track here the Levaquin does seem to have taken out the infection which I believe in turn has helped tremendously with her pain. 12-26-2021 upon evaluation today patient appears to be doing well currently in regard to her wound this is actually softening up a lot as far as the eschar is concerned at this point. Fortunately I do not see any evidence of active infection locally or systemically at this time which is great news. I do think getting some of the eschar off would be good she actually wants it off she states that it looks bad. She is seen with the help of an interpreter today. 01-13-2022 upon evaluation patient's wound bed actually showed signs of significant improvement is much smaller looks much better. I do feel like we need to try to clean away some of the necrotic debris today and she is in agreement with that plan. 01-30-2022 upon evaluation today patient appears to be doing well currently in regard to her wound although there are still very small opening currently which I do believe needs to be addressed. Fortunately there does not appear to be any signs of significant infection which is great news in general. No fevers, chills, nausea, vomiting, or diarrhea. She tells me the pain is much better. 11/10; patient comes in and her area on the fourth finger is actually healed. 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 The patient works in a [employer]  her hands are not in a wet environment although she does get chemicals and powders on them. Electronic Signature(s) Signed: 02/13/2022 12:57:19 PM By: Baltazar Najjar MD Entered By: Baltazar Najjar on 02/13/2022 08:01:42 [name] ([id number]) [id number] Page 2 of 5 -------------------------------------------------------------------------------- Physical Exam Details Patient Name: Date of Service: Olivia Gibney, [place] RIA 02/13/2022 7:30 A M Medical Record Number: [id number] Patient Account Number: [id number] Date of Birth/Sex: Treating RN: September 13, 1955 (66 y.o. Skip Mayer Primary Care Provider: Jenell Milliner Other Clinician: Betha Loa Referring Provider: Treating Provider/Extender: RO BSO N, MICHA EL Chauncey Fischer in Treatment: 16 Constitutional Patient is hypertensive.. Pulse regular and within target range for patient.Marland Kitchen Respirations regular, non-labored and within target range.. Temperature is normal and within the target range for the patient.Marland Kitchen appears in no distress. Notes Wound exam; fourth finger on the left. Lateral aspect this is completely healed. I looked  back through the pictures and I healed. At 1 point this was clearly an expanding area. She did get cultured and treated with antibiotics which seem to have helped Electronic Signature(s) Signed: 02/13/2022 12:57:19 PM By: Baltazar Najjar MD Entered By: Baltazar Najjar on 02/13/2022 08:02:30 -------------------------------------------------------------------------------- Physician Orders Details Patient Name: Date of Service: Olivia Gibney, [place] RIA 02/13/2022 7:30 A M Medical Record Number: [id number] Patient Account Number: [id number] Date of Birth/Sex: Treating RN: [birth year] (66 y.o. Skip Mayer Primary Care Provider: Jenell Milliner Other Clinician: Betha Loa Referring Provider: Treating Provider/Extender: RO BSO N, MICHA EL Chauncey Fischer in Treatment:  16 Verbal / Phone Orders: No Diagnosis Coding Discharge From [facility] Discharge from Wound Care Center Treatment Complete - Your wound has healed. Please call if any further issues arise Electronic Signature(s) Signed: 02/13/2022 12:57:19 PM By: Baltazar Najjar MD Signed: 02/17/2022 7:52:55 AM By: Betha Loa Entered By: Betha Loa on 02/13/2022 07:59:14 Fraiser, Bryauna ([id number]) [id number] Page 3 of 5 -------------------------------------------------------------------------------- Problem List Details Patient Name: Date of Service: Olivia Ramos, [place] RIA 02/13/2022 7:30 A M Medical Record Number: [id number] Patient Account Number: [id number] Date of Birth/Sex: Treating RN: 1955-12-15 (66 y.o. Skip Mayer Primary Care Provider: Jenell Milliner Other Clinician: Betha Loa Referring Provider: Treating Provider/Extender: RO BSO N, MICHA EL Chauncey Fischer in Treatment: 16 Active Problems ICD-10 Encounter Code Description Active Date MDM Diagnosis L03.012 Cellulitis of left finger 10/24/2021 No Yes L98.492 Non-pressure chronic ulcer of skin of other sites with fat layer exposed 10/24/2021 No Yes E11.622 Type 2 diabetes mellitus with other skin ulcer 10/24/2021 No Yes I10 Essential (primary) hypertension 10/24/2021 No Yes Inactive Problems Resolved Problems Electronic Signature(s) Signed: 02/13/2022 12:57:19 PM By: Baltazar Najjar MD Entered By: Baltazar Najjar on 02/13/2022 08:00:22 -------------------------------------------------------------------------------- Progress Note Details Patient Name: Date of Service: Olivia Gibney, [place] RIA 02/13/2022 7:30 A M Medical Record Number: [id number] Patient Account Number: [id number] Date of Birth/Sex: Treating RN: 26-Mar-1956 (66 y.o. Skip Mayer Primary Care Provider: Jenell Milliner Other Clinician: Betha Loa Referring Provider: Treating Provider/Extender: Chauncey Mann, MICHA EL  Chauncey Fischer in Treatment: [address] [place] ([id number]) [id number] Page 4 of 5 Subjective History of Present Illness (HPI) 10-24-2021 upon evaluation today patient appears to be doing somewhat poorly in regard to a wound on her finger. This occurred after she was pulling on a hangnail. Subsequently this is something that seem to come up all of a sudden and has been extremely painful for her. Fortunately there does not appear to be any signs of active systemically though locally that is a different story. There is actually some eschar noted and it is extremely painful to touch she has been taking meloxicam which was helping to some degree. With that being said she has been on Keflex but that did not seem to help at all. The patient was referred to our office for further evaluation and treatment. She does have a history of diabetes mellitus type 2 with hemoglobin A1c of 6.3 on 09-15-2021. 11-07-2021 upon evaluation today patient appears to be doing really a little bit worse in my opinion compared to last time I saw her. Fortunately I do not see any evidence of infection systemically but unfortunately she is still having what appears to be some infection locally. I do not think that there is any need for sending her to the ER or anything that drastic but I do believe switching her antibiotic and switch her over to [facility] as well  should probably be the best way to go at this point. 11-14-2021 upon evaluation today patient appears to be still having quite a bit of an issue with her finger at this point. Fortunately I do not see any signs of infection currently which is good news. With that being said unfortunately I do think she still having a lot of pain. I do not feel like there is any worsening overall and in fact I feel like the redness is improved to some degree compared to what it was. She is still on the Levaquin which I put her on for 14 days starting last  week. I also did actually go ahead and give her a prescription as well for Santyl but we have been having a hard time getting this approved. In the end it was denied by insurance. I gave her the co-pay card today so this should be only $80. She has had multiple x-rays of the hand which show no signs of osteomyelitis. She continues to have significant issues with pain and would like a referral to pain management. Unfortunately there is anyone in [place] who does pain management I am wondering if we can possibly see if Dr. Vear Clock in [facility] would be willing to take her on for a short amount of time while we get the wound healed to try to help with the discomfort as this is continued to be an ongoing issue but unfortunately we do not prescribe any pain medications out of the wound care center. 11-24-2021 upon evaluation today patient appears to be doing well currently in regard to her pain she tells me is a little bit better still stating to be an 8 out of 10. I am not certain that the redness has really improved much at all despite being placed on the Levaquin. She did go to the hospital the day after I saw her last in was given tramadol. With that being said she is still using the Santyl and has not heard from the pain clinic as of yet we did make that referral last week. They did do another x-ray of the ER and there is still no signs of osteomyelitis nonetheless considering how this is going and the pain that she has and I am going to go ahead and proceed with doing a MRI of the finger/hand in order to see if there is any evidence of osteomyelitis or other worsening at this point. Just does not seem to be progressing along the right track but I was hoping for as far as clearing away the eschar and hoping to get this to heal. 12-18-2021 upon evaluation today patient appears to be doing better in regard to her finger ulcer. This is actually showing signs of improvement which is  great news there does not appear to be any signs of infection also good news. Fortunately I think she is on the right track here the Levaquin does seem to have taken out the infection which I believe in turn has helped tremendously with her pain. 12-26-2021 upon evaluation today patient appears to be doing well currently in regard to her wound this is actually softening up a lot as far as the eschar is concerned at this point. Fortunately I do not see any evidence of active infection locally or systemically at this time which is great news. I do think getting some of the eschar off would be good she actually wants it off she states that it looks bad. She is seen with  the help of an interpreter today. 01-13-2022 upon evaluation patient's wound bed actually showed signs of significant improvement is much smaller looks much better. I do feel like we need to try to clean away some of the necrotic debris today and she is in agreement with that plan. 01-30-2022 upon evaluation today patient appears to be doing well currently in regard to her wound although there are still very small opening currently which I do believe needs to be addressed. Fortunately there does not appear to be any signs of significant infection which is great news in general. No fevers, chills, nausea, vomiting, or diarrhea. She tells me the pain is much better. 11/10; patient comes in and her area on the fourth finger is actually healed. 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 The patient works in a [employer] her hands are not in a wet environment although she does get chemicals and powders on them. Objective Constitutional Patient is hypertensive.. Pulse regular and within target range for patient.Marland Kitchen. Respirations regular, non-labored and within target range.. Temperature is normal and within the target range for the patient.Marland Kitchen. appears in no distress. Vitals Time Taken: 7:43 AM,  Height: 60 in, Weight: 148 lbs, BMI: 28.9, Temperature: 98.1 F, Pulse: 92 bpm, Respiratory Rate: 18 breaths/min, Blood Pressure: 161/97 mmHg. General Notes: Wound exam; fourth finger on the left. Lateral aspect this is completely healed. I looked back through the pictures and I healed. At 1 point this was clearly an expanding area. She did get cultured and treated with antibiotics which seem to have helped Integumentary (Hair, Skin) Wound #1 status is Healed - Epithelialized. Original cause of wound was Gradually Appeared. The date acquired was: 08/04/2021. The wound has been in treatment 16 weeks. The wound is located on the Left,Distal Hand - 3rd Digit. The wound measures 0cm length x 0cm width x 0cm depth; 0cm^2 area and 0cm^3 volume. There is no tunneling or undermining noted. There is a none present amount of drainage noted. The wound margin is flat and intact. There is no granulation within the wound bed. There is no necrotic tissue within the wound bed. Assessment Active Problems ICD-10 Cellulitis of left finger Non-pressure chronic ulcer of skin of other sites with fat layer exposed Type 2 diabetes mellitus with other skin ulcer Yerian, Olivia Ramos ([id number]) [id number] Page 5 of 5 Essential (primary) hypertension Plan [facility] Treatment Complete - Your wound has healed. Please call if any further issues arise 1. The patient could be discharged from the clinic 2. Acute on developing into a chronic paronychia successfully treated. Electronic Signature(s) Signed: 02/13/2022 12:57:19 PM By: Baltazar Najjarobson, Latoshia Monrroy MD Entered By: Baltazar Najjarobson, Rosmarie Esquibel on 02/13/2022 08:02:58 -------------------------------------------------------------------------------- SuperBill Details Patient Name: Date of Service: Olivia GibneyHERNA NDEZ, [place] RIA 02/13/2022 Medical Record Number: [id number] Patient Account Number: [id number] Date of Birth/Sex:  Treating RN: Apr 08, 1955 (66 y.o. Skip MayerF) Woody, Kim Primary Care Provider: Jenell MillinerLuyando, Yvonne Other Clinician: Betha LoaVenable, Angie Referring Provider: Treating Provider/Extender: Chauncey MannO BSO N, MICHA EL Chauncey FischerG Luyando, Yvonne Weeks in Treatment: 16 Diagnosis Coding ICD-10 Codes Code Description L03.012 Cellulitis of left finger L98.492 Non-pressure chronic ulcer of skin of other sites with fat layer exposed E11.622 Type 2 diabetes mellitus with other skin ulcer I10 Essential (primary) hypertension Facility Procedures : CPT4 Code: [id number] Description: [id number] - WOUND CARE VISIT-LEV 2 EST PT Modifier: Quantity: 1 Physician Procedures : CPT4 Code Description Modifier [id number] - WC PHYS LEVEL 2 - EST  PT ICD-10 Diagnosis Description L98.492 Non-pressure chronic ulcer of skin of other sites with fat layer exposed L03.012 Cellulitis of left finger Quantity: 1 Electronic Signature(s) Signed: 02/13/2022 12:57:19 PM By: Baltazar Najjar MD Entered By: Baltazar Najjar on 02/13/2022 08:03:14

## 2022-02-18 NOTE — Progress Notes (Addendum)
[place], Dorota ([id number]) [id number] Page 1 of 8 Visit Report for 02/13/2022 Arrival Information Details Patient Name: Date of Service: Olivia Ramos, [place] RIA 02/13/2022 7:30 A M Medical Record Number: [id number] Patient Account Number: [id number] Date of Birth/Sex: Treating RN: Sep 01, 1955 (66 y.o. Cathlean Cower, Kim Primary Care Maize Brittingham: Jenell Milliner Other Clinician: Betha Loa Referring Shriley Joffe: Treating Amelda Hapke/Extender: Chauncey Mann, MICHA EL Chauncey Fischer in Treatment: 16 Visit Information History Since Last Visit All ordered tests and consults were completed: No Patient Arrived: Ambulatory Added or deleted any medications: No Arrival Time: 07:42 Any new allergies or adverse reactions: No Transfer Assistance: None Had a fall or experienced change in No Patient Requires Transmission-Based Precautions: No activities of daily living that may affect Patient Has Alerts: No risk of falls: Signs or symptoms of abuse/neglect since last visito No Hospitalized since last visit: No Implantable device outside of the clinic excluding No cellular tissue based products placed in the center since last visit: Pain Present Now: No Electronic Signature(s) Signed: 02/17/2022 7:52:55 AM By: Betha Loa Entered By: Betha Loa on 02/13/2022 07:43:00 -------------------------------------------------------------------------------- Clinic Level of Care Assessment Details Patient Name: Date of Service: Olivia Gibney, [place] RIA 02/13/2022 7:30 A M Medical Record Number: [id number] Patient Account Number: [id number] Date of Birth/Sex: Treating RN: [birth year] (66 y.o. Skip Mayer Primary Care Anaaya Fuster: Jenell Milliner Other Clinician: Betha Loa Referring Jrue Yambao: Treating Yoskar Murrillo/Extender: RO BSO N, MICHA EL Chauncey Fischer in Treatment: 16 Clinic Level of Care Assessment Items TOOL 4 Quantity Score []  - 0 Use when only an EandM is  performed on FOLLOW-UP visit ASSESSMENTS - Nursing Assessment / Reassessment X- 1 10 Reassessment of Co-morbidities (includes updates in patient status) X- 1 5 Reassessment of Adherence to Treatment Plan [place], Olivia ([place]) [id number] Page 2 of 8 ASSESSMENTS - Wound and Skin A ssessment / Reassessment X - Simple Wound Assessment / Reassessment - one wound 1 5 []  - 0 Complex Wound Assessment / Reassessment - multiple wounds []  - 0 Dermatologic / Skin Assessment (not related to wound area) ASSESSMENTS - Focused Assessment []  - 0 Circumferential Edema Measurements - multi extremities []  - 0 Nutritional Assessment / Counseling / Intervention []  - 0 Lower Extremity Assessment (monofilament, tuning fork, pulses) []  - 0 Peripheral Arterial Disease Assessment (using hand held doppler) ASSESSMENTS - Ostomy and/or Continence Assessment and Care []  - 0 Incontinence Assessment and Management []  - 0 Ostomy Care Assessment and Management (repouching, etc.) PROCESS - Coordination of Care X - Simple Patient / Family Education for ongoing care 1 15 []  - 0 Complex (extensive) Patient / Family Education for ongoing care []  - 0 Staff obtains 03-15-1997, Records, T Results / Process Orders est []  - 0 Staff telephones HHA, Nursing Homes / Clarify orders / etc []  - 0 Routine Transfer to another Facility (non-emergent condition) []  - 0 Routine Hospital Admission (non-emergent condition) []  - 0 New Admissions / / Ordering NPWT Apligraf, etc. , []  - 0 Emergency Hospital Admission (emergent condition) X- 1 10 Simple Discharge Coordination []  - 0 Complex (extensive) Discharge Coordination PROCESS - Special Needs []  - 0 Pediatric / Minor Patient Management []  - 0 Isolation Patient Management []  - 0 Hearing / Language / Visual special needs []  - 0 Assessment of Community assistance (transportation, D/C planning, etc.) []  -  0 Additional assistance / Altered mentation []  - 0 Support Surface(s) Assessment (bed, cushion, seat, etc.) INTERVENTIONS - Wound Cleansing / Measurement X - Simple Wound Cleansing -  one wound 1 5 []  - 0 Complex Wound Cleansing - multiple wounds X- 1 5 Wound Imaging (photographs - any number of wounds) []  - 0 Wound Tracing (instead of photographs) []  - 0 Simple Wound Measurement - one wound []  - 0 Complex Wound Measurement - multiple wounds INTERVENTIONS - Wound Dressings []  - 0 Small Wound Dressing one or multiple wounds []  - 0 Medium Wound Dressing one or multiple wounds []  - 0 Large Wound Dressing one or multiple wounds []  - 0 Application of Medications - topical []  - 0 Application of Medications - injection INTERVENTIONS - Miscellaneous []  - 0 External ear exam Ramos, Olivia ( ) [id number] Page 3 of 8 []  - 0 Specimen Collection (cultures, biopsies, blood, body fluids, etc.) []  - 0 Specimen(s) / Culture(s) sent or taken to Lab for analysis []  - 0 Patient Transfer (multiple staff / Lift / Similar devices) []  - 0 Simple Staple / Suture removal (25 or less) []  - 0 Complex Staple / Suture removal (26 or more) []  - 0 Hypo / Hyperglycemic Management (close monitor of Blood Glucose) []  - 0 Ankle / Brachial Index (ABI) - do not check if billed separately X- 1 5 Vital Signs Has the patient been seen at the hospital within the last three years: Yes Total Score: 60 Level Of Care: New/Established - Level 2 Electronic Signature(s) Signed: 02/17/2022 7:52:55 AM By: Entered By: on 02/13/2022 07:59:47 -------------------------------------------------------------------------------- Encounter Discharge Information Details Patient Name: Date of Service: , [place] RIA 02/13/2022 7:30 A M Medical Record Number: Patient Account Number: [id number] Date of Birth/Sex: Treating RN: 1956-04-03  (66 y.o. Primary Care Puanani Gene: Other Clinician: Michiel Sites Referring Leyton Magoon: Treating Mitali Shenefield/Extender: RO BSO N, MICHA EL in Treatment: 16 Encounter Discharge Information Items Discharge Condition: Stable Ambulatory Status: Ambulatory Discharge Destination: Home Transportation: Private Auto Schedule Follow-up Appointment: Yes Clinical Summary of Care: Electronic Signature(s) Signed: 02/17/2022 7:52:55 AM By: Entered By: on 02/13/2022 08:00:50 -------------------------------------------------------------------------------- Lower Extremity Assessment Details Patient Name: Date of Service: Betha Loa, MA RIA 02/13/2022 7:30 A M Medical Record Number: 13/01/2022 Patient Account Number: Olivia Ramos YANELIE, ABRAHA ([id number]) [id number] Page 4 of 8 Date of Birth/Sex: Treating RN: 10/17/1955 (66 y.o. Skip Mayer Primary Care Terricka Onofrio: Other Clinician: Jenell Milliner Referring Jessa Stinson: Treating Jaquavis Felmlee/Extender: RO BSO Betha Loa, MICHA EL Chauncey Fischer in Treatment: 16 Electronic Signature(s) Signed: 02/17/2022 7:52:55 AM By: Betha Loa Signed: 02/17/2022 5:24:20 PM By: 13/01/2022, BSN, RN, CWS, Kim RN, BSN Entered By: Olivia Ramos on 02/13/2022 07:55:08 -------------------------------------------------------------------------------- Multi Wound Chart Details Patient Name: Date of Service: [id number], [place] RIA 02/13/2022 7:30 A M Medical Record Number: Olivia Ramos Patient Account Number: [id number] Date of Birth/Sex: Treating RN: Dec 18, 1955 (66 y.o. 71 Primary Care Aiyannah Fayad: Skip Mayer Other Clinician: Guillermina City Referring Krishang Reading: Treating Wilver Tignor/Extender: RO BSO N, MICHA EL Dorris Carnes in Treatment: 16 Vital Signs Height(in): 60 Pulse(bpm): 92 Weight(lbs): 148 Blood Pressure(mmHg): 161/97 Body Mass  Index(BMI): 28.9 Temperature(F): 98.1 Respiratory Rate(breaths/min): 18 [1:Photos:] [N/A:N/A] Left, Distal Hand - 3rd Digit N/A N/A Wound Location: Gradually Appeared N/A N/A Wounding Event: Infection - not elsewhere classified N/A N/A Primary Etiology: Asthma, Hypertension, Type II N/A N/A Comorbid History: Diabetes 08/04/2021 N/A N/A Date Acquired: 16 N/A N/A Weeks of Treatment: Healed - Epithelialized N/A N/A Wound Status: No N/A N/A Wound Recurrence: 0x0x0 N/A N/A Measurements L x W x D (  cm) 0 N/A N/A A (cm) : rea 0 N/A N/A Volume (cm) : 100.00% N/A N/A % Reduction in Area: 100.00% N/A N/A % Reduction in Volume: Full Thickness Without Exposed N/A N/A Classification: Support Structures None Present N/A N/A Exudate Amount: Flat and Intact N/A N/A Wound Margin: None Present (0%) N/A N/A Granulation Amount: None Present (0%) N/A N/A Necrotic Amount: Fascia: No N/A N/A Exposed Structures: Fat Layer (Subcutaneous Tissue): No Tendon: No Muscle: No Joint: No Bone: No Ramos, Olivia ([id number]) [id number] Page 5 of 8 Large (67-100%) N/A N/A Epithelialization: Treatment Notes Electronic Signature(s) Signed: 02/17/2022 7:52:55 AM By: Betha Loa Entered By: Betha Loa on 02/13/2022 07:55:19 -------------------------------------------------------------------------------- Multi-Disciplinary Care Plan Details Patient Name: Date of Service: Olivia Gibney, [place] RIA 02/13/2022 7:30 A M Medical Record Number: [id number] Patient Account Number: [id number] Date of Birth/Sex: Treating RN: 1955-11-05 (66 y.o. Skip Mayer Primary Care Whitnie Deleon: Jenell Milliner Other Clinician: Betha Loa Referring Scotti Kosta: Treating Kamalei Roeder/Extender: Chauncey Mann, MICHA EL Chauncey Fischer in Treatment: 16 Multidisciplinary Care Plan reviewed with physician Active Inactive Electronic Signature(s) Signed: 03/04/2022 2:50:53 PM By: Elliot Gurney  BSN, RN, CWS, Kim RN, BSN Previous Signature: 02/17/2022 7:52:55 AM Version By: Betha Loa Previous Signature: 02/17/2022 5:24:20 PM Version By: Elliot Gurney, BSN, RN, CWS, Kim RN, BSN Entered By: Elliot Gurney, BSN, RN, CWS, Kim on 03/04/2022 14:50:53 -------------------------------------------------------------------------------- Pain Assessment Details Patient Name: Date of Service: Olivia Gibney, [place] RIA 02/13/2022 7:30 A M Medical Record Number: [id number] Patient Account Number: [id number] Date of Birth/Sex: Treating RN: May 25, 1955 (65 y.o. Skip Mayer Primary Care Racquel Arkin: Jenell Milliner Other Clinician: Betha Loa Referring Daymon Hora: Treating Jamyia Fortune/Extender: RO BSO N, MICHA EL Chauncey Fischer in Treatment: 16 Active Problems Location of Pain Severity and Description of Pain Patient Has Paino No Site Locations [place], [place] ([id number]) [id number] Page 6 of 8 Pain Management and Medication Current Pain Management: Electronic Signature(s) Signed: 02/17/2022 7:52:55 AM By: Betha Loa Signed: 02/17/2022 5:24:20 PM By: Elliot Gurney, BSN, RN, CWS, Kim RN, BSN Entered By: Betha Loa on 02/13/2022 07:45:25 -------------------------------------------------------------------------------- Patient/Caregiver Education Details Patient Name: Date of Service: Olivia Gibney, [place] RIA 11/10/2023andnbsp7:30 A M Medical Record Number: [id number] Patient Account Number: [id number] Date of Birth/Gender: Treating RN: May 15, 1955 (66 y.o. Skip Mayer Primary Care Physician: Jenell Milliner Other Clinician: Betha Loa Referring Physician: Treating Physician/Extender: RO BSO N, MICHA EL Chauncey Fischer in Treatment: 16 Education Assessment Education Provided To: Patient Education Topics Provided Wound/Skin Impairment: Handouts: Other: Your wound has healed. Please call if any further issues arise Methods: Explain/Verbal Responses: State content  correctly Electronic Signature(s) Signed: 02/17/2022 7:52:55 AM By: Betha Loa Entered By: Betha Loa on 02/13/2022 08:00:23 Ramos, Olivia ([id number]) [id number] Page 7 of 8 -------------------------------------------------------------------------------- Wound Assessment Details Patient Name: Date of Service: Olivia Gibney, [place] RIA 02/13/2022 7:30 A M Medical Record Number: [id number] Patient Account Number: [id number] Date of Birth/Sex: Treating RN: [birth year] (66 y.o. Cathlean Cower, Kim Primary Care Decklin Weddington: Jenell Milliner Other Clinician: Betha Loa Referring Tula Schryver: Treating Aiyanah Kalama/Extender: RO BSO N, MICHA EL Chauncey Fischer in Treatment: 16 Wound Status Wound Number: 1 Primary Etiology: Infection - not elsewhere classified Wound Location: Left, Distal Hand - 3rd Digit Wound Status: Healed - Epithelialized Wounding Event: Gradually Appeared Comorbid History: Asthma, Hypertension, Type II Diabetes Date Acquired: 08/04/2021 Weeks Of Treatment: 16 Clustered Wound: No Photos Wound Measurements Length: (cm) Width: (cm) Depth: (cm) Area: (cm) Volume: (cm) 0 % Reduction in Area: 100% 0 % Reduction in Volume: 100% 0  Epithelialization: Large (67-100%) 0 Tunneling: No 0 Undermining: No Wound Description Classification: Full Thickness Without Exposed Support Wound Margin: Flat and Intact Exudate Amount: None Present Structures Foul Odor After Cleansing: No Slough/Fibrino No Wound Bed Granulation Amount: None Present (0%) Exposed Structure Necrotic Amount: None Present (0%) Fascia Exposed: No Fat Layer (Subcutaneous Tissue) Exposed: No Tendon Exposed: No Muscle Exposed: No Joint Exposed: No Bone Exposed: No Treatment Notes Wound #1 (Hand - 3rd Digit) Wound Laterality: Left, Distal Cleanser Peri-Wound Care Topical Primary Dressing Ramos, Olivia ([id number]) [id number] Page 8 of 8 Secondary  Dressing Secured With Compression Wrap Compression Stockings Add-Ons Electronic Signature(s) Signed: 02/17/2022 7:52:55 AM By: Betha LoaVenable, Olivia Signed: 02/17/2022 5:24:20 PM By: Elliot GurneyWoody, BSN, RN, CWS, Kim RN, BSN Entered By: Betha LoaVenable, Olivia on 02/13/2022 07:55:03 -------------------------------------------------------------------------------- Vitals Details Patient Name: Date of Service: Olivia GibneyHERNA NDEZ, [place] RIA 02/13/2022 7:30 A M Medical Record Number: [id number] Patient Account Number: [id number] Date of Birth/Sex: Treating RN: 07-04-1955 (66 y.o. Cathlean CowerF) Woody, Kim Primary Care Era Parr: Jenell MillinerLuyando, Yvonne Other Clinician: Betha LoaVenable, Olivia Referring Winslow Ederer: Treating Reily Ilic/Extender: RO BSO N, MICHA EL Chauncey FischerG Luyando, Yvonne Weeks in Treatment: 16 Vital Signs Time Taken: 07:43 Temperature (F): 98.1 Height (in): 60 Pulse (bpm): 92 Weight (lbs): 148 Respiratory Rate (breaths/min): 18 Body Mass Index (BMI): 28.9 Blood Pressure (mmHg): 161/97 Reference Range: 80 - 120 mg / dl Electronic Signature(s) Signed: 02/17/2022 7:52:55 AM By: Betha LoaVenable, Olivia Entered By: Betha LoaVenable, Olivia on 02/13/2022 07:45:18

## 2022-03-01 IMAGING — CR DG CHEST 2V
2 series · 2 of 2 positions shown · non-contrast
Comparison: Chest x-ray dated January 21, 2018.

CLINICAL DATA: Cough and shortness of breath.

EXAM:
CHEST - 2 VIEW

[chest pa]
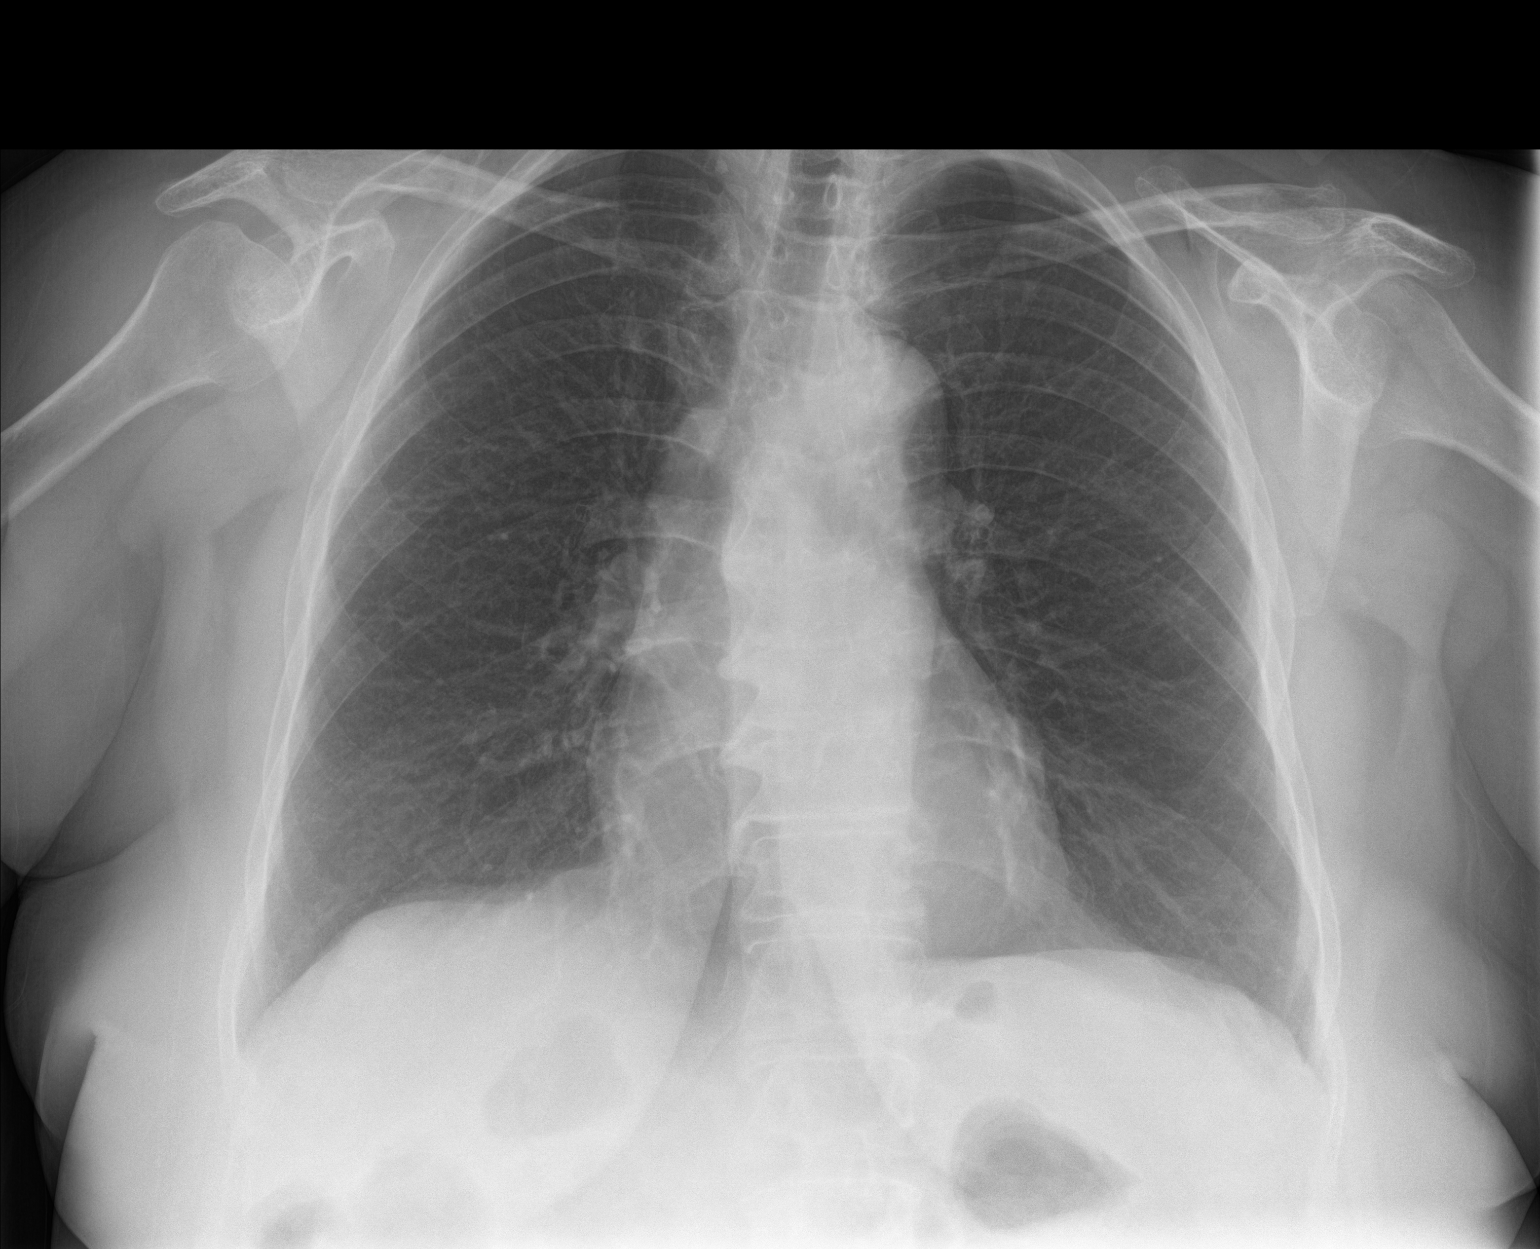

[chest lat]
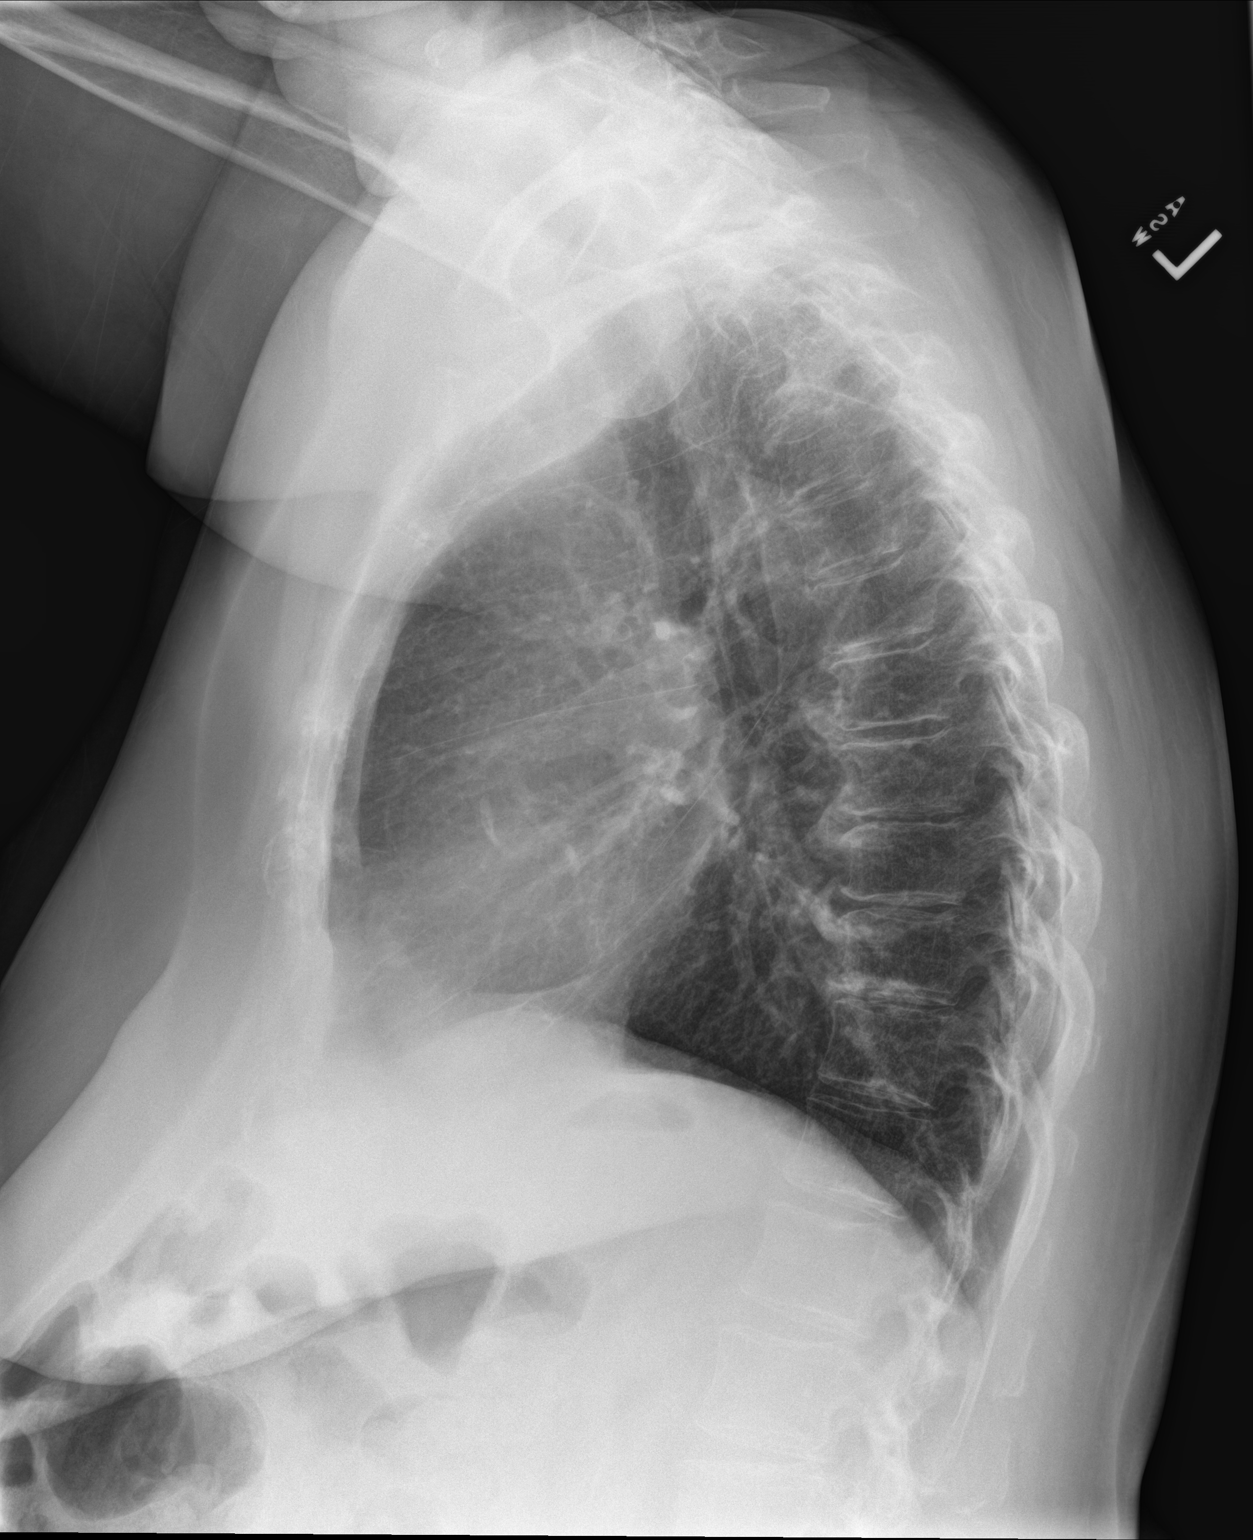

[2 of 2 positions shown; findings below may reference images not displayed]

FINDINGS: The heart size and mediastinal contours are within normal limits.
Both lungs are clear. The visualized skeletal structures are
unremarkable.
IMPRESSION: No active cardiopulmonary disease.

## 2022-10-24 ENCOUNTER — Ambulatory Visit
Admission: EM | Admit: 2022-10-24 | Discharge: 2022-10-24 | Disposition: A | Discharge status: Home / Self Care | Payer: BC Managed Care – PPO | Attending: Physician Assistant | Admitting: Physician Assistant

## 2022-10-24 DIAGNOSIS — R051 Acute cough: Secondary | ICD-10-CM | POA: Insufficient documentation

## 2022-10-24 DIAGNOSIS — J45901 Unspecified asthma with (acute) exacerbation: Secondary | ICD-10-CM | POA: Insufficient documentation

## 2022-10-24 DIAGNOSIS — J4521 Mild intermittent asthma with (acute) exacerbation: Secondary | ICD-10-CM | POA: Insufficient documentation

## 2022-10-24 DIAGNOSIS — U071 COVID-19: Secondary | ICD-10-CM | POA: Insufficient documentation

## 2022-10-24 LAB — SARS CORONAVIRUS 2 BY RT PCR: SARS Coronavirus 2 by RT PCR: POSITIVE — AB

## 2022-10-24 MED ORDER — NIRMATRELVIR/RITONAVIR (PAXLOVID)TABLET: 3.0000 | ORAL_TABLET | Freq: Two times a day (BID) | ORAL | 0 refills | Status: AC

## 2022-10-24 MED ORDER — ALBUTEROL SULFATE HFA 108 (90 BASE) MCG/ACT IN AERS: 1.0000 | INHALATION_SPRAY | Freq: Four times a day (QID) | RESPIRATORY_TRACT | 1 refills | Status: DC | PRN

## 2022-10-24 MED ORDER — BENZONATATE 200 MG PO CAPS: 200.0000 mg | ORAL_CAPSULE | Freq: Three times a day (TID) | ORAL | 0 refills | Status: AC | PRN

## 2022-10-24 MED ORDER — ALBUTEROL SULFATE (2.5 MG/3ML) 0.083% IN NEBU
2.5000 mg | INHALATION_SOLUTION | Freq: Once | RESPIRATORY_TRACT | Status: AC
  Administered 2022-10-24: 2.5 mg via RESPIRATORY_TRACT

## 2022-10-24 NOTE — ED Triage Notes (Signed)
Used interpreter Irving Shows [phone]  Pt is with her son.  Pt c/o cough, sore throat, and SOB x3days  Pt has asthma and is worried about an asthma flare up  Pt states that she can not breathe well and if she takes a deep breathe she begins to cough.   Pt does not have an inhaler.

## 2022-10-24 NOTE — Discharge Instructions (Addendum)
-  Tienes COVID 19. Aislar hasta que no tenga fiebre 24 horas y [employer] sntomas. -Envi a Paxlovid. No tome su estatina para el colesterol (ATORVASTATIN) durante los 5 [address] toma Paxlovid y los 5 [address]  -Rellen el inhalador de albuterol y envi medicamento para la tos. -Vaya a urgencias si su respiracin empeora.  -You have COVID 19. Isolate until fever free 24 hours and improving symptoms. -I sent Paxlovid. Do not take your statin cholesterol medicine for the 5 days you take Paxlovid and the 5 days after.  -I refilled albuterol inhaler and sent cough medicine. -Go to ER if your breathing worsens.

## 2022-10-24 NOTE — ED Provider Notes (Signed)
[facility]    CSN: [id number] Arrival date & time: 10/24/22  1343      History   Chief Complaint Chief Complaint  Patient presents with   Sore Throat   Shortness of Breath   Cough    HPI Olivia Ramos is a 67 y.o. female with history of asthma, diabetes and hypertension.  She presents today for 3-day history of fatigue, cough, congestion, sore throat and shortness of breath.  Patient does not have an inhaler or nebulizer.  She denies any fevers, diarrhea, chest pain.  No sick contacts to her knowledge.  No OTC meds taken.  Language interpreter service used to communicate with patient.  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      Home Medications    Prior to Admission medications   Medication Sig Start Date End Date Taking? Authorizing Provider  acetaminophen (TYLENOL) 325 MG tablet Take by mouth.   Yes [provider]  albuterol (VENTOLIN HFA) 108 (90 Base) MCG/ACT inhaler Inhale 2 puffs into the lungs every 4 (four) hours as needed for wheezing or shortness of breath. 11/14/19   Yes Lamptey, Britta Mccreedy, MD  albuterol (VENTOLIN HFA) 108 (90 Base) MCG/ACT inhaler Inhale 1-2 puffs into the lungs every 6 (six) hours as needed for wheezing or shortness of breath. 10/24/22  Yes Eusebio Friendly B, PA-C  atorvastatin (LIPITOR) 80 MG tablet Take 1 tablet (80 mg total) by mouth daily. 04/04/18  Yes Duanne Limerick, MD  benzonatate (TESSALON) 200 MG capsule Take 1 capsule (200 mg total) by mouth 3 (three) times daily as needed for cough. 10/24/22  Yes Eusebio Friendly B, PA-C  Dulaglutide (TRULICITY) 1.5 MG/0.5ML SOPN Inject into the skin. 09/16/21  Yes [provider]  empagliflozin (JARDIANCE) 10 MG TABS tablet Take 1 tablet by mouth daily. 09/12/21  Yes [provider]  famotidine-calcium carbonate-magnesium hydroxide (PEPCID COMPLETE) 10-800-165 MG chewable tablet Chew 1 tablet by mouth daily as needed.   Yes [provider]  Fluticasone-Salmeterol (ADVAIR) 250-50 MCG/DOSE AEPB Inhale 1 puff into the lungs 2 (two) times daily. 04/04/18  Yes Duanne Limerick, MD  glucose monitoring kit (FREESTYLE) monitoring kit 1 each by Does not apply route daily. Check glucose once in the morning before breakfast and 1 hour after a meal 04/04/18  Yes Duanne Limerick, MD  Insulin Glargine (BASAGLAR KWIKPEN) 100 UNIT/ML SOPN INJECT 0.35 MLS (35 UNITS TOTAL) INTO THE SKIN DAILY. 09/20/18  Yes Duanne Limerick, MD  Insulin Pen Needle (PEN NEEDLES) 31G X 8 MM MISC Use as directed 04/04/18  Yes Duanne Limerick, MD  Lancets (FREESTYLE) lancets Use as instructed 04/04/18  Yes Duanne Limerick, MD  losartan (COZAAR) 50 MG tablet Take 1 tablet (50 mg total) by mouth daily. 04/04/18  Yes Duanne Limerick, MD  losartan (COZAAR) 50 MG tablet Take 1 tablet by mouth daily. 08/29/21  Yes [provider]  metFORMIN (GLUCOPHAGE) 1000 MG tablet TAKE 1 TABLET (1,000 MG TOTAL) BY MOUTH 2 (TWO) TIMES DAILY WITH A MEAL. 10/03/18  Yes Duanne Limerick, MD  nirmatrelvir/ritonavir (PAXLOVID) 20 x 150 MG &  10 x 100MG  TABS Take 3 tablets by mouth 2 (two) times daily for 5 days. Patient GFR is 75. Take nirmatrelvir (150 mg) two tablets twice daily for 5 days and ritonavir (100 mg) one tablet twice daily for 5 days. 10/24/22 10/29/22 Yes Shirlee Latch, PA-C  traMADol (ULTRAM) 50 MG tablet Take 1 tablet (50 mg total) by mouth every 6 (six) hours as needed. 11/15/21  Yes Brimage, Vondra, DO  atorvastatin (LIPITOR) 80 MG tablet Take by mouth. 05/22/21 05/22/22  [provider]  glucose blood (FREESTYLE LITE) test strip Use as instructed 04/04/18   Duanne Limerick, MD  meloxicam (MOBIC) 15 MG tablet Take 15 mg by mouth daily. 10/24/21   [provider]  metFORMIN (GLUCOPHAGE) 1000 MG tablet Take by mouth. 06/17/21 06/18/22  [provider]  mupirocin ointment (BACTROBAN) 2 % Apply topically daily. 10/24/21   [provider]    Family History Family History  Problem Relation Age of Onset   Heart attack Mother    Diabetes Mother    Hypertension Father    Cancer Sister        breast   Heart attack Brother    Stroke Sister    Breast cancer Neg Hx     Social History Social History   Tobacco Use   Smoking status: Never   Smokeless tobacco: Never  Vaping Use   Vaping status: Never Used  Substance Use Topics   Alcohol use: No    Alcohol/week: 0.0 standard drinks of alcohol   Drug use: No     Allergies   Soy allergy   Review of Systems Review of Systems  Constitutional:  Positive for fatigue. Negative for chills, diaphoresis and fever.  HENT:  Positive for congestion, rhinorrhea and sore throat. Negative for ear pain, sinus pressure and sinus pain.   Respiratory:  Positive for cough and shortness of breath.   Cardiovascular:  Negative for chest pain.  Gastrointestinal:  Negative for abdominal pain, nausea and vomiting.  Musculoskeletal:  Negative for arthralgias and myalgias.  Skin:  Negative for rash.  Neurological:  Negative for weakness and headaches.   Hematological:  Negative for adenopathy.     Physical Exam Triage Vital Signs ED Triage Vitals  Encounter Vitals Group     BP 10/24/22 1504 (!) 190/102     Systolic BP Percentile --      Diastolic BP Percentile --      Pulse Rate 10/24/22 1504 93     Resp --      Temp 10/24/22 1504 98.4 F (36.9 C)     Temp Source 10/24/22 1504 Oral     SpO2 10/24/22 1504 100 %     Weight 10/24/22 1500 147 lb (66.7 kg)     Height 10/24/22 1500 5\' 1"  (1.549 m)     Head Circumference --      Peak Flow --      Pain  Score 10/24/22 1500 0     Pain Loc --      Pain Education --      Exclude from Growth Chart --    No data found.  Updated Vital Signs BP 125/70 (BP Location: Left Arm)   Pulse 93   Temp 98.4 F (36.9 C) (Oral)   Ht 5\' 1"  (1.549 m)   Wt 147 lb (66.7 kg)   SpO2 100%   BMI 27.78 kg/m     Physical Exam Vitals and nursing note reviewed.  Constitutional:      General: She is not in acute distress.    Appearance: Normal appearance. She is not ill-appearing or toxic-appearing.  HENT:     Head: Normocephalic and atraumatic.     Nose: Congestion present.     Mouth/Throat:     Mouth: Mucous membranes are moist.     Pharynx: Oropharynx is clear. Posterior oropharyngeal erythema present.  Eyes:     General: No scleral icterus.       Right eye: No discharge.        Left eye: No discharge.     Conjunctiva/sclera: Conjunctivae normal.  Cardiovascular:     Rate and Rhythm: Normal rate and regular rhythm.     Heart sounds: Normal heart sounds.  Pulmonary:     Effort: Pulmonary effort is normal. No respiratory distress.     Breath sounds: Wheezing (scattered wheezes) present.  Musculoskeletal:     Cervical back: Neck supple.  Skin:    General: Skin is dry.  Neurological:     General: No focal deficit present.     Mental Status: She is alert. Mental status is at baseline.     Motor: No weakness.     Gait: Gait normal.  Psychiatric:        Mood and Affect: Mood normal.         Behavior: Behavior normal.        Thought Content: Thought content normal.      UC Treatments / Results  Labs (all labs ordered are listed, but only abnormal results are displayed) Labs Reviewed  SARS CORONAVIRUS 2 BY RT PCR - Abnormal; Notable for the following components:      Result Value   SARS Coronavirus 2 by RT PCR POSITIVE (*)    All other components within normal limits    EKG   Radiology No results found.  Procedures Procedures (including critical care time)  Medications Ordered in UC Medications  albuterol (PROVENTIL) (2.5 MG/3ML) 0.083% nebulizer solution 2.5 mg (2.5 mg Nebulization Given 10/24/22 1531)    Initial Impression / Assessment and Plan / UC Course  I have reviewed the triage vital signs and the nursing notes.  Pertinent labs & imaging results that were available during my care of the patient were reviewed by me and considered in my medical decision making (see chart for details).   67 year old female history of hypertension, diabetes and asthma presents for 3-day history of cough, congestion, shortness of breath/wheezing, fatigue and sore throat.  No sick contacts.  Initial BP is 190/102 but on recheck is 125/70.  Other vitals normal and stable.  Oxygen is 100%.  No distress.  She is overall well-appearing.  On exam she does have nasal congestion, erythema posterior pharynx and scattered wheezes throughout.  Patient given albuterol nebulizer for wheezing and shortness of breath due to asthma exacerbation.  COVID test is positive.  Reviewed results with patient.  Desires Paxlovid therapy.  Reviewed GFR  was 75 four months ago.  Sent Paxlovid and advised her to hold the statin x 10 days.  Also sent albuterol inhaler and benzonatate.  Reviewed current CDC guidelines, isolation protocol and ED precautions.  Work note given.   Final Clinical Impressions(s) / UC Diagnoses   Final diagnoses:  COVID-19  Asthma with acute exacerbation, unspecified  asthma severity, unspecified whether persistent  Acute cough     Discharge Instructions      -Tienes COVID 19. Aislar hasta que no tenga fiebre 24 horas y [employer] sntomas. -Envi a Paxlovid. No tome su estatina para el colesterol (ATORVASTATIN) durante los 5 [address] toma Paxlovid y los 5 [address]  -Rellen el inhalador de albuterol y envi medicamento para la tos. -Vaya a urgencias si su respiracin empeora.  -You have COVID 19. Isolate until fever free 24 hours and improving symptoms. -I sent Paxlovid. Do not take your statin cholesterol medicine for the 5 days you take Paxlovid and the 5 days after.  -I refilled albuterol inhaler and sent cough medicine. -Go to ER if your breathing worsens.     ED Prescriptions     Medication Sig Dispense Auth. Provider   albuterol (VENTOLIN HFA) 108 (90 Base) MCG/ACT inhaler Inhale 1-2 puffs into the lungs every 6 (six) hours as needed for wheezing or shortness of breath. 1 g Shirlee Latch, PA-C   nirmatrelvir/ritonavir (PAXLOVID) 20 x 150 MG & 10 x 100MG  TABS Take 3 tablets by mouth 2 (two) times daily for 5 days. Patient GFR is 75. Take nirmatrelvir (150 mg) two tablets twice daily for 5 days and ritonavir (100 mg) one tablet twice daily for 5 days. 30 tablet Eusebio Friendly B, PA-C   benzonatate (TESSALON) 200 MG capsule Take 1 capsule (200 mg total) by mouth 3 (three) times daily as needed for cough. 30 capsule Shirlee Latch, PA-C      PDMP not reviewed this encounter.   Shirlee Latch, PA-C 10/25/22 [phone]

## 2023-04-29 ENCOUNTER — Ambulatory Visit (INDEPENDENT_AMBULATORY_CARE_PROVIDER_SITE_OTHER): Payer: BC Managed Care – PPO

## 2023-04-29 ENCOUNTER — Encounter: Payer: Self-pay | Admitting: Emergency Medicine

## 2023-04-29 ENCOUNTER — Ambulatory Visit
Admission: EM | Admit: 2023-04-29 | Discharge: 2023-04-29 | Disposition: A | Discharge status: Home / Self Care | Payer: BC Managed Care – PPO | Attending: Physician Assistant | Admitting: Physician Assistant

## 2023-04-29 DIAGNOSIS — R051 Acute cough: Secondary | ICD-10-CM

## 2023-04-29 DIAGNOSIS — R0602 Shortness of breath: Secondary | ICD-10-CM | POA: Diagnosis not present

## 2023-04-29 DIAGNOSIS — J209 Acute bronchitis, unspecified: Secondary | ICD-10-CM

## 2023-04-29 DIAGNOSIS — J45901 Unspecified asthma with (acute) exacerbation: Secondary | ICD-10-CM

## 2023-04-29 MED ORDER — ALBUTEROL SULFATE HFA 108 (90 BASE) MCG/ACT IN AERS: 1.0000 | INHALATION_SPRAY | Freq: Four times a day (QID) | RESPIRATORY_TRACT | 1 refills | Status: AC | PRN

## 2023-04-29 MED ORDER — ALBUTEROL SULFATE (2.5 MG/3ML) 0.083% IN NEBU: 2.5000 mg | INHALATION_SOLUTION | Freq: Four times a day (QID) | RESPIRATORY_TRACT | 0 refills | Status: AC | PRN

## 2023-04-29 MED ORDER — DEXAMETHASONE SODIUM PHOSPHATE 10 MG/ML IJ SOLN
10.0000 mg | Freq: Once | INTRAMUSCULAR | Status: AC
Start: 2023-04-29 — End: 2023-04-29
  Administered 2023-04-29: 10 mg via INTRAMUSCULAR

## 2023-04-29 MED ORDER — DOXYCYCLINE HYCLATE 100 MG PO CAPS: 100.0000 mg | ORAL_CAPSULE | Freq: Two times a day (BID) | ORAL | 0 refills | Status: AC

## 2023-04-29 MED ORDER — IPRATROPIUM-ALBUTEROL 0.5-2.5 (3) MG/3ML IN SOLN
3.0000 mL | Freq: Once | RESPIRATORY_TRACT | Status: AC
  Administered 2023-04-29: 3 mL via RESPIRATORY_TRACT

## 2023-04-29 MED ORDER — PROMETHAZINE-DM 6.25-15 MG/5ML PO SYRP: 5.0000 mL | ORAL_SOLUTION | Freq: Four times a day (QID) | ORAL | 0 refills | Status: DC | PRN

## 2023-04-29 MED ORDER — PREDNISONE 10 MG PO TABS: ORAL_TABLET | ORAL | 0 refills | Status: DC

## 2023-04-29 NOTE — ED Triage Notes (Signed)
Pt presents with a cough and SOB x 2 weeks. Pt reports pain her chest when she takes a deep breath in.

## 2023-04-29 NOTE — ED Provider Notes (Signed)
[facility]    CSN: [id number] Arrival date & time: 04/29/23  1753      History   Chief Complaint Chief Complaint  Patient presents with   Shortness of Breath   Cough    HPI Binta Toellner is a 68 y.o. female with history of asthma, diabetes and hypertension.  She presents today for 2 week history of fatigue, cough, congestion, chest tightness, wheezing and shortness of breath.  Patient has been using albuterol neb without relief. She denies any fevers, diarrhea, or weakness. No sick contacts to her knowledge.  No OTC meds taken.  Of note, patient was seen in [place] the last week of November 2024.  Diagnosed with pneumonia and given 5-day course of azithromycin +10-day course of Augmentin, Medrol Dosepak, albuterol inhaler and Advair as well as Promethazine DM for cough.  Followed up with her primary care provider at the beginning of December.  Had a CT of chest performed on 04/01/2023.  No evidence of pneumonia on CT scan.  Patient states she felt well for about 3 weeks before symptoms returned after she was initially treated.  Patient declines interpreter. She has a family member present helping to translate.  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      Home Medications    Prior to Admission medications   Medication Sig Start Date End Date Taking? Authorizing Provider  albuterol (PROVENTIL) (2.5 MG/3ML) 0.083% nebulizer solution Take 3 mLs (2.5 mg total) by nebulization every 6 (six) hours as needed. 04/29/23  Yes Shirlee Latch, PA-C  albuterol (VENTOLIN HFA) 108 (90 Base) MCG/ACT inhaler Inhale 1-2 puffs into the lungs every 6 (six) hours as needed for wheezing or shortness of breath. 04/29/23  Yes Eusebio Friendly B, PA-C  doxycycline (VIBRAMYCIN) 100 MG capsule Take 1 capsule (100 mg total) by mouth 2 (two) times daily for 7 days. 04/29/23 05/06/23 Yes Shirlee Latch, PA-C  predniSONE (DELTASONE) 10 MG tablet Take 6 tablets on day 1 and decrease by 1 tablet daily until complete 04/29/23  Yes Shirlee Latch, PA-C  promethazine-dextromethorphan (PROMETHAZINE-DM) 6.25-15 MG/5ML syrup Take 5 mLs by mouth 4 (four) times daily as needed. 04/29/23  Yes Shirlee Latch, PA-C  acetaminophen (TYLENOL) 325 MG tablet Take by mouth.    [provider]  atorvastatin (LIPITOR) 80 MG tablet Take 1 tablet (80 mg total) by mouth daily. 04/04/18   Duanne Limerick, MD  atorvastatin (LIPITOR) 80 MG tablet Take by mouth. 05/22/21 05/22/22  [provider]  benzonatate (TESSALON) 200 MG capsule Take 1 capsule (200  mg total) by mouth 3 (three) times daily as needed for cough. 10/24/22   Shirlee Latch, PA-C  Dulaglutide (TRULICITY) 1.5 MG/0.5ML SOPN Inject into the skin. 09/16/21   [provider]  empagliflozin (JARDIANCE) 10 MG TABS tablet Take 1 tablet by mouth daily. 09/12/21   [provider]  famotidine-calcium carbonate-magnesium hydroxide (PEPCID COMPLETE) 10-800-165 MG chewable tablet Chew 1 tablet by mouth daily as needed.    [provider]   Fluticasone-Salmeterol (ADVAIR) 250-50 MCG/DOSE AEPB Inhale 1 puff into the lungs 2 (two) times daily. 04/04/18   Duanne Limerick, MD  glucose blood (FREESTYLE LITE) test strip Use as instructed 04/04/18   Duanne Limerick, MD  glucose monitoring kit (FREESTYLE) monitoring kit 1 each by Does not apply route daily. Check glucose once in the morning before breakfast and 1 hour after a meal 04/04/18   Duanne Limerick, MD  Insulin Glargine (BASAGLAR KWIKPEN) 100 UNIT/ML SOPN INJECT 0.35 MLS (35 UNITS TOTAL) INTO THE SKIN DAILY. 09/20/18   Duanne Limerick, MD  Insulin Pen Needle (PEN NEEDLES) 31G X 8 MM MISC Use as directed 04/04/18   Duanne Limerick, MD  Lancets (FREESTYLE) lancets Use as instructed 04/04/18   Duanne Limerick, MD  losartan (COZAAR) 50 MG tablet Take 1 tablet (50 mg total) by mouth daily. 04/04/18   Duanne Limerick, MD  losartan (COZAAR) 50 MG tablet Take 1 tablet by mouth daily. 08/29/21   [provider]  meloxicam (MOBIC) 15 MG tablet Take 15 mg by mouth daily. 10/24/21   [provider]  metFORMIN (GLUCOPHAGE) 1000 MG tablet TAKE 1 TABLET (1,000 MG TOTAL) BY MOUTH 2 (TWO) TIMES DAILY WITH A MEAL. 10/03/18   Duanne Limerick, MD  metFORMIN (GLUCOPHAGE) 1000 MG tablet Take by mouth. 06/17/21 06/18/22  [provider]  mupirocin ointment (BACTROBAN) 2 % Apply topically daily. 10/24/21   [provider]  traMADol (ULTRAM) 50 MG tablet Take 1 tablet (50 mg total) by mouth every 6 (six) hours as needed. 11/15/21   Katha Cabal, DO    Family History Family History  Problem Relation Age of Onset   Heart attack Mother    Diabetes Mother    Hypertension Father    Cancer Sister        breast   Heart attack Brother    Stroke Sister    Breast cancer Neg Hx     Social History Social History   Tobacco Use   Smoking status: Never   Smokeless tobacco: Never  Vaping Use   Vaping status: Never Used  Substance Use Topics   Alcohol use: No     Alcohol/week: 0.0 standard drinks of alcohol   Drug use: No     Allergies   Soy allergy (do not select) and Lorazepam   Review of Systems Review of Systems  Constitutional:  Positive for fatigue. Negative for chills, diaphoresis and fever.  HENT:  Positive for congestion. Negative for ear pain, rhinorrhea, sinus pressure, sinus pain and sore throat.   Respiratory:  Positive for cough, chest tightness, shortness of breath and wheezing.   Cardiovascular:  Negative for chest pain.  Gastrointestinal:  Negative for abdominal pain, nausea and vomiting.  Musculoskeletal:  Negative for arthralgias and myalgias.  Skin:  Negative for rash.  Neurological:  Negative for dizziness, weakness and headaches.  Hematological:  Negative for adenopathy.     Physical Exam Triage Vital Signs   Updated Vital Signs BP 128/76 (BP  Location: Left Arm)   Pulse 99   Temp 98.4 F (36.9 C) (Oral)   Resp 18   SpO2 97%     Physical Exam Vitals and nursing note reviewed.  Constitutional:      General: She is not in acute distress.    Appearance: Normal appearance. She is not ill-appearing or toxic-appearing.  HENT:     Head: Normocephalic and atraumatic.     Nose: Congestion present.     Mouth/Throat:     Mouth: Mucous membranes are moist.     Pharynx: Oropharynx is clear. No posterior oropharyngeal erythema.  Eyes:     General: No scleral icterus.       Right eye: No discharge.        Left eye: No discharge.     Conjunctiva/sclera: Conjunctivae normal.  Cardiovascular:     Rate and Rhythm: Normal rate and regular rhythm.     Heart sounds: Normal heart sounds.  Pulmonary:     Effort: Pulmonary effort is normal. No respiratory distress.     Breath sounds: Wheezing (scattered wheezes) present.  Musculoskeletal:     Cervical back: Neck supple.  Skin:    General: Skin is dry.  Neurological:     General: No focal deficit present.     Mental Status: She is alert. Mental status is at baseline.      Motor: No weakness.     Gait: Gait normal.  Psychiatric:        Mood and Affect: Mood normal.        Behavior: Behavior normal.      UC Treatments / Results  Labs (all labs ordered are listed, but only abnormal results are displayed) Labs Reviewed - No data to display   EKG   Radiology DG Chest 2 View Result Date: 04/29/2023 CLINICAL DATA:  Cough and wheezing EXAM: CHEST - 2 VIEW COMPARISON:  11/14/2019 FINDINGS: The heart size and mediastinal contours are within normal limits. Both lungs are clear. The visualized skeletal structures are unremarkable. Minimal atelectasis or scarring at the lingula and left base. IMPRESSION: No active cardiopulmonary disease. Minimal atelectasis or scarring at the lingula and left base. Electronically Signed   By: Jasmine Pang M.D.   On: 04/29/2023 18:47    Wendie Agreste, MD - 04/01/2023  Formatting of this note might be different from the original. EXAM: CT CHEST W CONTRAST ACCESSION: [id number] UN REPORT DATE: 04/01/2023 12:18 PM  CLINICAL INDICATION: pneumonia and chest mass history - J18.9 - Community acquired pneumonia, unspecified laterality - R93.89 - Abnormal CXR  TECHNIQUE: Contiguous axial images were reconstructed through the chest following a single breath hold helical acquisition during the administration of intravenous contrast material. Images were reformatted in the axial, coronal, and sagittal planes. MIP slabs were also constructed.  COMPARISON: None  FINDINGS:  LUNGS, AIRWAYS, AND PLEURA: Scattered micronodules. Subtle mosaic attenuation. Impacted small airways in bilateral lower lobe. Central airways are patent. No pleural effusion or pneumothorax.  MEDIASTINUM AND LYMPH NODES: No enlarged intrathoracic, axillary, or supraclavicular lymph nodes. Small sliding hiatal hernia.  HEART AND VASCULATURE: Cardiac chambers are normal in size. Trace pericardial effusion. Aorta is normal in caliber. Main pulmonary artery is  normal in size.  CHEST WALL AND BONES: No chest wall abnormalities. Moderate degenerative changes of the thoracic spine.  UPPER ABDOMEN: Unremarkable.  OTHER: No other findings.  IMPRESSION:  No pneumonia or malignancy involving thorax  Subtle mosaic attenuation with impacted small airways in bilateral lower lobe with associated  small sliding hiatal hernia. These findings which may reflect aspiration.   Procedures Procedures (including critical care time)  Medications Ordered in UC Medications  dexamethasone (DECADRON) injection 10 mg (has no administration in time range)  ipratropium-albuterol (DUONEB) 0.5-2.5 (3) MG/3ML nebulizer solution 3 mL (3 mLs Nebulization Given 04/29/23 1820)     Initial Impression / Assessment and Plan / UC Course  I have reviewed the triage vital signs and the nursing notes.  Pertinent labs & imaging results that were available during my care of the patient were reviewed by me and considered in my medical decision making (see chart for details).   68 year old female history of hypertension, diabetes and asthma presents for 2-week history of cough, congestion, shortness of breath/wheezing, fatigue and chest tightness.  No sick contacts.  No relief with albuterol nebulizer at home and will need refill.  History of pneumonia at the end of November.  Treated with Augmentin with medical plan.  CT scan performed about a month ago was negative for pneumonia.  Initial BP is 177/85. Re-check is 128/76.  Pulse elevated at 101 bpm.  Patient having mild tori distress and audible wheezing.  Oxygen ranges from 94 to 97%.  She is overall well-appearing.  On exam she does have nasal congestion and scattered wheezes throughout.  Patient given DuoNeb for wheezing and shortness of breath due to asthma exacerbation.  Patient reported being able to breathe better.  Offered a second nebulizer treatment but she declines.  Refilled albuterol inhaler and solution for nebulizer.   Advised to perform breathing treatments at home.  Chest x-ray performed today shows no evidence of pneumonia.  Discussed this with patient.  Acute bronchitis and asthma exacerbation.  Will treat at this time with prednisone taper to begin tomorrow.  She was given 10 mg IM dexamethasone in clinic tonight.  Also sent doxycycline and Promethazine DM. Work note given.  Reviewed return precautions.   Final Clinical Impressions(s) / UC Diagnoses   Final diagnoses:  Shortness of breath  Asthma with acute exacerbation, unspecified asthma severity, unspecified whether persistent  Acute cough  Acute bronchitis, unspecified organism     Discharge Instructions      -Sin neumona. -Comenzar la reduccin gradual de prednisona -Utilice un inhalador o nebulizador para las sibilancias y la dificultad para respirar.  -Tambin le envi medicamento para la tos y un antibitico. -Si tiene fiebre o [employer] sntomas, regrese o vaya a Sports administrator.  -No pneumonia. -Begin prednisone taper -Use inhaler or nebulizer for wheezing and breathing difficulty.  -I also sent cough medicine and an antibiotic -If fever or worsening symptoms, please return or go to the ER.      ED Prescriptions     Medication Sig Dispense Auth. Provider   albuterol (VENTOLIN HFA) 108 (90 Base) MCG/ACT inhaler Inhale 1-2 puffs into the lungs every 6 (six) hours as needed for wheezing or shortness of breath. 1 g Eusebio Friendly B, PA-C   albuterol (PROVENTIL) (2.5 MG/3ML) 0.083% nebulizer solution Take 3 mLs (2.5 mg total) by nebulization every 6 (six) hours as needed. 75 mL Eusebio Friendly B, PA-C   predniSONE (DELTASONE) 10 MG tablet Take 6 tablets on day 1 and decrease by 1 tablet daily until complete 21 tablet Shirlee Latch, PA-C   promethazine-dextromethorphan (PROMETHAZINE-DM) 6.25-15 MG/5ML syrup Take 5 mLs by mouth 4 (four) times daily as needed. 118 mL Eusebio Friendly B, PA-C   doxycycline (VIBRAMYCIN) 100 MG capsule  Take 1 capsule (100 mg total) by mouth  2 (two) times daily for 7 days. 14 capsule Shirlee Latch, PA-C      PDMP not reviewed this encounter.     Shirlee Latch, PA-C 04/29/23 1900

## 2023-04-29 NOTE — Discharge Instructions (Signed)
-  Sin neumona. -Comenzar la reduccin gradual de prednisona -Utilice un inhalador o nebulizador para las sibilancias y la dificultad para respirar.  -Tambin le envi medicamento para la tos y un antibitico. -Si tiene fiebre o [employer] sntomas, regrese o vaya a Sports administrator.  -No pneumonia. -Begin prednisone taper -Use inhaler or nebulizer for wheezing and breathing difficulty.  -I also sent cough medicine and an antibiotic -If fever or worsening symptoms, please return or go to the ER.

## 2023-08-05 ENCOUNTER — Ambulatory Visit (INDEPENDENT_AMBULATORY_CARE_PROVIDER_SITE_OTHER)

## 2023-08-05 ENCOUNTER — Ambulatory Visit: Admission: EM | Admit: 2023-08-05 | Discharge: 2023-08-05 | Disposition: A | Discharge status: Home / Self Care

## 2023-08-05 DIAGNOSIS — J45901 Unspecified asthma with (acute) exacerbation: Secondary | ICD-10-CM | POA: Diagnosis not present

## 2023-08-05 DIAGNOSIS — R0602 Shortness of breath: Secondary | ICD-10-CM

## 2023-08-05 DIAGNOSIS — R052 Subacute cough: Secondary | ICD-10-CM | POA: Diagnosis not present

## 2023-08-05 DIAGNOSIS — J209 Acute bronchitis, unspecified: Secondary | ICD-10-CM | POA: Diagnosis not present

## 2023-08-05 MED ORDER — PREDNISONE 20 MG PO TABS: ORAL_TABLET | ORAL | 0 refills | Status: AC

## 2023-08-05 MED ORDER — DEXAMETHASONE SODIUM PHOSPHATE 10 MG/ML IJ SOLN
10.0000 mg | Freq: Once | INTRAMUSCULAR | Status: AC
  Administered 2023-08-05: 10 mg via INTRAMUSCULAR

## 2023-08-05 MED ORDER — PROMETHAZINE-DM 6.25-15 MG/5ML PO SYRP: 5.0000 mL | ORAL_SOLUTION | Freq: Four times a day (QID) | ORAL | 0 refills | Status: AC | PRN

## 2023-08-05 MED ORDER — IPRATROPIUM-ALBUTEROL 0.5-2.5 (3) MG/3ML IN SOLN: 3.0000 mL | Freq: Four times a day (QID) | RESPIRATORY_TRACT | 0 refills | Status: AC | PRN

## 2023-08-05 MED ORDER — IPRATROPIUM-ALBUTEROL 0.5-2.5 (3) MG/3ML IN SOLN
3.0000 mL | Freq: Once | RESPIRATORY_TRACT | Status: AC
  Administered 2023-08-05: 3 mL via RESPIRATORY_TRACT

## 2023-08-05 NOTE — ED Provider Notes (Signed)
 [facility]    CSN: [id number] Arrival date & time: 08/05/23  1554      History   Chief Complaint Chief Complaint  Patient presents with   Shortness of Breath    HPI Olivia Ramos is a 68 y.o. female with history of asthma, diabetes and hypertension.  She presents today for 4 week history of fatigue, cough, congestion, chest tightness, wheezing and shortness of breath.  Patient has been using albuterol  neb without relief. She denies any fevers, diarrhea, or weakness. No sick contacts to her knowledge.  No OTC meds taken.  Of note, patient was seen by PCP on 07/29/23 and given Augmentin x 10 days and azithromycin  x 5 days for "bacterial bronchitis."  Patient was also treated with Medrol Dosepak, albuterol  inhaler and Advair.  She followed up with PCP on 08/02/23. Has been using albuterol  neb once daily and albuterol  inhaler 3-4 times per day. Patient declines interpreter. She has a family member present helping to translate.  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      Home Medications    Prior  to Admission medications   Medication Sig Start Date End Date Taking? Authorizing Provider  albuterol  (PROVENTIL ) (2.5 MG/3ML) 0.083% nebulizer solution Take 3 mLs (2.5 mg total) by nebulization every 6 (six) hours as needed. 04/29/23  Yes Floydene Hy, PA-C  albuterol  (VENTOLIN  HFA) 108 (90 Base) MCG/ACT inhaler Inhale 1-2 puffs into the lungs every 6 (six) hours as needed for wheezing or shortness of breath. 04/29/23  Yes Floydene Hy, PA-C  amoxicillin-clavulanate (AUGMENTIN) 875-125 MG tablet Take 1 tablet by mouth. 07/29/23 08/08/23 Yes [provider]  atorvastatin  (LIPITOR) 80 MG tablet Take 1 tablet (80 mg total) by mouth daily. 04/04/18  Yes Clarise Crooks, MD  Dulaglutide (TRULICITY) 1.5 MG/0.5ML SOPN Inject into the skin. 09/16/21  Yes [provider]  empagliflozin (JARDIANCE) 10 MG TABS tablet Take 1 tablet by mouth daily. 09/12/21  Yes [provider]  Insulin  Glargine (BASAGLAR  KWIKPEN) 100 UNIT/ML SOPN INJECT 0.35 MLS (35 UNITS TOTAL) INTO THE SKIN DAILY. 09/20/18  Yes Clarise Crooks, MD  ipratropium-albuterol  (DUONEB) 0.5-2.5 (3) MG/3ML SOLN Take 3 mLs by nebulization every 6 (six) hours as needed (wheezing and shortness of breath). 08/05/23  Yes Nancy Axon B, PA-C  losartan  (COZAAR ) 50 MG tablet Take 1 tablet (50 mg total) by mouth daily. 04/04/18  Yes Clarise Crooks, MD  metFORMIN  (GLUCOPHAGE ) 1000 MG tablet TAKE 1 TABLET (1,000 MG TOTAL) BY MOUTH 2 (TWO) TIMES DAILY WITH A MEAL. 10/03/18  Yes Rochelle Chu,  Deanna C, MD  predniSONE  (DELTASONE ) 20 MG tablet Take 5 tabs po x 2 days, 4 tabs x 2 days, 3 tabs x 2 days, 2 tabs x 2 days, 1 tab x 2 days 08/05/23  Yes Nancy Axon B, PA-C  promethazine -dextromethorphan (PROMETHAZINE -DM) 6.25-15 MG/5ML syrup Take 5 mLs by mouth 4 (four) times daily as needed. 08/05/23  Yes Nancy Axon B, PA-C  acetaminophen  (TYLENOL ) 325 MG tablet Take by mouth.    [provider]  atorvastatin  (LIPITOR) 80 MG tablet Take by mouth.  05/22/21 05/22/22  [provider]  benzonatate  (TESSALON ) 200 MG capsule Take 1 capsule (200 mg total) by mouth 3 (three) times daily as needed for cough. 10/24/22   Floydene Hy, PA-C  famotidine-calcium  carbonate-magnesium hydroxide (PEPCID COMPLETE) 10-800-165 MG chewable tablet Chew 1 tablet by mouth daily as needed.    [provider]  Fluticasone -Salmeterol (ADVAIR) 250-50 MCG/DOSE AEPB Inhale 1 puff into the lungs 2 (two) times daily. 04/04/18   Clarise Crooks, MD  glucose blood (FREESTYLE LITE) test strip Use as instructed 04/04/18   Clarise Crooks, MD  glucose monitoring kit (FREESTYLE) monitoring kit 1 each by Does not apply route daily. Check glucose once in the morning before breakfast and 1 hour after a meal 04/04/18   Clarise Crooks, MD  Insulin  Pen Needle (PEN NEEDLES) 31G X 8 MM MISC Use as directed 04/04/18   Clarise Crooks, MD  Lancets (FREESTYLE) lancets Use as instructed 04/04/18   Clarise Crooks, MD  losartan  (COZAAR ) 50 MG tablet Take 1 tablet by mouth daily. 08/29/21   [provider]  meloxicam (MOBIC) 15 MG tablet Take 15 mg by mouth daily. 10/24/21   [provider]  metFORMIN  (GLUCOPHAGE ) 1000 MG tablet Take by mouth. 06/17/21 06/18/22  [provider]    Family History Family History  Problem Relation Age of Onset   Heart attack Mother    Diabetes Mother    Hypertension Father    Cancer Sister        breast   Heart attack Brother    Stroke Sister    Breast cancer Neg Hx     Social History Social History   Tobacco Use   Smoking status: Never   Smokeless tobacco: Never  Vaping Use   Vaping status: Never Used  Substance Use Topics   Alcohol use: No    Alcohol/week: 0.0 standard drinks of alcohol   Drug use: No     Allergies   Soy allergy (obsolete) and Lorazepam    Review of Systems Review of Systems  Constitutional:  Positive for fatigue. Negative for chills, diaphoresis and fever.  HENT:  Positive  for congestion. Negative for ear pain, rhinorrhea, sinus pressure, sinus pain and sore throat.   Respiratory:  Positive for cough, chest tightness, shortness of breath and wheezing.   Cardiovascular:  Negative for chest pain.  Gastrointestinal:  Negative for abdominal pain, nausea and vomiting.  Musculoskeletal:  Negative for arthralgias and myalgias.  Skin:  Negative for rash.  Neurological:  Negative for dizziness, weakness and headaches.  Hematological:  Negative for adenopathy.     Physical Exam Triage Vital Signs   Updated Vital Signs BP (!) 146/81 (BP Location: Right Arm)   Pulse 94   Temp 98.4 F (36.9 C) (Oral)   Resp 16   SpO2 96%     Physical Exam Vitals and nursing note reviewed.  Constitutional:      General: She is not  in acute distress.    Appearance: Normal appearance. She is not ill-appearing or toxic-appearing.  HENT:     Head: Normocephalic and atraumatic.     Nose: Congestion present.     Mouth/Throat:     Mouth: Mucous membranes are moist.     Pharynx: Oropharynx is clear. No posterior oropharyngeal erythema.  Eyes:     General: No scleral icterus.       Right eye: No discharge.        Left eye: No discharge.     Conjunctiva/sclera: Conjunctivae normal.  Cardiovascular:     Rate and Rhythm: Normal rate and regular rhythm.     Heart sounds: Normal heart sounds.  Pulmonary:     Effort: Pulmonary effort is normal. No respiratory distress.     Breath sounds: Wheezing (scattered wheezes) and rhonchi present.  Musculoskeletal:     Cervical back: Neck supple.  Skin:    General: Skin is dry.  Neurological:     General: No focal deficit present.     Mental Status: She is alert. Mental status is at baseline.     Motor: No weakness.     Gait: Gait normal.  Psychiatric:        Mood and Affect: Mood normal.        Behavior: Behavior normal.      UC Treatments / Results  Labs (all labs ordered are listed, but only abnormal results are  displayed) Labs Reviewed - No data to display   EKG   Radiology DG Chest 2 View Result Date: 08/05/2023 CLINICAL DATA:  Cough for 4 weeks.  Wheezing.  Asthma. EXAM: CHEST - 2 VIEW COMPARISON:  None Available. FINDINGS: The heart size and mediastinal contours are within normal limits. Stable mild scarring in left lung base. The lungs are otherwise clear. The visualized skeletal structures are unremarkable. IMPRESSION: No active cardiopulmonary disease. Electronically Signed   By: Marlyce Sine M.D.   On: 08/05/2023 16:41     Melony Squibb, MD - 04/01/2023  Formatting of this note might be different from the original. EXAM: CT CHEST W CONTRAST ACCESSION: [id number] UN REPORT DATE: 04/01/2023 12:18 PM  CLINICAL INDICATION: pneumonia and chest mass history - J18.9 - Community acquired pneumonia, unspecified laterality - R93.89 - Abnormal CXR  TECHNIQUE: Contiguous axial images were reconstructed through the chest following a single breath hold helical acquisition during the administration of intravenous contrast material. Images were reformatted in the axial, coronal, and sagittal planes. MIP slabs were also constructed.  COMPARISON: None  FINDINGS:  LUNGS, AIRWAYS, AND PLEURA: Scattered micronodules. Subtle mosaic attenuation. Impacted small airways in bilateral lower lobe. Central airways are patent. No pleural effusion or pneumothorax.  MEDIASTINUM AND LYMPH NODES: No enlarged intrathoracic, axillary, or supraclavicular lymph nodes. Small sliding hiatal hernia.  HEART AND VASCULATURE: Cardiac chambers are normal in size. Trace pericardial effusion. Aorta is normal in caliber. Main pulmonary artery is normal in size.  CHEST WALL AND BONES: No chest wall abnormalities. Moderate degenerative changes of the thoracic spine.  UPPER ABDOMEN: Unremarkable.  OTHER: No other findings.  IMPRESSION:  No pneumonia or malignancy involving thorax  Subtle mosaic attenuation with impacted  small airways in bilateral lower lobe with associated small sliding hiatal hernia. These findings which may reflect aspiration.   Procedures Procedures (including critical care time)  Medications Ordered in UC Medications  dexamethasone  (DECADRON ) injection 10 mg (10 mg Intramuscular Given 08/05/23 1638)  ipratropium-albuterol  (DUONEB) 0.5-2.5 (3) MG/3ML nebulizer solution 3 mL (3 mLs  Nebulization Given 08/05/23 1639)      Initial Impression / Assessment and Plan / UC Course  I have reviewed the triage vital signs and the nursing notes.  Pertinent labs & imaging results that were available during my care of the patient were reviewed by me and considered in my medical decision making (see chart for details).   68 year old female history of hypertension, diabetes and asthma presents for 4-week history of cough, congestion, shortness of breath/wheezing, fatigue and chest tightness.  No sick contacts.  No relief with albuterol  nebulizer at home.  Treated with Augmentin and azithromycin , medrol dosepack, Advair and albuterol  by PCP 1 week ago. Still on Augmentin.  Initial BP is 159/81. Re-check is 146/81.  Pulse normal.  Patient in NAD. Oxygen 96%.  She is overall well-appearing.  On exam she does have nasal congestion and scattered wheezes/rhonchi throughout.  Patient given DuoNeb for wheezing and shortness of breath due to asthma exacerbation.  Patient reported being able to breathe better.  Offered a second nebulizer treatment but she declines.  Sent duoneb solution for nebulizer.  Advised to perform breathing treatments at home.  Chest x-ray performed today shows no evidence of pneumonia.  Discussed this with patient.  Acute bronchitis and asthma exacerbation.  Will treat at this time with prednisone  taper to begin tomorrow.  She was given 10 mg IM dexamethasone  in clinic tonight.  Also sent Promethazine  DM. Work note given.  Reviewed return precautions.   Final Clinical Impressions(s) / UC  Diagnoses   Final diagnoses:  Subacute cough  Acute bronchitis, unspecified organism  Exacerbation of persistent asthma, unspecified asthma severity  SOB (shortness of breath)     Discharge Instructions      -Sin neumona. -Comenzar la reduccin gradual de prednisona -Utilice un inhalador o nebulizador para las sibilancias y la dificultad para respirar.  -Tambin le envi medicamento para la tos y un antibitico. -Si tiene fiebre o [employer] sntomas, regrese o vaya a Sports administrator.   -No pneumonia. -Begin prednisone  taper -Use inhaler or nebulizer for wheezing and breathing difficulty.  -I also sent cough medicine and an antibiotic -If fever or worsening symptoms, please return or go to the ER.       ED Prescriptions     Medication Sig Dispense Auth. Provider   predniSONE  (DELTASONE ) 20 MG tablet Take 5 tabs po x 2 days, 4 tabs x 2 days, 3 tabs x 2 days, 2 tabs x 2 days, 1 tab x 2 days 30 tablet Nancy Axon B, PA-C   promethazine -dextromethorphan (PROMETHAZINE -DM) 6.25-15 MG/5ML syrup Take 5 mLs by mouth 4 (four) times daily as needed. 118 mL Nancy Axon B, PA-C   ipratropium-albuterol  (DUONEB) 0.5-2.5 (3) MG/3ML SOLN Take 3 mLs by nebulization every 6 (six) hours as needed (wheezing and shortness of breath). 360 mL Floydene Hy, PA-C      PDMP not reviewed this encounter.        Floydene Hy, PA-C 08/05/23 1655

## 2023-08-05 NOTE — ED Triage Notes (Addendum)
 Sx  x 4 weeks.  Hx of asthma using inhaler.  Cough SOB  Wheezing  Patient seen PCP on 4-22  and was prednisone  and Augmentin

## 2023-08-05 NOTE — Discharge Instructions (Signed)
-  Sin neumona. -Comenzar la reduccin gradual de prednisona -Utilice un inhalador o nebulizador para las sibilancias y la dificultad para respirar.  -Tambin le envi medicamento para la tos y un antibitico. -Si tiene fiebre o [employer] sntomas, regrese o vaya a Sports administrator.  -No pneumonia. -Begin prednisone taper -Use inhaler or nebulizer for wheezing and breathing difficulty.  -I also sent cough medicine and an antibiotic -If fever or worsening symptoms, please return or go to the ER.

## 2023-10-14 ENCOUNTER — Ambulatory Visit: Admission: EM | Admit: 2023-10-14 | Discharge: 2023-10-14 | Disposition: A | Discharge status: Home / Self Care

## 2023-10-14 DIAGNOSIS — J020 Streptococcal pharyngitis: Secondary | ICD-10-CM | POA: Diagnosis present

## 2023-10-14 LAB — GROUP A STREP BY PCR: Group A Strep by PCR: NOT DETECTED

## 2023-10-14 MED ORDER — AMOXICILLIN 500 MG PO CAPS: 500.0000 mg | ORAL_CAPSULE | Freq: Two times a day (BID) | ORAL | 0 refills | Status: AC

## 2023-10-14 MED ORDER — CLOTRIMAZOLE 10 MG MT TROC: 10.0000 mg | Freq: Every day | OROMUCOSAL | 0 refills | Status: AC

## 2023-10-14 NOTE — ED Triage Notes (Signed)
 Sore throat x 2 days. Taking tylenol .   Pt states she is having ringing in her ears x 3 months.

## 2023-10-14 NOTE — Discharge Instructions (Signed)
  1. Strep pharyngitis (Primary) - Group A Strep by PCR performed in UC is negative for strep pharyngitis.  However, based on presentation with severe pharyngeal erythema with prominent white exudates empiric treatment with amoxicillin  will be initiated.  Also treating with clotrimazole  for possible Candida. - amoxicillin  (AMOXIL ) 500 MG capsule; Take 1 capsule (500 mg total) by mouth 2 (two) times daily for 10 days.  Dispense: 20 capsule; Refill: 0 - clotrimazole  (MYCELEX ) 10 MG troche; Take 1 tablet (10 mg total) by mouth 5 (five) times daily for 10 days.  Dispense: 50 tablet; Refill: 0 -Continue to monitor symptoms for any change in severity if there is any escalation of current symptoms or development of new symptoms follow-up in ER for further evaluation and management.

## 2023-10-14 NOTE — ED Provider Notes (Signed)
 [facility]  Note:  This document was prepared using Conservation officer, historic buildings and may include unintentional dictation errors.  MRN: [id number] DOB: [birth year]  Subjective:   Olivia Ramos is a 68 y.o. female presenting for severe sore throat x 2 days.  Patient has been taking Tylenol  with minimal improvement.  Denies any known sick contacts no recent exposure to strep.  No fever, body aches, fatigue, shortness of breath, chest pain, weakness, dizziness.  Patient also reports that she has had bilateral tinnitus x 3 months.  Denies any other secondary symptoms  No current facility-administered medications for this encounter.  Current Outpatient Medications:    amoxicillin  (AMOXIL ) 500 MG capsule, Take 1 capsule (500 mg total) by mouth 2 (two) times daily for 10 days., Disp: 20 capsule, Rfl: 0   atorvastatin  (LIPITOR) 80 MG tablet, Take 1 tablet (80 mg total) by mouth daily., Disp: 90 tablet, Rfl: 1   clotrimazole  (MYCELEX ) 10 MG troche, Take 1 tablet (10 mg total) by mouth 5 (five) times daily for 10 days., Disp: 50 tablet, Rfl: 0   Dulaglutide (TRULICITY) 1.5 MG/0.5ML SOPN, Inject into the skin., Disp: , Rfl:    empagliflozin (JARDIANCE) 10 MG TABS tablet, Take 1 tablet by mouth daily., Disp: , Rfl:    famotidine-calcium  carbonate-magnesium hydroxide (PEPCID COMPLETE) 10-800-165 MG chewable tablet, Chew 1 tablet by mouth daily as needed., Disp: , Rfl:    Fluticasone -Salmeterol (ADVAIR) 250-50 MCG/DOSE AEPB, Inhale 1 puff into the lungs 2 (two) times daily., Disp: 60 each, Rfl: 5   Insulin  Glargine (BASAGLAR  KWIKPEN) 100 UNIT/ML SOPN, INJECT 0.35 MLS (35 UNITS TOTAL) INTO THE SKIN DAILY., Disp: 1 pen, Rfl: 0   losartan  (COZAAR ) 50 MG tablet, Take 1 tablet (50 mg total) by mouth daily., Disp: 90 tablet, Rfl: 1   metFORMIN  (GLUCOPHAGE ) 1000 MG tablet, TAKE 1 TABLET (1,000 MG TOTAL) BY MOUTH 2 (TWO) TIMES DAILY WITH A MEAL., Disp: 60 tablet, Rfl: 0   acetaminophen  (TYLENOL )  325 MG tablet, Take by mouth., Disp: , Rfl:    albuterol  (PROVENTIL ) (2.5 MG/3ML) 0.083% nebulizer solution, Take 3 mLs (2.5 mg total) by nebulization every 6 (six) hours as needed., Disp: 75 mL, Rfl: 0   albuterol  (VENTOLIN  HFA) 108 (90 Base) MCG/ACT inhaler, Inhale 1-2 puffs into the lungs every 6 (six) hours as needed for wheezing or shortness of breath., Disp: 1 g, Rfl: 1   atorvastatin  (LIPITOR) 80 MG tablet, Take by mouth., Disp: , Rfl:    benzonatate  (TESSALON ) 200 MG capsule, Take 1 capsule (200 mg total) by mouth 3 (three) times daily as needed for cough., Disp: 30 capsule, Rfl: 0   glucose blood (FREESTYLE LITE) test strip, Use as instructed, Disp: 100 each, Rfl: 3   glucose monitoring kit (FREESTYLE) monitoring kit, 1 each by Does not apply route daily. Check glucose once in the morning before breakfast and 1 hour after a meal, Disp: 1 each, Rfl: 0   Insulin  Pen Needle (PEN NEEDLES) 31G X 8 MM MISC, Use as directed, Disp: 100 each, Rfl: 5   ipratropium-albuterol  (DUONEB) 0.5-2.5 (3) MG/3ML SOLN, Take 3 mLs by nebulization every 6 (six) hours as needed (wheezing and shortness of breath)., Disp: 360 mL, Rfl: 0   Lancets (FREESTYLE) lancets, Use as instructed, Disp: 100 each, Rfl: 12   losartan  (COZAAR ) 50 MG tablet, Take 1 tablet by mouth daily., Disp: , Rfl:    meloxicam (MOBIC) 15 MG tablet, Take 15 mg by mouth daily., Disp: , Rfl:  metFORMIN  (GLUCOPHAGE ) 1000 MG tablet, Take by mouth., Disp: , Rfl:    predniSONE  (DELTASONE ) 20 MG tablet, Take 5 tabs po x 2 days, 4 tabs x 2 days, 3 tabs x 2 days, 2 tabs x 2 days, 1 tab x 2 days, Disp: 30 tablet, Rfl: 0   promethazine -dextromethorphan (PROMETHAZINE -DM) 6.25-15 MG/5ML syrup, Take 5 mLs by mouth 4 (four) times daily as needed., Disp: 118 mL, Rfl: 0   Allergies  Allergen Reactions   Soy Allergy (Obsolete) Anaphylaxis and Hives    Difficulty breathing   Lorazepam      Past Medical History:  Diagnosis Date   Arthritis    Asthma     Diabetes mellitus without complication (HCC)    Hypertension      Past Surgical History:  Procedure Laterality Date   CESAREAN SECTION     x 3    Family History  Problem Relation Age of Onset   Heart attack Mother    Diabetes Mother    Hypertension Father    Cancer Sister        breast   Heart attack Brother    Stroke Sister    Breast cancer Neg Hx     Social History   Tobacco Use   Smoking status: Never   Smokeless tobacco: Never  Vaping Use   Vaping status: Never Used  Substance Use Topics   Alcohol use: No    Alcohol/week: 0.0 standard drinks of alcohol   Drug use: No    ROS Refer to HPI for ROS details.  Objective:   Vitals: BP (!) 174/81 (BP Location: Right Arm)   Pulse 92   Temp 98.4 F (36.9 C) (Oral)   Resp 16   SpO2 94%   Physical Exam Vitals and nursing note reviewed.  Constitutional:      General: She is not in acute distress.    Appearance: She is well-developed. She is not ill-appearing or toxic-appearing.  HENT:     Head: Normocephalic and atraumatic.     Nose: No congestion or rhinorrhea.     Mouth/Throat:     Mouth: Mucous membranes are moist.     Pharynx: Oropharyngeal exudate and posterior oropharyngeal erythema present.  Eyes:     General:        Right eye: No discharge.        Left eye: No discharge.     Extraocular Movements: Extraocular movements intact.     Conjunctiva/sclera: Conjunctivae normal.  Cardiovascular:     Rate and Rhythm: Normal rate.  Pulmonary:     Effort: Pulmonary effort is normal. No respiratory distress.  Skin:    General: Skin is warm and dry.  Neurological:     General: No focal deficit present.     Mental Status: She is alert and oriented to person, place, and time.  Psychiatric:        Mood and Affect: Mood normal.        Behavior: Behavior normal.     Procedures  Results for orders placed or performed during the hospital encounter of 10/14/23 (from the past 24 hours)  Group A Strep by PCR      Status: None   Collection Time: 10/14/23  7:23 PM   Specimen: Throat; Sterile Swab  Result Value Ref Range   Group A Strep by PCR NOT DETECTED NOT DETECTED    Assessment and Plan :     Discharge Instructions       1. Strep pharyngitis (Primary) -  Group A Strep by PCR performed in UC is negative for strep pharyngitis.  However, based on presentation with severe pharyngeal erythema with prominent white exudates empiric treatment with amoxicillin  will be initiated.  Also treating with clotrimazole  for possible Candida. - amoxicillin  (AMOXIL ) 500 MG capsule; Take 1 capsule (500 mg total) by mouth 2 (two) times daily for 10 days.  Dispense: 20 capsule; Refill: 0 - clotrimazole  (MYCELEX ) 10 MG troche; Take 1 tablet (10 mg total) by mouth 5 (five) times daily for 10 days.  Dispense: 50 tablet; Refill: 0 -Continue to monitor symptoms for any change in severity if there is any escalation of current symptoms or development of new symptoms follow-up in ER for further evaluation and management.      Kell Ferris B Delsy Etzkorn   Jani Ploeger, [place] B, [place] 10/14/23 2004
